# Patient Record
Sex: Female | Born: 1935 | Race: White | Hispanic: No | Smoking: Never smoker
Health system: Southern US, Community
[De-identification: ages and names within clinical notes are randomized; demographics above are authoritative.]

## PROBLEM LIST (undated history)

## (undated) DIAGNOSIS — D696 Thrombocytopenia, unspecified: Secondary | ICD-10-CM

## (undated) DIAGNOSIS — E871 Hypo-osmolality and hyponatremia: Secondary | ICD-10-CM

## (undated) DIAGNOSIS — N390 Urinary tract infection, site not specified: Secondary | ICD-10-CM

## (undated) DIAGNOSIS — J180 Bronchopneumonia, unspecified organism: Secondary | ICD-10-CM

## (undated) DIAGNOSIS — E785 Hyperlipidemia, unspecified: Secondary | ICD-10-CM

## (undated) DIAGNOSIS — I639 Cerebral infarction, unspecified: Secondary | ICD-10-CM

## (undated) DIAGNOSIS — K7460 Unspecified cirrhosis of liver: Secondary | ICD-10-CM

## (undated) DIAGNOSIS — E8770 Fluid overload, unspecified: Secondary | ICD-10-CM

## (undated) DIAGNOSIS — E7800 Pure hypercholesterolemia, unspecified: Secondary | ICD-10-CM

## (undated) DIAGNOSIS — Z87440 Personal history of urinary (tract) infections: Secondary | ICD-10-CM

## (undated) DIAGNOSIS — K8020 Calculus of gallbladder without cholecystitis without obstruction: Secondary | ICD-10-CM

## (undated) DIAGNOSIS — R410 Disorientation, unspecified: Secondary | ICD-10-CM

## (undated) DIAGNOSIS — Z8673 Personal history of transient ischemic attack (TIA), and cerebral infarction without residual deficits: Secondary | ICD-10-CM

## (undated) DIAGNOSIS — I2510 Atherosclerotic heart disease of native coronary artery without angina pectoris: Secondary | ICD-10-CM

## (undated) DIAGNOSIS — I10 Essential (primary) hypertension: Secondary | ICD-10-CM

## (undated) DIAGNOSIS — K7689 Other specified diseases of liver: Secondary | ICD-10-CM

## (undated) HISTORY — DX: Fluid overload, unspecified: E87.70

## (undated) HISTORY — DX: Pure hypercholesterolemia, unspecified: E78.00

## (undated) HISTORY — DX: Atherosclerotic heart disease of native coronary artery without angina pectoris: I25.10

## (undated) HISTORY — DX: Hyperlipidemia, unspecified: E78.5

## (undated) HISTORY — DX: Other specified diseases of liver: K76.89

## (undated) HISTORY — DX: Thrombocytopenia, unspecified: D69.6

## (undated) HISTORY — DX: Hypo-osmolality and hyponatremia: E87.1

## (undated) HISTORY — DX: Cerebral infarction, unspecified: I63.9

## (undated) HISTORY — DX: Urinary tract infection, site not specified: N39.0

## (undated) HISTORY — PX: OTHER SURGICAL HISTORY: SHX169

## (undated) HISTORY — PX: TENDON REPAIR: SHX5111

## (undated) HISTORY — DX: Personal history of transient ischemic attack (TIA), and cerebral infarction without residual deficits: Z86.73

## (undated) HISTORY — DX: Disorientation, unspecified: R41.0

## (undated) HISTORY — DX: Calculus of gallbladder without cholecystitis without obstruction: K80.20

## (undated) HISTORY — DX: Bronchopneumonia, unspecified organism: J18.0

## (undated) HISTORY — DX: Essential (primary) hypertension: I10

## (undated) HISTORY — DX: Personal history of urinary (tract) infections: Z87.440

---

## 1999-09-17 ENCOUNTER — Emergency Department (HOSPITAL_COMMUNITY): Admission: EM | Admit: 1999-09-17 | Discharge: 1999-09-17 | Payer: Self-pay | Admitting: Emergency Medicine

## 1999-09-18 ENCOUNTER — Emergency Department (HOSPITAL_COMMUNITY): Admission: EM | Admit: 1999-09-18 | Discharge: 1999-09-18 | Payer: Self-pay | Admitting: Emergency Medicine

## 2005-08-16 ENCOUNTER — Other Ambulatory Visit: Admission: RE | Admit: 2005-08-16 | Discharge: 2005-08-16 | Payer: Self-pay | Admitting: Family Medicine

## 2006-09-18 HISTORY — PX: CORONARY ARTERY BYPASS GRAFT: SHX141

## 2006-10-05 ENCOUNTER — Encounter: Payer: Self-pay | Admitting: Cardiology

## 2006-10-05 ENCOUNTER — Inpatient Hospital Stay (HOSPITAL_BASED_OUTPATIENT_CLINIC_OR_DEPARTMENT_OTHER): Admission: RE | Admit: 2006-10-05 | Discharge: 2006-10-05 | Payer: Self-pay | Admitting: Cardiology

## 2006-10-05 ENCOUNTER — Ambulatory Visit (HOSPITAL_COMMUNITY): Admission: RE | Admit: 2006-10-05 | Discharge: 2006-10-05 | Payer: Self-pay | Admitting: Cardiovascular Disease

## 2006-10-05 ENCOUNTER — Ambulatory Visit: Payer: Self-pay | Admitting: Vascular Surgery

## 2006-10-05 HISTORY — PX: CARDIAC CATHETERIZATION: SHX172

## 2006-10-08 ENCOUNTER — Ambulatory Visit: Payer: Self-pay | Admitting: Thoracic Surgery (Cardiothoracic Vascular Surgery)

## 2006-10-09 ENCOUNTER — Encounter
Admission: RE | Admit: 2006-10-09 | Discharge: 2006-10-09 | Payer: Self-pay | Admitting: Thoracic Surgery (Cardiothoracic Vascular Surgery)

## 2006-10-10 ENCOUNTER — Inpatient Hospital Stay (HOSPITAL_COMMUNITY)
Admission: RE | Admit: 2006-10-10 | Discharge: 2006-10-15 | Payer: Self-pay | Admitting: Thoracic Surgery (Cardiothoracic Vascular Surgery)

## 2006-10-10 ENCOUNTER — Encounter: Payer: Self-pay | Admitting: Cardiology

## 2006-11-05 ENCOUNTER — Ambulatory Visit: Payer: Self-pay | Admitting: Thoracic Surgery (Cardiothoracic Vascular Surgery)

## 2006-11-22 ENCOUNTER — Encounter (HOSPITAL_COMMUNITY): Admission: RE | Admit: 2006-11-22 | Discharge: 2007-02-20 | Payer: Self-pay | Admitting: Cardiology

## 2007-02-21 ENCOUNTER — Encounter (HOSPITAL_COMMUNITY): Admission: RE | Admit: 2007-02-21 | Discharge: 2007-03-20 | Payer: Self-pay | Admitting: Cardiology

## 2008-03-20 DIAGNOSIS — D696 Thrombocytopenia, unspecified: Secondary | ICD-10-CM

## 2008-03-20 HISTORY — DX: Thrombocytopenia, unspecified: D69.6

## 2008-04-16 ENCOUNTER — Encounter: Admission: RE | Admit: 2008-04-16 | Discharge: 2008-04-16 | Payer: Self-pay | Admitting: Family Medicine

## 2008-06-21 ENCOUNTER — Inpatient Hospital Stay (HOSPITAL_COMMUNITY): Admission: EM | Admit: 2008-06-21 | Discharge: 2008-06-24 | Payer: Self-pay | Admitting: Emergency Medicine

## 2008-08-20 IMAGING — CR DG CHEST 2V
2 series · 2 of 2 positions shown · non-contrast
Comparison: none

CLINICAL DATA: Coronary artery disease

[view not recorded (1 of 2)]
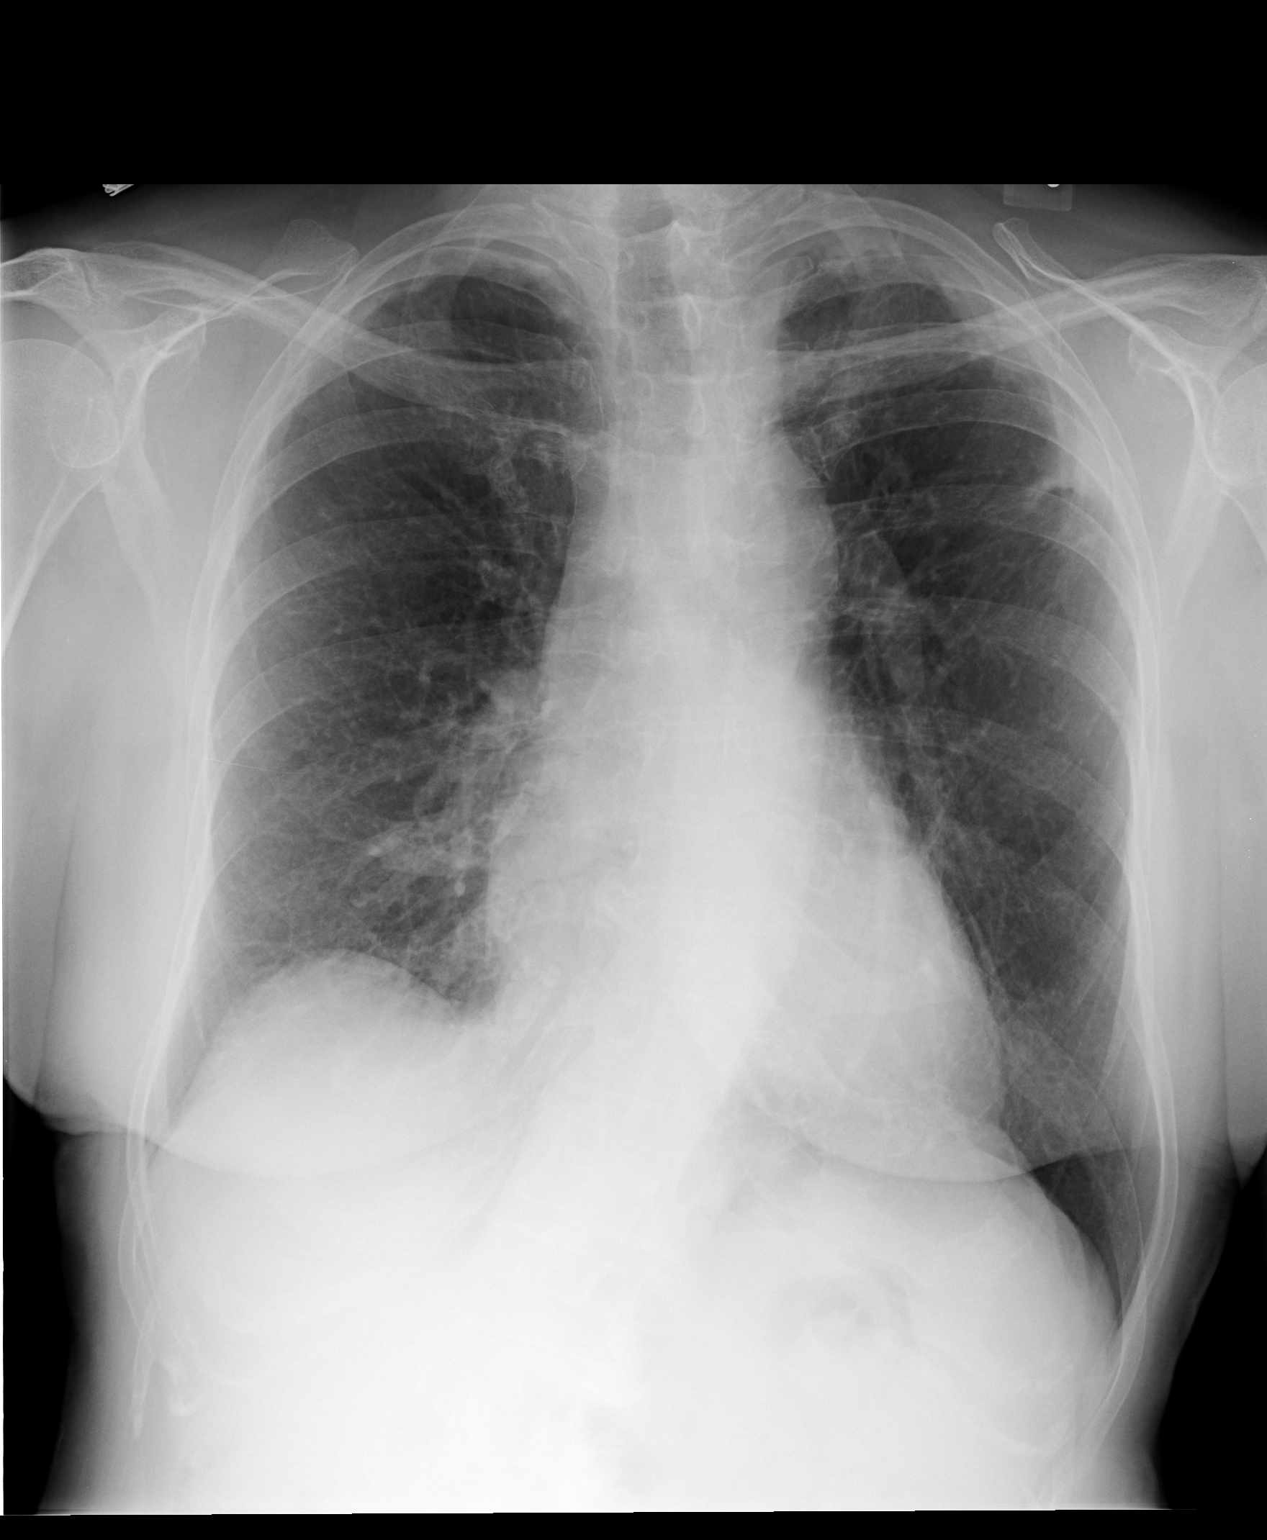

[view not recorded (2 of 2)]
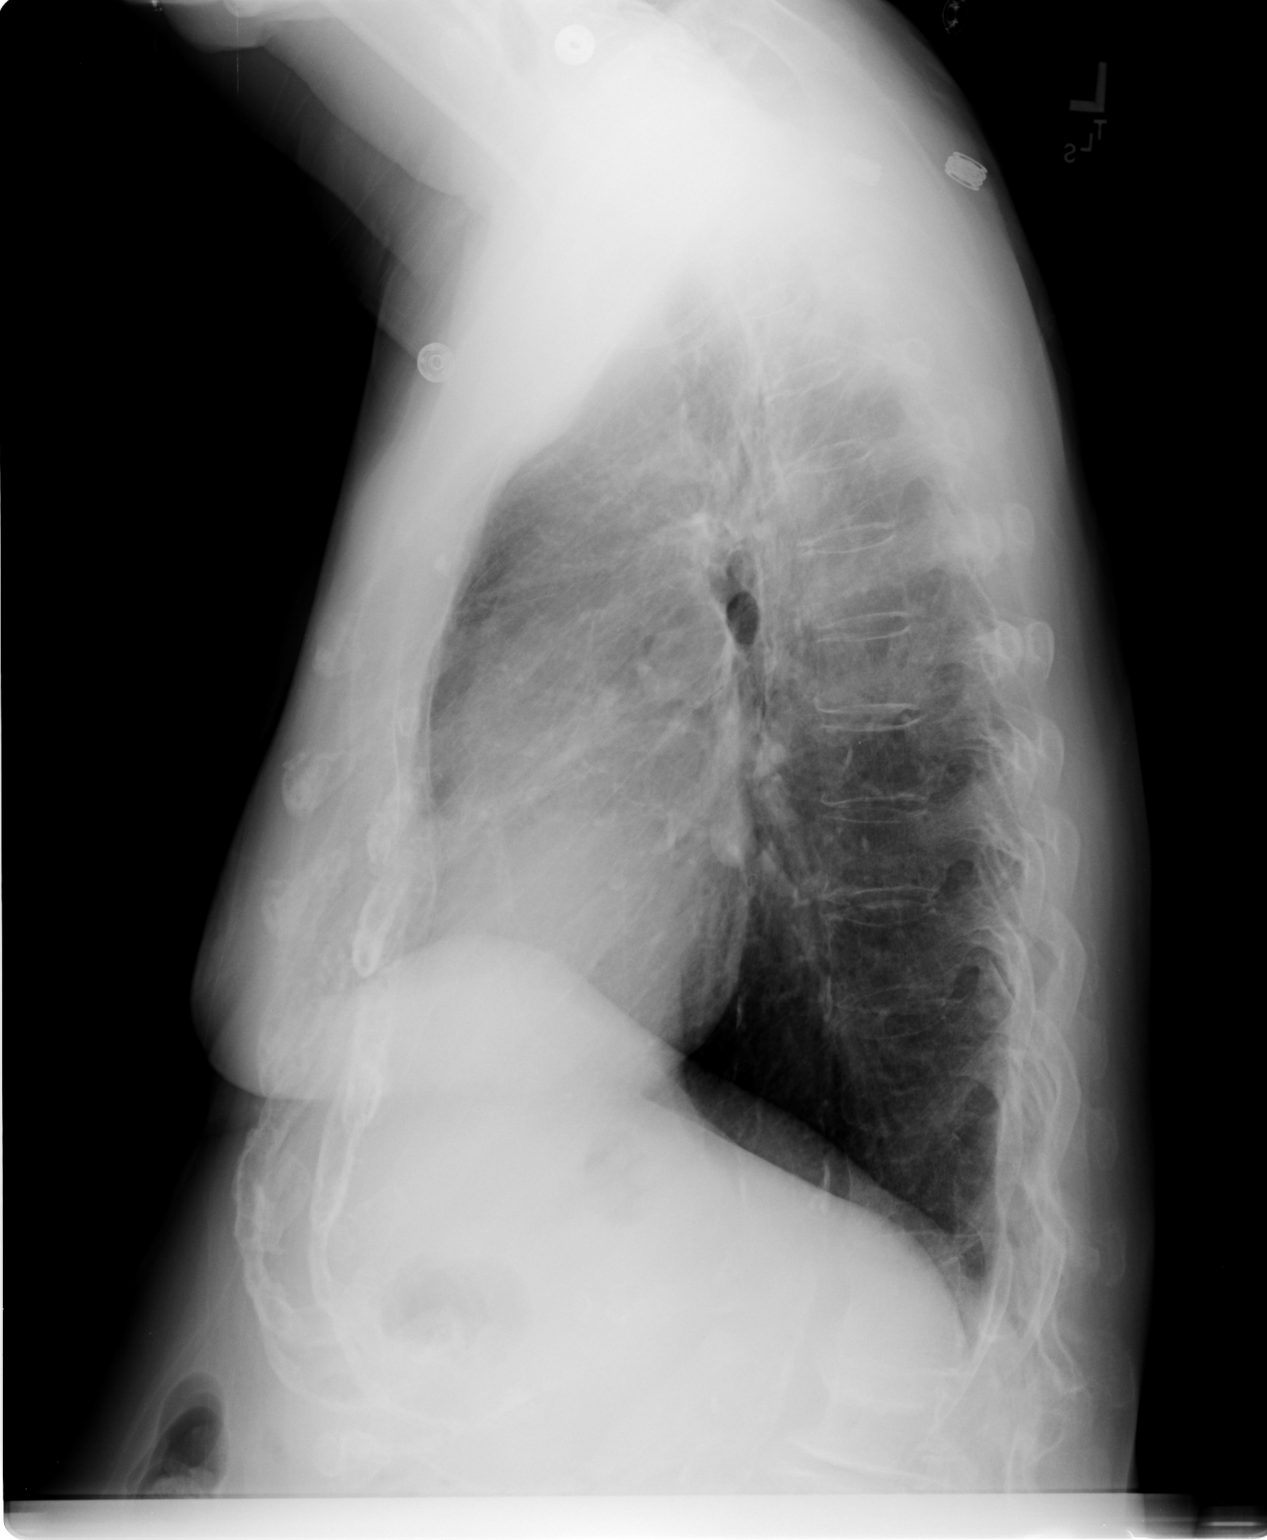

[2 of 2 positions shown; findings below may reference images not displayed]

Chest 2 view:

No previous available for comparison. There is asymmetric biapical
pleural-parenchymal thickening, left greater than right, with the process
extending along the lateral aspect of the left upper lobe with a focal more
nodular or masslike area at the level of the left 5th rib. Coarse
bronchovascular markings in the perihilar regions and lung bases. Heart size
upper limits normal. Mild levoscoliosis of the thoracic spine with degenerative
changes in the upper lumbar spine. Patchy aortic calcifications.
IMPRESSION: 1. Possible peripheral lung nodule in the left upper lobe versus asymmetric
pleural parenchymal scarring. In the absence of any previous films to
demonstrate stability, consider CT for further characterization to exclude
neoplasm.

## 2008-08-22 IMAGING — CR DG CHEST 1V PORT
1 series · 1 of 1 positions shown · non-contrast
Comparison: Preoperative chest CT 10/09/06.

CLINICAL DATA: Coronary artery disease, CABG, needle search.  
PORTABLE CHEST - 1 VIEW 10/10/06 AT 6628 HOURS:

[view not recorded]
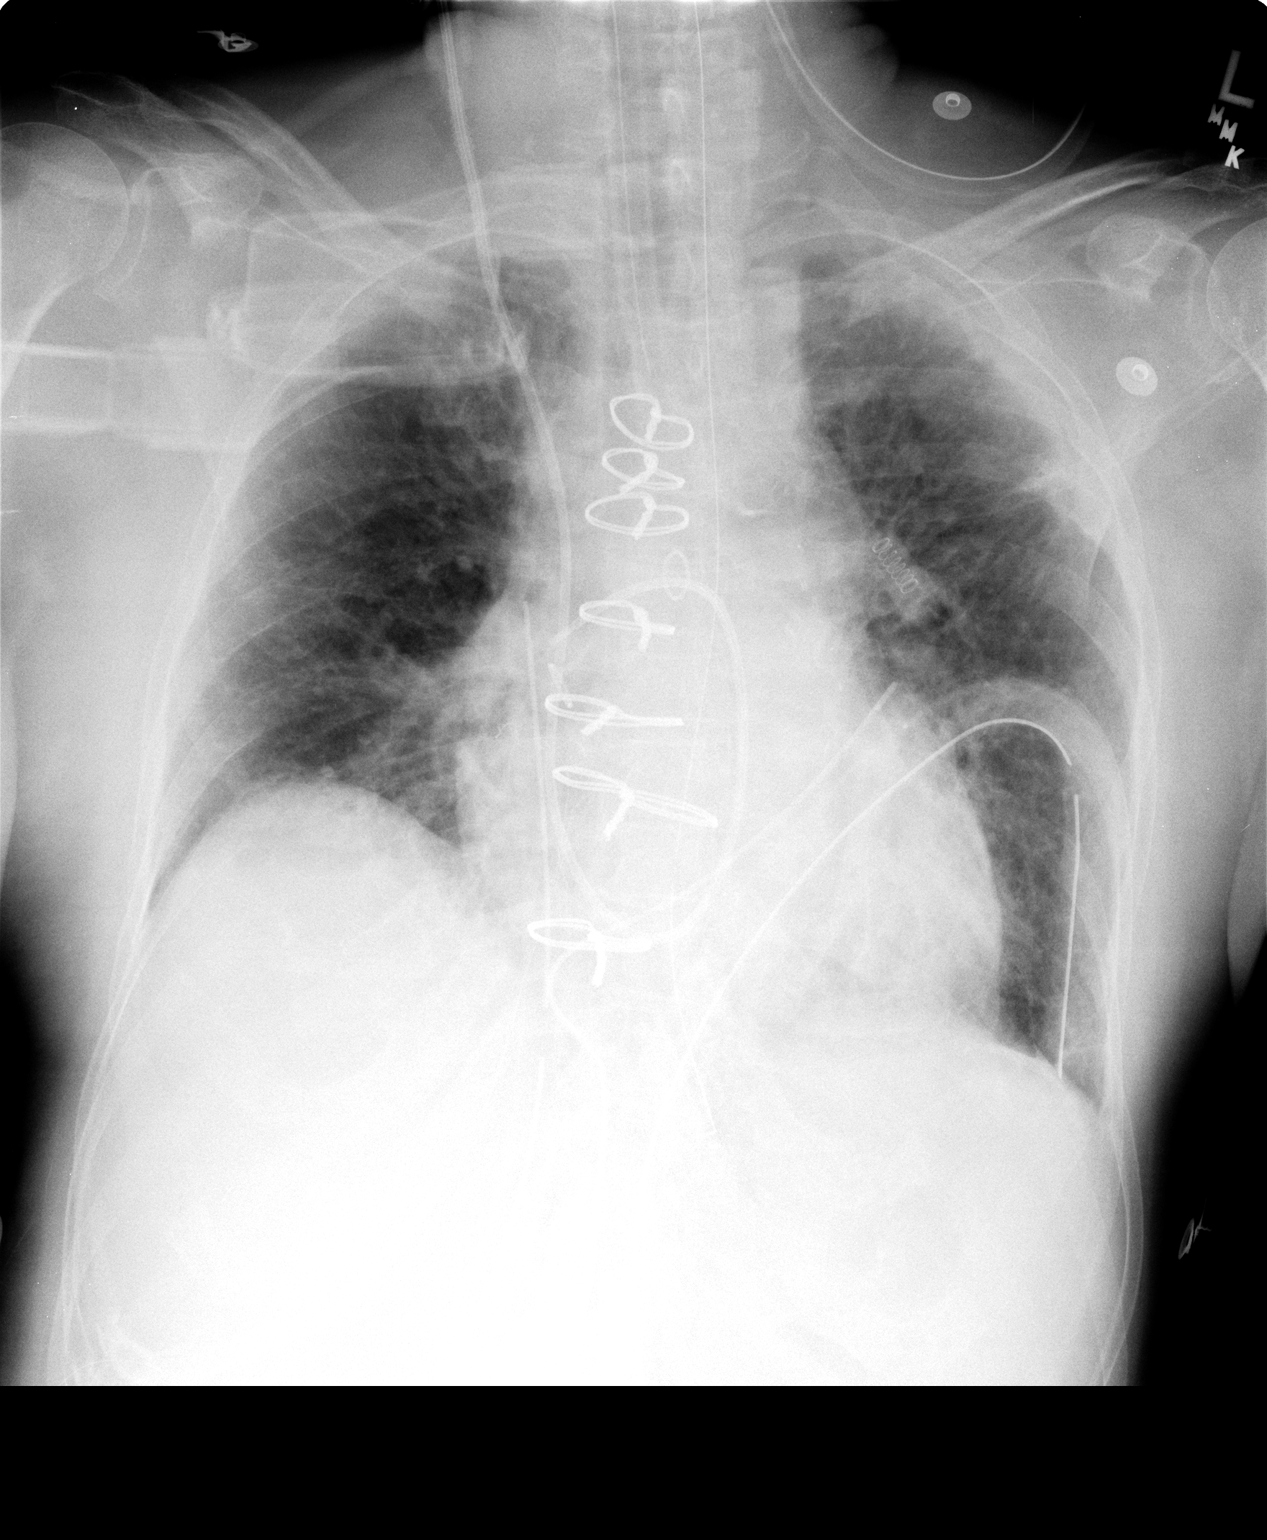

[1 of 1 positions shown; findings below may reference images not displayed]

FINDINGS: Endotracheal tube is present, with the tip approximately 2 cm from the carina.  Median sternotomy and CABG.  Right IJ Swan Ganz catheter with tip in the right pulmonary artery, heading into the descending pulmonary artery.  Nasogastric tube is present with the proximal side port in the distal esophagus.  This should be advanced 8 cm for better positioning within the stomach.  Mediastinal drains and left thoracostomy tube again noted.  Bilateral pulmonary parenchymal scarring and basilar atelectasis is present.  No focal airspace disease is identified.  
No metallic density is present suspicious for suture needles.
IMPRESSION: 1.  Support apparatus as described, with proximal side port of NG tube in distal esophagus.  This tube should be advanced for better positioning.  
2.  Status post median sternotomy/CABG.  No radiopaque foreign bodies are identified.

## 2008-08-23 IMAGING — CR DG CHEST 1V PORT
1 series · 1 of 1 positions shown · non-contrast
Comparison: One day prior

CLINICAL DATA: CABG.

CHEST - 1 VIEW

[view not recorded]
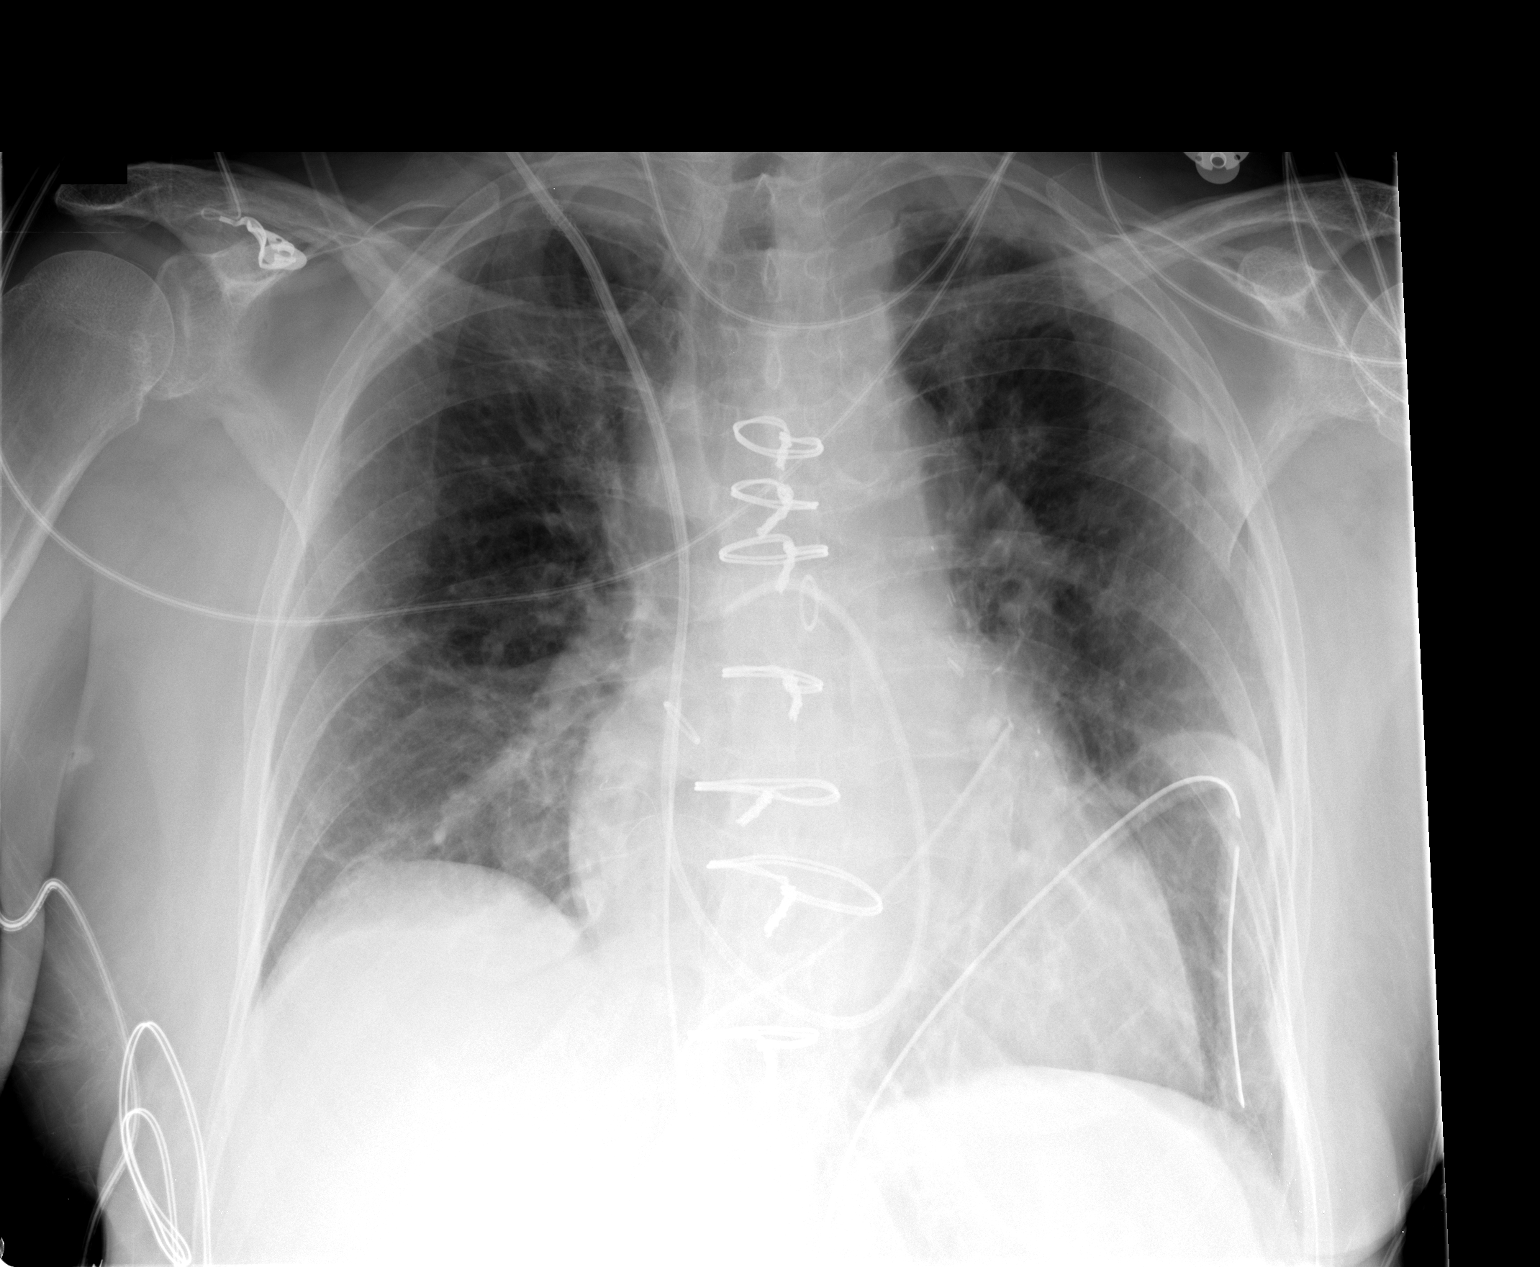

[1 of 1 positions shown; findings below may reference images not displayed]

FINDINGS: Interval extubation and removal of nasogastric tube. Median
sternotomy and CABG changes. Right IJ Swan-Ganz catheter unchanged in position.
Two left-sided chest tubes also unchanged. Left apical pleural thickening
without pneumothorax.

Mild cardiomegaly. Sharp costophrenic angles. Slightly decreased lung volumes
with minimal right base atelectasis.

IMPRESSION

1. Extubation with slight decrease in lung volumes.
2. Support apparatus appropriately positioned as above.
3. Mild cardiomegaly. No pneumothorax

## 2010-02-07 ENCOUNTER — Ambulatory Visit: Payer: Self-pay | Admitting: Cardiology

## 2010-04-10 ENCOUNTER — Encounter: Payer: Self-pay | Admitting: Thoracic Surgery (Cardiothoracic Vascular Surgery)

## 2010-06-29 LAB — CBC
HCT: 28.1 % — ABNORMAL LOW (ref 36.0–46.0)
HCT: 29.9 % — ABNORMAL LOW (ref 36.0–46.0)
HCT: 32.5 % — ABNORMAL LOW (ref 36.0–46.0)
HCT: 34.5 % — ABNORMAL LOW (ref 36.0–46.0)
Hemoglobin: 11.2 g/dL — ABNORMAL LOW (ref 12.0–15.0)
Hemoglobin: 11.8 g/dL — ABNORMAL LOW (ref 12.0–15.0)
Hemoglobin: 9.7 g/dL — ABNORMAL LOW (ref 12.0–15.0)
MCHC: 34.1 g/dL (ref 30.0–36.0)
MCHC: 34.5 g/dL (ref 30.0–36.0)
MCV: 94.1 fL (ref 78.0–100.0)
MCV: 94.2 fL (ref 78.0–100.0)
MCV: 94.3 fL (ref 78.0–100.0)
MCV: 94.4 fL (ref 78.0–100.0)
Platelets: 92 10*3/uL — ABNORMAL LOW (ref 150–400)
Platelets: 95 10*3/uL — ABNORMAL LOW (ref 150–400)
RBC: 3.45 MIL/uL — ABNORMAL LOW (ref 3.87–5.11)
RDW: 12.4 % (ref 11.5–15.5)
RDW: 13 % (ref 11.5–15.5)
WBC: 7.7 10*3/uL (ref 4.0–10.5)

## 2010-06-29 LAB — URINE CULTURE: Colony Count: >=100000

## 2010-06-29 LAB — DIFFERENTIAL
Basophils Absolute: 0 10*3/uL (ref 0.0–0.1)
Basophils Absolute: 0 10*3/uL (ref 0.0–0.1)
Basophils Absolute: 0 10*3/uL (ref 0.0–0.1)
Basophils Relative: 0 % (ref 0–1)
Eosinophils Relative: 0 % (ref 0–5)
Eosinophils Relative: 2 % (ref 0–5)
Lymphocytes Relative: 22 % (ref 12–46)
Lymphocytes Relative: 8 % — ABNORMAL LOW (ref 12–46)
Lymphs Abs: 0.5 10*3/uL — ABNORMAL LOW (ref 0.7–4.0)
Lymphs Abs: 0.9 10*3/uL (ref 0.7–4.0)
Monocytes Absolute: 0.5 10*3/uL (ref 0.1–1.0)
Monocytes Absolute: 0.5 10*3/uL (ref 0.1–1.0)
Monocytes Absolute: 0.5 10*3/uL (ref 0.1–1.0)
Neutro Abs: 5.2 10*3/uL (ref 1.7–7.7)

## 2010-06-29 LAB — POCT I-STAT, CHEM 8
Calcium, Ion: 1.11 mmol/L — ABNORMAL LOW (ref 1.12–1.32)
Glucose, Bld: 154 mg/dL — ABNORMAL HIGH (ref 70–99)
HCT: 35 % — ABNORMAL LOW (ref 36.0–46.0)
TCO2: 22 mmol/L (ref 0–100)

## 2010-06-29 LAB — TROPONIN I: Troponin I: 0.02 ng/mL (ref 0.00–0.06)

## 2010-06-29 LAB — BASIC METABOLIC PANEL
BUN: 22 mg/dL (ref 6–23)
Chloride: 105 mEq/L (ref 96–112)
GFR calc Af Amer: 51 mL/min — ABNORMAL LOW (ref >60)
GFR calc non Af Amer: 42 mL/min — ABNORMAL LOW (ref >60)
Potassium: 4 mEq/L (ref 3.5–5.1)
Sodium: 137 mEq/L (ref 135–145)

## 2010-06-29 LAB — URINALYSIS, ROUTINE W REFLEX MICROSCOPIC
Bilirubin Urine: NEGATIVE
Glucose, UA: NEGATIVE mg/dL
Nitrite: POSITIVE — AB
Protein, ur: NEGATIVE mg/dL

## 2010-06-29 LAB — CULTURE, BLOOD (ROUTINE X 2): Culture: NO GROWTH

## 2010-06-29 LAB — CARDIAC PANEL(CRET KIN+CKTOT+MB+TROPI)
CK, MB: 4.8 ng/mL — ABNORMAL HIGH (ref 0.3–4.0)
Relative Index: 2.8 — ABNORMAL HIGH (ref 0.0–2.5)

## 2010-06-29 LAB — CK TOTAL AND CKMB (NOT AT ARMC): Total CK: 100 U/L (ref 7–177)

## 2010-08-02 NOTE — Consult Note (Signed)
NEW PATIENT CONSULTATION   Anne Hanna, Anne Hanna  DOB:  [birth year]                                        October 08, 2006  CHART #:  [id number]   DATE [facility] ADMISSION:  October 10, 2006.   REASON FOR CONSULTATION:  Severe three-vessel coronary artery disease.   HISTORY OF PRESENT ILLNESS:  Anne Hanna is an 77 year old widowed white  female from [place] with no previous history of coronary artery  disease and risk factors notable for a history of hypertension,  hypercholesterolemia, and the presence of several family members with  coronary artery disease.  The patient has remained fairly active  physically and has been remarkably well most of her life.  She notes a  several-week history of progressive symptoms of exertional fatigue as  well as occasional episodes of mild aching pain in her left arm or both  arms that initially was brought on with physical activity such as  walking.  Approximately one week ago she had an episode of similar pain  that was more severe that occurred at rest and lasted for approximately  20 minutes.  This prompted her to seek attention with Dr. Arvilla Market, who  subsequently referred her to Dr. Swaziland.  She underwent a stress test  that was abnormal, and subsequently she underwent a cardiac  catheterization by Dr. Swaziland on July 18.  She was found to have severe  three-vessel coronary artery disease with moderate left ventricular  dysfunction.  Cardiac surgical consultation has been requested to  consider surgical revascularization.   REVIEW OF SYSTEMS:  GENERAL:  The patient reports normal appetite.  She  has not been gaining or losing weight recently.  CARDIAC:  Notable for  progressive symptoms of dull, aching pain that typically occurs in the  left shoulder and left upper arm and occasionally both arms.  This  initially was brought on with activity and relieved with rest.  The  patient denies any pain in her chest and she  denies any associated  symptoms of shortness of breath, nausea, diaphoresis.  Yesterday she  took her garbage cans out and had a similar episode of pain across both  arms.  She took a sublingual nitroglycerin and the pain promptly went  away.  She denies any shortness of breath either with activity or at  rest.  She denies orthopnea, lower extremity edema, palpitations, or  syncope.  RESPIRATORY:  Negative.  The patient denies productive cough,  hemoptysis, or wheezing.  GASTROINTESTINAL:  Negative.  The patient  denies difficulty swallowing.  She reports no hematochezia, hematemesis,  melena.  GENITOURINARY:  Negative.  PERIPHERAL VASCULAR:  Negative.  MUSCULOSKELETAL:  Negative.  The patient reports mild arthralgias in the  fingers of both hands.  NEUROLOGIC:  Negative.  The patient denies  symptoms suggestive of previous TIA or stroke.  PSYCHIATRIC:  Negative.  HEENT:  Negative.  The patient has full-set upper and lower dentures.  HEMATOLOGIC:  Negative.  The patient denies bleeding diathesis or easy  bruising.   PAST MEDICAL HISTORY:  1. Hypertension.  2. Hypercholesterolemia.  3. Glaucoma.   PAST SURGICAL HISTORY:  Bilateral cataract extraction and repair of  tendon on the left index finger.   FAMILY HISTORY:  The patient's brother had bypass surgery and ultimately  died in his 41s.  There is no  history of premature coronary artery  disease.  The patient's mother did have coronary disease as well.   SOCIAL HISTORY:  The patient is widowed and lives alone here in  [place].  She has five children and numerous grandchildren.  Her  family is very supportive.  She remains very active physically and  independent.  She is a nonsmoker.  She denies significant alcohol  consumption.   CURRENT MEDICATIONS:  1. Toprol XL 100 mg daily.  2. Aspirin 325 mg daily.  3. Glaucoma eye drops (medication and strength unknown).   DRUG ALLERGIES:  None known.   PHYSICAL EXAMINATION:  The  patient is a well-appearing female who  appears somewhat younger than stated age, in no acute distress.  Blood pressure 162/72, pulse 72, oxygen saturation 98% on room air.  HEENT:  Grossly unremarkable.  NECK:  Supple.  There is no cervical nor supraclavicular  lymphadenopathy.  There is no jugular venous distention.  No carotid  bruits are noted.  Auscultation of the chest demonstrates clear breath sounds, which are  symmetrical bilaterally.  No wheezes or rhonchi are demonstrated.  CARDIOVASCULAR:  Regular rate and rhythm.  No murmurs, rubs or gallops  are noted.  ABDOMEN:  Soft, nontender.  Bowel sounds are present.  There are no  palpable masses.  EXTREMITIES:  Warm and well-perfused.  There is no lower extremity  edema.  Distal pulses are diminished in both lower legs at the ankle.  RECTAL AND GENITOURINARY:  Exams are both deferred.  NEUROLOGIC:  Grossly nonfocal and symmetrical throughout.  SKIN:  Clean, dry, and healthy-appearing throughout.   DIAGNOSTIC TESTS:  Cardiac catheterization performed by Dr. Swaziland July  18 is reviewed.  This demonstrates severe, diffuse three-vessel coronary  artery disease with heavy calcification within many of the coronary  arteries.  Specifically, there is long-segment 70-80% proximal stenosis  of the left anterior descending coronary artery with 80-90% discrete  stenosis in the midportion of this vessel.  The first two diagonal  branches of the left anterior descending coronary artery are quite  small.  The third diagonal branch is large, although it does not have  significant disease within it.  The distal left anterior descending  coronary artery is also somewhat diffusely diseased and may be  intramyocardial.  There is 70% proximal stenosis of the left circumflex  coronary artery with 80-90% stenosis of the mid left circumflex coronary  artery at the bifurcation of the large circumflex marginal branch.  There is 100% proximal occlusion of  the right coronary artery with left-  to-right collateral filling of the distal right coronary artery and the  posterior descending coronary artery.  The posterior descending coronary  artery appears diffusely diseased.  There is inferobasal akinesis with  ejection fraction estimated at 40%.  There does not appear to be any  mitral regurgitation.  No other abnormalities are noted.   IMPRESSION:  Severe three-vessel coronary artery disease with moderate  left ventricular dysfunction.  Coronary anatomy is unfavorable for  percutaneous coronary intervention.  There is diffuse disease with heavy  calcification within the vessels, and distal target vessels appear  graftable although they may also be somewhat diffusely diseased,  particularly the posterior descending coronary artery.  Under the  circumstances, I do feel that Ms. Yingst would best be treated with  surgical revascularization.  She otherwise appears to be a fairly good  candidate for surgery despite her advanced age.   PLAN:  I have discussed options at length  with Ms. Fernicola and one of  her daughters here in the office today.  Alternative treatment  strategies have been discussed.  They understand and accept all  associated risks of surgery, including but not limited to risk of death,  stroke, myocardial infarction, congestive heart failure, respiratory  failure, pneumonia, bleeding requiring blood transfusion, arrhythmia,  infection, and recurrent coronary artery disease.  All their questions  have been addressed.  We tentatively plan to proceed with surgery on  Wednesday, July 23.   Salvatore Decent. Cornelius Moras, M.D.  Electronically Signed   CHO/MEDQ  D:  10/08/2006  T:  10/08/2006  Job:  [id number]   cc:   Peter M. Swaziland, M.D.  Donia Guiles, M.D.

## 2010-08-02 NOTE — Discharge Summary (Signed)
Anne Hanna, Anne Hanna                ACCOUNT NO.:  [id number]   MEDICAL RECORD NO.:  [id number]          PATIENT TYPE:  INP   LOCATION:  1413                         FACILITY:  [facility]   PHYSICIAN:  Kela Millin, M.D.DATE OF BIRTH:  May 06, 1922   DATE OF ADMISSION:  06/21/2008  DATE OF DISCHARGE:  06/24/2008                               DISCHARGE SUMMARY   DISCHARGE DIAGNOSES:  1. Right lung bronchopneumonia.  2. Escherichia coli urinary tract infection.  3. Acute sinusitis.  4. Thrombocytopenia - platelet count of 96 at discharge (95 on      admission).  5. History of coronary artery disease status post coronary artery      bypass grafting in 2008.  6. Hypertension.  7. Hyperlipidemia.  8. History of transient ischemic attack/?small strokes.  9. History of glaucoma.  10.History of hyperlipidemia.  11.History of postoperative volume overload.  12.Hyponatremia/volume depletion - resolved.   PROCEDURES AND STUDIES:  1. Chest x-ray - chronic and postoperative changes as above without      acute superimposed abnormality.  2. CT angiogram of chest - negative for pulmonary embolus.  Patchy      airspace disease throughout the right lung compatible with      multifocal bronchopneumonia.  Gallstones noted.  3. CT scan of sinuses - left maxillary, ethmoid and left frontal      sinusitis.   BRIEF HISTORY:  The patient is a pleasant 77 year old white female with  above-listed medical problems who presented with complaints of fevers  and cough.  She admitted to pleuritic chest pain and stated that the  cough had worsened over a 1 week period and she also had postnasal drip  and nasal congestion.  She was seen in the ED and a chest x-ray was done  which was negative for acute infiltrates, D-dimer was found to be  elevated at 0.87 and a follow-up CT angiogram showed findings consistent  with bronchopneumonia and negative for PE.  She was admitted for further  evaluation and  management.   Please see the full dictated admission history and physical for the  details of the admission physical exam as well as the laboratory data.   HOSPITAL COURSE:  1. Right lung bronchopneumonia - as discussed above, upon admission      she was febrile and had blood cultures drawn and was empirically      started on antibiotics for a community acquired pneumonia.  Blood      cultures show no growth to date.  She also was placed on      expectorants, antitussives and antihistamines.  She responded well      to those interventions.  She defervesced and has remained afebrile      throughout this hospital stay, and her symptoms have improved.  She      is ambulating without difficulty and her O2 sats were checked after      walking and they are within normal limits at 95%.  She has remained      hemodynamically stable and will be discharged home on oral  antibiotics and she is to follow up with her primary care physician      Dr. Arvilla Market.  2. E. coli urinary tract infection - because of her fevers on      presentation a urinalysis was also done on admission and it was      consistent with a UTI.  She was placed on empiric antibiotics and      the urine cultures came back with E. coli which was sensitive to      cephalosporins she was on.  She has improved clinically, remaining      afebrile with no leukocytosis and she is to continue antibiotics      upon discharge as above.  3. Acute sinusitis - of the left maxillary, ethmoid and left frontal      sinuses.  She was started on antihistamines as well as antibiotics      during her hospital stay.  She has remained afebrile, some      improvement of her congestion.  She is to continue the      antihistamines and antibiotics upon discharge.  4. Thrombocytopenia - she was noted to have a platelet count of 95 on      admission with no gross bleeding, no bruisability, and it was noted      that her last platelet count in 2008 per  eChart was 198.      Impression is her infections were likely contributing factor.  Her      platelet count has remained stable and on recheck today prior to      discharge it is 96 and she has not had any gross bleeding and has      improved overall clinically.  I have discussed this with patient      and she is to follow up with her PCP for recheck of the platelet      count and further evaluation to be considered outpatient pending      this recheck on outpatient follow-up.  5. Hypertension - the patient was maintained on her outpatient      medications and is to continue this upon discharge.  6. History of hyperlipidemia - she was maintained on Crestor and is to      continue this upon discharge.  7. History of TIA/strokes - she was maintained on her Plavix during      her hospital stay.  She is to follow up with her PCP and      neurologist following recheck of the platelet counts as noted above      regarding further recommendations on her Plavix.  8. Hyponatremia/volume depletion - resolved with hydration.  Her last      sodium prior to discharge 137.  9. History of coronary artery disease and status post CABG - she      remained chest pain free during this hospitalization and she is to      continue her outpatient medications upon discharge.   DISCHARGE MEDICATIONS:  1. Ceftin 500 mg one p.o. b.i.d. x7 more days.  2. Zithromax 250 mg p.o. daily for 2 more days.  3. Mucinex DM 2 p.o. b.i.d. for cough.  4. Tussionex 5 mL p.o. nightly for cough.  5. Claritin-D 10 mg p.o. daily.  6. Norvasc 5 mg p.o. daily.  7. Ramipril 10 mg p.o. daily.  8. Crestor 10 mg p.o. daily.  9. Metoprolol ER 100 mg p.o. daily.  10.Plavix 75 mg p.o. daily.  11.Istalol 1  drop daily as previously.   FOLLOW-UP CARE:  1. Dr. Donia Guiles in 1 - 2 weeks.  2. Neurologist, call for follow-up appointment.   DISCHARGE CONDITION:  Improved/stable.      Kela Millin, M.D.  Electronically  Signed     ACV/MEDQ  D:  06/24/2008  T:  06/24/2008  Job:  [id number]   cc:   Donia Guiles, M.D.  Fax: [phone]   [facility]

## 2010-08-02 NOTE — Assessment & Plan Note (Signed)
OFFICE VISIT   Anne Hanna, Anne Hanna  DOB:  [birth year]                                        November 05, 2006  CHART #:  [id number]   HISTORY OF PRESENT ILLNESS:  The patient returns for a routine followup  status post coronary artery bypass grafting x4 on October 10, 2006. Her  postoperative recovery has been uneventful. Following hospital  discharge, she had continued to recover. She was seen in followup by Dr.  Swaziland last week and she returned to our office for a routine followup  today. Overall, the patient reports feeling quite well. She has had very  little, if any, pain in her chest whatsoever. She denies any shortness  of breath. She has not had any pain in her leg. Her appetite has been  slow to recover, but otherwise she feels fine. She has no complaints.   MEDICATIONS:  At this time, include only aspirin, Crestor, and Toprol  XL.   PHYSICAL EXAMINATION:  Is notable for a well-appearing female with blood  pressure of 141/78, pulse 82, oxygen saturation is 98% on room air.  Examination of the chest reveals a median sternotomy incision that is  healing nicely. The sternum is stable on palpation. Breath sounds are  clear to auscultation and symmetrical bilaterally. No wheezes or rhonchi  are demonstrated. CARDIOVASCULAR: Includes regular rate and rhythm. No  murmurs, rubs or gallops are noted. ABDOMEN: Soft and nontender.  EXTREMITIES: Warm and well perfused. The small incision from right thigh  and lower extremity vein harvest has healed well. There is no lower  extremity edema. The remainder of her physical examination is  unremarkable.   DIAGNOSTIC TESTS:  Chest x-ray obtained August 12 is reviewed. This  demonstrates clear lung fields bilaterally. There are no significant  pleural effusions. All the sternal wires appear intact. No other  abnormalities are noted.   IMPRESSION:  Satisfactory progress following recent coronary artery  bypass  grafting.   PLAN:  I have encouraged the patient to continue to gradually increase  her activity as tolerated with her only limitation at this point  remaining that she refrain from heavy lifting or strenuous use of her  arms and shoulders for another two months. I think it is safe for her to  resume driving an automobile. I have encouraged her to get started in  cardiac rehab program. All of her questions have been addressed. In the  future she will call or return to see [place] her at [facility] only should further problems or difficulties arise.   Salvatore Decent. Cornelius Moras, M.D.  Electronically Signed   CHO/MEDQ  D:  11/05/2006  T:  11/05/2006  Job:  [id number]   cc:   Peter M. [place], M.D.  Donia Guiles, M.D.

## 2010-08-02 NOTE — Cardiovascular Report (Signed)
NAME:  Anne Hanna, LOCHRIDGE NO.:  [id number]   MEDICAL RECORD NO.:  [id number]          PATIENT TYPE:  OUT   LOCATION:  VASC                         FACILITY:  [facility]   PHYSICIAN:  Peter M. Swaziland, M.D.  DATE OF BIRTH:  1922/05/25   DATE OF PROCEDURE:  10/05/2006  DATE OF DISCHARGE:                            CARDIAC CATHETERIZATION   INDICATIONS FOR PROCEDURE:  An 77 year old white female with recent  onset of angina.  She had a markedly abnormal stress Cardiolite study.  She has a history of hypertension and hypercholesterolemia.   PROCEDURES:  1. Left heart catheterization.  2. Coronary and left ventricular angiography.   ACCESS:  Via right femoral artery using standard Seldinger technique.   EQUIPMENT:  4-French, 4 cm left Judkins catheter, 4-French 3-D RC  catheter, 4-French pigtail catheter, 4-French arterial sheath.   MEDICATIONS:  Local anesthesia 1% Xylocaine.   CONTRAST:  90 mL of Omnipaque.   HEMODYNAMIC DATA:  Aortic pressure of 159/67 with mean of 102.  Left  ventricular pressure is 157 with EDP of 11 mmHg.   ANGIOGRAPHIC DATA:  The left coronary artery arises and distributes  normally.  The left main coronary is without significant disease.   The left anterior descending artery is heavily calcified.  There is  diffuse 80-90% disease in the proximal LAD.  There is then a more focal  90% stenosis in the mid LAD with some irregular plaque.  The first  diagonal is without significant disease.   The left circumflex coronary is moderately calcified.  There is an 80%  stenosis proximally.  There is then 90% stenosis at the bifurcation of  the first and second obtuse marginal vessels, which arise more distally  on the circumflex.   The right coronary arises normally.  It is severely calcified.  It is  occluded proximally.  There are good left-to-right collaterals to the  entire right coronary artery.   Left ventricular angiography was performed in  the RAO view.  This  demonstrates normal left ventricular size.  There is moderate global  hypokinesia with overall ejection fraction estimated at 40%.  There is  no significant mitral insufficiency.   FINAL INTERPRETATION:  1. Severe three-vessel obstructive atherosclerotic coronary disease.  2. Moderate left ventricular dysfunction.   PLAN:  Would recommend revascularization with coronary artery bypass  surgery.           ______________________________  Peter M. [place]il[place], M.D.     PMJ/MEDQ  D:  10/05/2006  T:  10/05/2006  Job:  [id number]   cc:   Donia Guiles, M.D.

## 2010-08-02 NOTE — H&P (Signed)
NAMEREWA, WEISSBERG                ACCOUNT NO.:  [id number]   MEDICAL RECORD NO.:  [id number]          PATIENT TYPE:  INP   LOCATION:  0103                         FACILITY:  [facility]   PHYSICIAN:  Kela Millin, M.D.DATE OF BIRTH:  26-Jun-1922   DATE OF ADMISSION:  06/21/2008  DATE OF DISCHARGE:                              HISTORY & PHYSICAL   PRIMARY CARE PHYSICIAN:  Donia Guiles, M.D.   CHIEF COMPLAINT:  Fevers and cough.   HISTORY OF PRESENT ILLNESS:  The patient is an 77 year old white female  with a history of coronary artery disease and status post CABG in 2008,  hypertension, hyperlipidemia, glaucoma, history of TIA/? small strokes,  also history of postoperative volume overload who presents with above  complaints.  She states that she has had postnasal drip as well as nasal  congestion along with a cough for the past week.  Beginning last night  her cough worsened and has been occasionally productive of small  yellowish phlegm and she has also had pleuritic chest pain.  She began  having fevers last night and this morning had an episode of nausea and  vomiting and was brought to the ED.  She denies shortness of breath, leg  swelling, dysuria, diarrhea, melena, no hematochezia.  She also denies  orthopnea and no PND.   She was seen in the ED and a chest x-ray was done which was negative for  acute infiltrate or abnormalities.  A D-dimer was done which was  elevated at 0.87.  White count now is 7.7 and she is admitted for  further evaluation and management.   The patient also had CT angiogram done in ED which showed patchy air  space disease throughout the right lung compatible with multifocal  bronchopneumonia.   PAST MEDICAL HISTORY:  As above.   MEDICATIONS:  1. Norvasc 5 mg daily.  2. Crestor 10 mg daily.  3. Metoprolol ER 100 mg daily.  4. Plavix 75 mg daily.  5. Ramipril 10 mg daily.  6. Tylenol p.r.n.  7. Istalol 0.5% eye drop to each eye daily.   ALLERGIES:  NKDA.   SOCIAL HISTORY:  She denies tobacco.  Has a drink of scotch daily.   FAMILY HISTORY:  Reviewed and noncontributory to current illness.   PHYSICAL EXAMINATION:  GENERAL:  The patient is a pleasant elderly white  female in no respiratory distress.  VITAL SIGNS:  Temperature is 100.7, max in ER 102.5, blood pressure  129/43, pulse 83, respiratory rate 20, O2 sat of 91%.  HEENT:  PERRL, EOMI.  Sclerae anicteric.  Slightly dry mucous membranes.  No oral exudates.  NECK:  No adenopathy, no thyromegaly, no JVD.  LUNGS:  Bilateral rhonchi, no wheezes.  CARDIOVASCULAR:  Regular rate and rhythm.  Normal S1, S2.  No S3  appreciated.  ABDOMEN:  Soft.  Bowel sounds present.  Nontender.  Nondistended.  No  organomegaly or masses palpable.  EXTREMITIES:  No cyanosis and no edema.  NEUROLOGICAL:  She is alert and oriented x3.  Cranial nerves II-XII were  grossly intact. Non focal.   LABORATORY  DATA:  As per HPI.  Also, her sodium is 133, potassium 3.8,  chloride 99, BUN 15, glucose 154, ionized calcium 1.11, hemoglobin 7.9,  hematocrit of 35.  White cell count 7.7, platelet count 97.   CT angiogram of chest  - negative for PE.  Patchy air space disease  throughout the right lung compatible with multifocal bronchopneumonia.  Also gallstone noted.   ASSESSMENT AND PLAN:  1. Bronchopneumonia, multifocal on right side - will obtain blood      cultures, start on empiric antibiotics.  Also add expectorants and      antihistamines.  2. Hyponatremia/volume depletion - hydrate and recheck, consider      further evaluation if not improving.  3. History of coronary artery disease and status post coronary artery      bypass graft - obtain EKG, also check cardiac enzymes.  4. Hypertension - continue outpatient medications with hold      parameters.  5. History of glaucoma - continue outpatient medication.  6. History of transient ischemic attack/? small stroke - continue       Plavix.  7. Thrombocytopenia - possibly secondary to infection, no gross      bleeding.  Follow and recheck and evaluate as appropriate.      Kela Millin, M.D.  Electronically Signed     ACV/MEDQ  D:  06/21/2008  T:  06/21/2008  Job:  [id number]   cc:   Donia Guiles, M.D.  Fax: [phone]

## 2010-08-02 NOTE — Discharge Summary (Signed)
Anne Hanna, Anne Hanna                ACCOUNT NO.:  [id number]   MEDICAL RECORD NO.:  [id number]          PATIENT TYPE:  INP   LOCATION:  2023                         FACILITY:  [facility]   PHYSICIAN:  Salvatore Decent. Cornelius Moras, M.D. DATE OF BIRTH:  [birth year]   DATE OF ADMISSION:  10/10/2006  DATE OF DISCHARGE:  10/15/2006                               DISCHARGE SUMMARY   FINAL DIAGNOSIS:  Severe three-vessel coronary artery disease.   IN-HOSPITAL DIAGNOSES:  1. Postoperative delirium.  2. Postoperative volume overload.  3. Acute blood loss anemia postoperatively.   SECONDARY DIAGNOSES:  1. Hypertension.  2. Hyperlipidemia.  3. Glaucoma.  4. Bilateral cataract extraction.  5. Status post repair of tendon of the left index finger.   IN-HOUSE OPERATIONS AND PROCEDURES:  Coronary artery bypass grafting  time four using a left internal mammary artery to distal left anterior  descending coronary artery, saphenous vein graft to first circumflex  marginal branch of sequential saphenous vein graft to second circumflex  marginal branch, saphenous vein graft to posterior descending coronary  artery.  Endoscopic saphenous vein harvesting from right side.   HOSPITAL COURSE:  The patient is an 77 year old widowed, white female  from [place] with no previous history of coronary artery disease, but  risk factors notable for history of hypertension, hyperlipidemia.  The  patient has a family history with several family members with coronary  artery disease.  The patient presented with several week history with  exertional pain under her left arm.  The patient was sent to Dr. Peter  [place] and underwent a stress test which showed to be abnormal.  The  patient was then taken for cardiac catheterization October 05, 2006.  She  was found to have severe three-vessel coronary artery disease with  moderate left ventricular dysfunction.  Dr. Cornelius Moras was consulted following  catheterization.  Dr. Cornelius Moras saw and  evaluated the patient.  He suggested  that the patient undergo coronary artery bypass grafting.  Risks and  benefits were discussed.  The patient verbalized understanding and  agreed to proceed.  Surgery was scheduled for October 10, 2006.   For details of the patient's past medical history and physical exam  please see dictated H and P.   The patient was taken to the operating room October 10, 2006, where she  underwent coronary artery bypass grafting times four using a left  internal mammary artery to the left anterior descending, saphenous vein  graft to first circumflex marginal branch with sequential saphenous vein  graft to second circumflex marginal branch, saphenous vein graft to  posterior descending artery.  Endoscopic saphenous vein harvest from  right thigh done.  The patient tolerated this procedure well and  transferred to the intensive care unit in stable condition.  Postoperatively the patient was noted to be hemodynamically stable.  She  was extubated the evening of surgery.  Post extubation the patient noted  to be alert and oriented x4.  Neuro intact.  Postoperative day one, the  patient was able to be weaned from all drips.  Her vital signs were  noted to be  stable.  Swans-Ganz catheter was DC.  Postoperative chest x-  ray was clear with minimal drainage from chest tubes.  Chest tubes were  DC postop day #1.  The patient did have mild acute blood loss anemia  with a 9.6 and 28% hematocrit.  The patient was asymptomatic.  This was  followed closely.  She did not require any transfusions and this  remained stable during her postoperative course.  Evening of postop day  #1, the patient developed delirium effusion.  Questionable due to  narcotics.  She was given Haldol in a.m.  The patient's confusion and  delirium improved and she was alert and oriented x4.  The patient  continued to progress well.  She was out of bed ambulating well with  rehab.  Vital signs remained stable.   She was transferred up to 2000  postop day #2.  The patient was continued on diuretics for volume  overload.  Daily weights were obtained and the patient was noted to be  improving and back near baseline weight prior to discharge.  Her  pulmonary status, she did require several days of nasal cannula for O2.  She continued to use her incentive spirometer.  Chest x-rays remained  stable. Prior to discharge the patient was able to be weaned off oxygen  saturating  greater than 90% on room air.  The patient was noted to have  remained in normal sinus rhythm postoperatively.  She did have 15 beats  of V-tach prior to discharge.  Her beta blocker was increased.  The  patient remained in normal sinus rhythm for remainder of the time.  Her  incisions were clean, dry and intact and healing well.  She continued to  progress well with ambulation.  She was tolerating diet well.  No  nausea, vomiting noted.   The patient was ready for discharge home October 15, 2006, in stable  condition.   FOLLOW-UP APPOINTMENTS:  A follow-up appointment will be arranged with  Dr. Cornelius Moras for in three weeks.  Our office will contact the patient with  this information.  The patient will need to obtain chest x-ray 30  minutes prior to this appointment.  The patient will need to follow up  with Dr. [place]il[place] in two weeks.  She will need to contact Dr. Elvis Coil  office to make these arrangements.   ACTIVITY:  Patient instructed no driving to released to do so, no  lifting over 10 pounds.  The patient told to ambulate three to four  times per day, progress as tolerated and continue her breathing  exercises.   INCISIONAL CARE:  The patient told to shower washing her incisions using  soap and water.  She is to contact the office if she develops any  drainage or opening from any of her incision sites.   DIET:  The patient's diet to be low-fat, low-salt.   DISCHARGE MEDICATIONS:  1. Aspirin 325 mg daily.  2. Timolol eye  drop both eyes daily.  3. Crestor 10 mg daily.  4. Lasix 40 mg daily times seven days.  5. Potassium Chloride 20 mEq daily times seven days.  6. Toprol XL 100 mg daily.  7. Ultram 50 mg 1-2 tablets q-4 to 6 hours p.r.n. pain.      Theda Belfast, PA      Salvatore Decent. Cornelius Moras, M.D.  Electronically Signed    KMD/MEDQ  D:  11/16/2006  T:  11/17/2006  Job:  [id number]   cc:   Theron Arista  M. [place], M.D.

## 2010-08-02 NOTE — H&P (Signed)
NAMEBOBBYE, PETTI NO.:  [id number]   MEDICAL RECORD NO.:  [id number]           PATIENT TYPE:   LOCATION:                                 FACILITY:   PHYSICIAN:  Peter M. [place], M.D.  DATE OF BIRTH:  03/25/1922   DATE OF ADMISSION:  10/05/2006  DATE OF DISCHARGE:                              HISTORY & PHYSICAL   HISTORY OF PRESENT ILLNESS:  Ms. Welte is a 77 year old white female  who was seen initially for evaluation of left arm pain.  Approximately a  week ago the patient began experiencing pain in her left shoulder area  radiating down her left arm.  She states it just hurt and lasted 10 or  15 minutes then resolved, but recurred later in the evening and lasted  approximately 20 minutes.  She had no associated chest pain, jaw pain,  shortness of breath, diaphoresis or nausea.  She was seen the next day  and ECG did not show any acute  ST changes.  She had had no further  chest pain.  At that time her Toprol dose was increased to 100 mg per  day.  She was on aspirin daily.  We scheduled her for a stress  Cardiolite study which was significantly abnormal.  The patient only  walked a little over 3 minutes.  She had significant ST-segment changes  on her ECG and frequent ventricular ectopy.  Her Cardiolite images  demonstrate a significant inferior and apical ischemia.  Ejection  fraction was reduced at 39%.  Given her high-risk study it was  recommended she undergo cardiac catheterization.   PAST MEDICAL HISTORY:  1. Hypertension.  2. Hypercholesterolemia.  3. Glaucoma.  She has also had prior cataract surgery x2.   ALLERGIES:  The patient states she is intolerant to LIPITOR due to  headache.   MEDICATIONS:  Include aspirin 325 mg per day, Toprol XL 100 mg per day,  Crestor 10 mg per day and her eye drops daily.   SOCIAL HISTORY:  The patient is retired.  She previously worked in her  Water engineer in the office.  She has never smoked.  She  does  drink one scotch per day.  She is widowed.  She has five healthy  children.   FAMILY HISTORY:  Both parents died their 36s with congestive heart  failure.  One brother has a history of bleeding ulcers.  One brother  passed away at age 61 with heart disease.  Another brother died of  unknown causes at age 69.  Three sisters are in good health.   REVIEW OF SYSTEMS:  Is otherwise unremarkable.   PHYSICAL EXAM:  The patient is pleasant white female in no distress.  Weight is 137, blood pressure is 114/70, pulse 72 and regular.  She has  normal respirations.  HEENT: Exam normocephalic, atraumatic.  Pupils were equal, round,  reactive to light and accommodation.  Extraocular movements were full.  Oropharynx is clear.  Neck is without JVD, adenopathy, thyromegaly or  bruits.  LUNGS were clear to auscultation and percussion.  CARDIAC: Exam  reveals a regular rate and rhythm without gallop, murmur,  rub or click.  ABDOMEN is soft, nontender without masses or bruits.  EXTREMITIES: Without edema.  Pulses are 2+ and symmetric.  NEUROLOGIC EXAM is nonfocal.   LABORATORY DATA:  Resting ECG shows normal sinus rhythm.  There is mild  ST-T wave changes consistent with inferolateral ischemia.  Chest x-ray  shows scoliosis with no active disease.  Initial laboratory data:  Glucose was 113, BUN 22, creatinine 1.0,  sodium 140, potassium 5.9, chloride 102, CO2 27, calcium 9.5.  White  count 5800, hemoglobin 10.8, hematocrit 33.1, platelets 198,000.  Coags  were pending.   IMPRESSION:  1. Recent left arm pain.  The patient has a high-risk stress      Cardiolite study indicating inferior apical ischemia with moderate      left ventricular dysfunction.  2. Hypertension.  3. Hypercholesterolemia.  4. Glaucoma.   PLAN:  Proceed with diagnostic cardiac catheterization with further  therapy pending these results.           ______________________________  Peter M. [place], M.D.     PMJ/MEDQ   D:  10/02/2006  T:  10/03/2006  Job:  [id number]   cc:   Donia Guiles, M.D.

## 2010-08-02 NOTE — Op Note (Signed)
NAMESUZETTE, [name]                ACCOUNT NO.:  [id number]   MEDICAL RECORD NO.:  [id number]          PATIENT TYPE:  INP   LOCATION:  2306                         FACILITY:  [facility]   PHYSICIAN:  Salvatore Decent. Anne Hanna, M.D. DATE OF BIRTH:  Aug 03, 1922   DATE OF PROCEDURE:  10/10/2006  DATE OF DISCHARGE:                               OPERATIVE REPORT   PREOPERATIVE DIAGNOSIS:  Severe three-vessel coronary artery disease.   POSTOPERATIVE DIAGNOSIS:  Severe three-vessel coronary artery disease.   PROCEDURE:  Median sternotomy for coronary artery bypass grafting x4  (left internal mammary artery to distal left anterior descending  coronary artery, saphenous vein graft to first circumflex marginal  branch with sequential saphenous vein graft to second circumflex  marginal branch, saphenous vein graft to posterior descending coronary  artery, endoscopic saphenous vein harvest from right thigh).   SURGEON:  Dr. Purcell Nails.   ASSISTANT:  Dr. Charlett Lango.   ANESTHESIA:  General.   BRIEF CLINICAL NOTE:  The patient is an 77 year old widowed white female  from [place] with no previous history of coronary artery disease but  risk factors notable for history of hypertension, hyperlipidemia, and  several family members with coronary artery disease.  The patient  presents with a several week history of exertional pain in her left arm.  The pain progressed ultimately with an episode of similar pain occurring  at rest that prompted evaluation.  The patient was referred to Dr. Peter  [place] and a stress test was performed and was notably abnormal.  She  subsequently underwent cardiac catheterization on July 18.  She was  found to have severe three-vessel coronary artery disease with moderate  left ventricular dysfunction.  A full consultation has been dictated  previously.  The patient and her family have been counseled at length  regarding the indications, risks, and potential benefits  of surgery.  Alternative treatment strategies have been discussed.  They understand  and accept all associated risks of surgery and desire to proceed as  described.   OPERATIVE FINDINGS:  1. Mild left ventricular dysfunction with ejection fraction estimated      45-50%.  2. Mild mitral regurgitation.  3. Small caliber left internal mammary artery with good flow.  4. Satisfactory saphenous vein conduit for grafting.  5. Small but satisfactory target vessels for grafting with exception      of the posterior descending coronary artery which was diffusely      diseased and a poor target.   OPERATIVE NOTE IN DETAIL:  The patient is brought to the operating room  on the above-mentioned date and placed in the supine position on the  operating table.  General endotracheal anesthesia is induced  uneventfully under the care and direction of Dr. Jairo Ben.  Central monitoring is established including the Swan-Ganz catheter  placed through the right internal jugular approach.  A radial arterial  line is placed.  Intravenous antibiotics were administered.  Following  induction with general endotracheal anesthesia, baseline transesophageal  echocardiogram was performed.  This demonstrates mild left ventricular  dysfunction with mild mitral regurgitation.  No  other abnormalities are  noted.  The patient's chest, abdomen, both groins, and both lower  extremities are prepared, draped in sterile manner.   A median sternotomy incision is performed and the left internal mammary  artery was dissected from the chest wall and prepared for bypass  grafting.  Of note, there were old adhesions in the left pleural space  from presumed remote history of pneumonia in the distant past.  The left  internal mammary artery is somewhat small caliber but adequate conduit  for grafting.  Simultaneously saphenous vein was obtained the patient's  right thigh using endoscopic vein harvest technique through a  small  incision made just below the right knee.  The saphenous vein is  satisfactory conduit for grafting.  After the saphenous vein is removed,  the small incision in the right lower extremity is closed in multiple  layers with running absorbable suture.  The patient is heparinized  systemically and the left internal mammary artery transected distally.  It is noted to have good flow.   The pericardium is opened.  The ascending aorta is mildly diseased with  atherosclerosis.  The ascending aorta and the right atrium were  cannulated for cardiopulmonary bypass.  Adequate heparinization is  verified.  Cardiopulmonary bypass is begun and the surface of the heart  was inspected.  Distal sites are selected for coronary bypass grafting.  A temperature probe is placed left ventricular septum.  A cardioplegic  catheter is placed in the ascending aorta.   The patient is allowed to cool passively to 32 degrees systemic  temperature.  The aortic crossclamp was applied and cold blood  cardioplegia is administered initially in antegrade fashion through the  aortic root.  Iced saline slush was applied for topical hypothermia.  The initial cardioplegic arrest, myocardial cooling are felt to be  excellent.  Repeat doses of cardioplegia are administered intermittently  throughout the crossclamp portion of the operation through the aortic  root and down the subsequently placed vein grafts to maintain left  ventricular septal temperature below 15 degrees centigrade.   The following distal coronary anastomoses were performed:  1. The first circumflex marginal branch is grafted with a saphenous      vein graft in side-to-side fashion.  This vessel measured 1.4 mm in      diameter and is a fair to good quality target vessel at the site of      distal grafting.  2. The second circumflex marginal branch is grafted using a sequential      saphenous vein graft off of the vein placed to the first circumflex       marginal branch.  This vessel measures 1.5 mm in diameter and is a      fair to good quality target vessel for grafting.  3. The posterior descending coronary artery is grafted with saphenous      vein graft in end-to-side fashion.  This vessel was diffusely      diseased with chronic occlusion proximally.  It is a poor target      vessel for grafting.  It is grafted just beyond the bifurcation of      the distal right coronary artery.  There is known high-grade      stenosis beyond this level, but beyond the distal high-grade      stenosis, there is no suitable site for grafting.  A 1.0 probe will      pass a short distance in both directions.  4. The  distal left anterior descending coronary artery is grafted with      left internal mammary artery in end-to-side fashion.  This vessel      measured 1.5 mm in diameter and is a good-quality target vessel for      grafting.   Both proximal saphenous vein anastomoses were performed directly to the  ascending aorta prior to removal of the aortic crossclamp.  The left  ventricular septal temperature rises appropriately with reperfusion of  the left internal mammary artery.  The aortic crossclamp was removed  after total crossclamp time of 80 minutes.  The heart began to beat  spontaneously without need for cardioversion.  All proximal and distal  coronary anastomoses were inspected for hemostasis and appropriate graft  orientation.  Epicardial pacing wires were fixed to the right  ventricular outflow tract and to the right atrial appendage.  The  patient is rewarmed to 37 degrees centigrade temperature.  The patient  weaned from cardiopulmonary bypass without difficulty.  The patient's  rhythm at separation from bypass and is a slow junctional escape rhythm.  AV sequential pacing is employed.  No inotropic support is required.  Total cardiopulmonary bypass time the operation is 95 minutes.  Follow-  up transesophageal echocardiogram  performed by Dr. Jean Rosenthal after  separation from bypass demonstrates no significant change in left  ventricular function.  Initially mitral regurgitation appears slightly  increased but within a few minutes following separation from bypass  there is trivial mitral regurgitation.  No other abnormalities are  noted.   The venous and arterial cannulae are removed uneventfully.  Protamine is  administered to reverse the anticoagulation.  The mediastinum and left  chest are irrigated with saline solution containing vancomycin.  Meticulous surgical hemostasis ascertained.  The mediastinum and left  chest are drained with three chest tubes exited through separate stab  incisions inferiorly.  The soft tissues and pericardium anterior to the  aorta are reapproximated loosely.  The sternum was closed with double-  strength sternal wire.  The soft tissues anterior to the sternum are  closed in multiple layers and the skin is closed with running  subcuticular skin closure.   The patient tolerated the procedure well and was transported to surgical  intensive care unit in stable condition.  There are no intraoperative  complications.  All sponge and  instrument counts were verified correct  at completion of the operation.  The patient was transfused 3 units  packed red blood cells during cardiopulmonary bypass due to anemia which  was present prior to surgery and exacerbated by hemodilution.      Salvatore Decent. Anne Hanna, M.D.  Electronically Signed     CHO/MEDQ  D:  10/10/2006  T:  10/11/2006  Job:  [id number]   cc:   Peter M. [place], M.D.  Donia Guiles, M.D.

## 2010-08-03 ENCOUNTER — Other Ambulatory Visit: Payer: Self-pay | Admitting: Cardiology

## 2010-08-03 MED ORDER — CLOPIDOGREL BISULFATE 75 MG PO TABS: 75.0000 mg | ORAL_TABLET | Freq: Every day | ORAL | Status: DC

## 2010-08-03 NOTE — Telephone Encounter (Signed)
Called requesting refill on Plavix. Usually gets from Dr. Thad Ranger but he has moved and doesn't want to see him again. To see Dr. Swaziland 5/22. Sent to [facility].

## 2010-08-03 NOTE — Telephone Encounter (Signed)
Called wanting to speak with you about her Plavix prescription. Please call back. I have pulled the chart.

## 2010-08-04 ENCOUNTER — Other Ambulatory Visit: Payer: Self-pay | Admitting: *Deleted

## 2010-08-04 MED ORDER — ROSUVASTATIN CALCIUM 10 MG PO TABS: 10.0000 mg | ORAL_TABLET | Freq: Every day | ORAL | Status: DC

## 2010-08-04 NOTE — Telephone Encounter (Signed)
escribe medication per fax request  

## 2010-08-05 ENCOUNTER — Encounter: Payer: Self-pay | Admitting: Cardiology

## 2010-08-05 DIAGNOSIS — E7800 Pure hypercholesterolemia, unspecified: Secondary | ICD-10-CM | POA: Insufficient documentation

## 2010-08-05 DIAGNOSIS — I2510 Atherosclerotic heart disease of native coronary artery without angina pectoris: Secondary | ICD-10-CM | POA: Insufficient documentation

## 2010-08-05 DIAGNOSIS — I10 Essential (primary) hypertension: Secondary | ICD-10-CM | POA: Insufficient documentation

## 2010-08-05 DIAGNOSIS — I639 Cerebral infarction, unspecified: Secondary | ICD-10-CM | POA: Insufficient documentation

## 2010-08-05 DIAGNOSIS — Z87440 Personal history of urinary (tract) infections: Secondary | ICD-10-CM | POA: Insufficient documentation

## 2010-08-05 DIAGNOSIS — H409 Unspecified glaucoma: Secondary | ICD-10-CM | POA: Insufficient documentation

## 2010-08-09 ENCOUNTER — Ambulatory Visit (INDEPENDENT_AMBULATORY_CARE_PROVIDER_SITE_OTHER): Payer: Medicare Other | Admitting: Cardiology

## 2010-08-09 ENCOUNTER — Encounter: Payer: Self-pay | Admitting: Cardiology

## 2010-08-09 DIAGNOSIS — I10 Essential (primary) hypertension: Secondary | ICD-10-CM

## 2010-08-09 DIAGNOSIS — E7800 Pure hypercholesterolemia, unspecified: Secondary | ICD-10-CM

## 2010-08-09 DIAGNOSIS — I2510 Atherosclerotic heart disease of native coronary artery without angina pectoris: Secondary | ICD-10-CM

## 2010-08-09 NOTE — Assessment & Plan Note (Signed)
Excellent control on Crestor. We will continue same therapy and followup labs in 6 months.

## 2010-08-09 NOTE — Progress Notes (Signed)
   Anne Hanna Date of Birth: 07-Sep-1922   History of Present Illness: Anne Hanna is seen today for followup of her coronary disease. She is feeling very well. She continues to walk 25-30 minutes a day with her dog. She denies any chest pain or shortness of breath. She denies any palpitations or dizziness.  Current Outpatient Prescriptions on File Prior to Visit  Medication Sig Dispense Refill  . clopidogrel (PLAVIX) 75 MG tablet Take 1 tablet (75 mg total) by mouth daily.  30 tablet  5  . hydrochlorothiazide 25 MG tablet Take 25 mg by mouth daily.        . Nebivolol HCl (BYSTOLIC PO) Take 10 mg by mouth daily.       . nitroGLYCERIN (NITROSTAT) 0.4 MG SL tablet Place 0.4 mg under the tongue every 5 (five) minutes as needed.        . rosuvastatin (CRESTOR) 10 MG tablet Take 1 tablet (10 mg total) by mouth at bedtime.  30 tablet  5  . Timolol Maleate (ISTALOL OP) Apply to eye daily.        Marland Kitchen DISCONTD: ramipril (ALTACE) 10 MG tablet Take 10 mg by mouth daily.          Allergies  Allergen Reactions  . Lipitor (Atorvastatin Calcium)     HEADACHES    Past Medical History  Diagnosis Date  . Coronary artery disease     STATUS POST CABG  . Hypertension   . Hypercholesterolemia   . CVA (cerebral vascular accident)     OCULAR CVA  . Hx: UTI (urinary tract infection)   . Glaucoma     Past Surgical History  Procedure Date  . Cardiac catheterization 10/05/2006    NORMAL LEFT VENTRICULAR SIZE. THERE IS MODERATE GLOBAL HYPOKINESIA WITH OVERALL EF 40%  . Coronary artery bypass graft 09/2006    LIMA GRAFT TO THE LAD, SAPHENOUS VEIN GRAFT TO THE FIRST OBTUSE MARGINAL VESSEL WITH SEQUENTIAL GRAFT TO THE SECOND MARGINAL VESSEL , AND SAPHENOUS VEIN GRAFT TO THE PDA  . Cataract surgery     History  Smoking status  . Never Smoker   Smokeless tobacco  . Not on file    History  Alcohol Use No    Family History  Problem Relation Age of Onset  . Heart failure Mother   . Heart  failure Father     Review of Systems:   All other systems were reviewed and are negative.  Physical Exam: BP 156/72  Pulse 58  Ht 5\' 5"  (1.651 m)  Wt 122 lb 9.6 oz (55.611 kg)  BMI 20.40 kg/m2 She is a pleasant white female in no acute distress. HEENT exam is unremarkable. She has no JVD or bruits. Lungs are clear. Cardiac exam reveals a regular rate and rhythm without gallop, murmur, or click. She has no edema. Pedal pulses are good. Blood pressure was repeated and is unchanged. LABORATORY DATA:   Assessment / Plan:

## 2010-08-09 NOTE — Assessment & Plan Note (Signed)
Blood pressure is elevated today where as previously her blood pressure was under good control. I have asked her to monitor her blood pressure at home and if it remains over 140 systolic she is to call me back and we will adjust her medications. She is to avoid sodium.

## 2010-08-09 NOTE — Assessment & Plan Note (Signed)
She is asymptomatic. She is on chronic antiplatelet therapy with Plavix. Her last lipid panel in November showed excellent levels. I have encouraged her to continue with her aerobic activity.

## 2010-08-09 NOTE — Patient Instructions (Signed)
Monitor blood pressure at home. Call me if your top number stays more than 140 mmHG.  I will see you back in 6 months for follow up.  Continue your current medications.

## 2010-12-23 ENCOUNTER — Encounter: Payer: Self-pay | Admitting: Cardiology

## 2010-12-30 ENCOUNTER — Encounter: Payer: Self-pay | Admitting: Cardiology

## 2011-01-02 LAB — POCT I-STAT 4, (NA,K, GLUC, HGB,HCT)
Glucose, Bld: 110 — ABNORMAL HIGH
Glucose, Bld: 111 — ABNORMAL HIGH
HCT: 20 — ABNORMAL LOW
HCT: 20 — ABNORMAL LOW
HCT: 23 — ABNORMAL LOW
HCT: 25 — ABNORMAL LOW
HCT: 25 — ABNORMAL LOW
Hemoglobin: 6.8 — CL
Hemoglobin: 7.8 — CL
Hemoglobin: 8.5 — ABNORMAL LOW
Hemoglobin: 8.5 — ABNORMAL LOW
Operator id: 3406
Operator id: 3406
Operator id: 3406
Operator id: 3406
Potassium: 4.1
Potassium: 5
Potassium: 6.1 — ABNORMAL HIGH
Sodium: 132 — ABNORMAL LOW
Sodium: 135

## 2011-01-02 LAB — BASIC METABOLIC PANEL
BUN: 19
BUN: 21
CO2: 25
CO2: 28
CO2: 28
Calcium: 8.3 — ABNORMAL LOW
Calcium: 9
Chloride: 107
Chloride: 98
Chloride: 98
Creatinine, Ser: 1.05
GFR calc Af Amer: 45 — ABNORMAL LOW
GFR calc Af Amer: >60
GFR calc non Af Amer: 37 — ABNORMAL LOW
GFR calc non Af Amer: 41 — ABNORMAL LOW
GFR calc non Af Amer: 50 — ABNORMAL LOW
GFR calc non Af Amer: 55 — ABNORMAL LOW
Glucose, Bld: 137 — ABNORMAL HIGH
Glucose, Bld: 166 — ABNORMAL HIGH
Glucose, Bld: 174 — ABNORMAL HIGH
Potassium: 4.3
Potassium: 4.4
Potassium: 4.5
Potassium: 4.5
Sodium: 131 — ABNORMAL LOW
Sodium: 135
Sodium: 135
Sodium: 140

## 2011-01-02 LAB — CBC
HCT: 26.4 — ABNORMAL LOW
HCT: 26.9 — ABNORMAL LOW
HCT: 27.5 — ABNORMAL LOW
HCT: 28.2 — ABNORMAL LOW
HCT: 29.7 — ABNORMAL LOW
HCT: 31.4 — ABNORMAL LOW
HCT: 33.4 — ABNORMAL LOW
Hemoglobin: 10.4 — ABNORMAL LOW
Hemoglobin: 11.3 — ABNORMAL LOW
Hemoglobin: 8.9 — ABNORMAL LOW
Hemoglobin: 9.2 — ABNORMAL LOW
Hemoglobin: 9.4 — ABNORMAL LOW
Hemoglobin: 9.6 — ABNORMAL LOW
MCHC: 33.7
MCHC: 33.8
MCV: 85.4
MCV: 87
MCV: 87.4
MCV: 87.6
Platelets: 100 — ABNORMAL LOW
Platelets: 112 — ABNORMAL LOW
Platelets: 124 — ABNORMAL LOW
RBC: 3.15 — ABNORMAL LOW
RBC: 3.52 — ABNORMAL LOW
RDW: 13.8
RDW: 14.3 — ABNORMAL HIGH
RDW: 14.3 — ABNORMAL HIGH
RDW: 14.6 — ABNORMAL HIGH
WBC: 6
WBC: 7.5
WBC: 9.6

## 2011-01-02 LAB — TYPE AND SCREEN

## 2011-01-02 LAB — URINALYSIS, ROUTINE W REFLEX MICROSCOPIC
Glucose, UA: NEGATIVE
Ketones, ur: NEGATIVE
Protein, ur: NEGATIVE

## 2011-01-02 LAB — POCT I-STAT 3, ART BLOOD GAS (G3+)
Acid-base deficit: 1
Acid-base deficit: 3 — ABNORMAL HIGH
Bicarbonate: 22.7
Bicarbonate: 24.2 — ABNORMAL HIGH
O2 Saturation: 100
O2 Saturation: 100
O2 Saturation: 94
O2 Saturation: 95
Patient temperature: 36.1
TCO2: 22
TCO2: 24
TCO2: 25
pCO2 arterial: 33.4 — ABNORMAL LOW
pCO2 arterial: 33.7 — ABNORMAL LOW
pCO2 arterial: 37.6
pH, Arterial: 7.417 — ABNORMAL HIGH
pH, Arterial: 7.434 — ABNORMAL HIGH
pO2, Arterial: 168 — ABNORMAL HIGH
pO2, Arterial: 325 — ABNORMAL HIGH
pO2, Arterial: 69 — ABNORMAL LOW

## 2011-01-02 LAB — I-STAT EC8
Acid-base deficit: 2
Chloride: 105
HCT: 30 — ABNORMAL LOW
Operator id: 157281
TCO2: 23
pCO2 arterial: 33.8 — ABNORMAL LOW

## 2011-01-02 LAB — I-STAT 8, (EC8 V) (CONVERTED LAB)
BUN: 29 — ABNORMAL HIGH
Bicarbonate: 24.9 — ABNORMAL HIGH
Glucose, Bld: 135 — ABNORMAL HIGH
Hemoglobin: 10.5 — ABNORMAL LOW
Sodium: 136
TCO2: 26
pH, Ven: 7.278

## 2011-01-02 LAB — MAGNESIUM
Magnesium: 2
Magnesium: 3.1 — ABNORMAL HIGH

## 2011-01-02 LAB — PREPARE FRESH FROZEN PLASMA

## 2011-01-02 LAB — POCT I-STAT 7, (LYTES, BLD GAS, ICA,H+H)
Calcium, Ion: 0.9 — ABNORMAL LOW
HCT: 24 — ABNORMAL LOW
Hemoglobin: 8.2 — ABNORMAL LOW
Operator id: 3406
Potassium: 5.1
pCO2 arterial: 35.3
pH, Arterial: 7.416 — ABNORMAL HIGH

## 2011-01-02 LAB — COMPREHENSIVE METABOLIC PANEL
Alkaline Phosphatase: 65
BUN: 20
Calcium: 9.4
Glucose, Bld: 115 — ABNORMAL HIGH
Total Protein: 6.9

## 2011-01-02 LAB — BLOOD GAS, ARTERIAL
Acid-base deficit: 1.7
Bicarbonate: 22.2
O2 Saturation: 98.4
pO2, Arterial: 103 — ABNORMAL HIGH

## 2011-01-02 LAB — POCT I-STAT GLUCOSE
Glucose, Bld: 115 — ABNORMAL HIGH
Operator id: 3406

## 2011-01-02 LAB — CREATININE, SERUM
Creatinine, Ser: 1.25 — ABNORMAL HIGH
GFR calc Af Amer: >60
GFR calc non Af Amer: 41 — ABNORMAL LOW

## 2011-01-02 LAB — APTT
aPTT: 35
aPTT: >200

## 2011-01-02 LAB — PREPARE PLATELET PHERESIS

## 2011-01-02 LAB — PROTIME-INR
INR: 1.4
Prothrombin Time: 16.3 — ABNORMAL HIGH

## 2011-01-02 LAB — HEMOGLOBIN A1C: Hgb A1c MFr Bld: 5.7

## 2011-02-01 ENCOUNTER — Other Ambulatory Visit: Payer: Self-pay | Admitting: *Deleted

## 2011-02-01 MED ORDER — CLOPIDOGREL BISULFATE 75 MG PO TABS: 75.0000 mg | ORAL_TABLET | Freq: Every day | ORAL | Status: DC

## 2011-02-08 ENCOUNTER — Other Ambulatory Visit: Payer: Self-pay

## 2011-02-08 MED ORDER — ROSUVASTATIN CALCIUM 10 MG PO TABS: 10.0000 mg | ORAL_TABLET | Freq: Every day | ORAL | Status: DC

## 2011-02-13 ENCOUNTER — Ambulatory Visit (INDEPENDENT_AMBULATORY_CARE_PROVIDER_SITE_OTHER): Payer: Medicare Other | Admitting: Cardiology

## 2011-02-13 ENCOUNTER — Encounter: Payer: Self-pay | Admitting: Cardiology

## 2011-02-13 VITALS — BP 150/74 | HR 58 | Ht 65.0 in | Wt 126.8 lb

## 2011-02-13 DIAGNOSIS — E785 Hyperlipidemia, unspecified: Secondary | ICD-10-CM

## 2011-02-13 DIAGNOSIS — I2510 Atherosclerotic heart disease of native coronary artery without angina pectoris: Secondary | ICD-10-CM

## 2011-02-13 DIAGNOSIS — I10 Essential (primary) hypertension: Secondary | ICD-10-CM

## 2011-02-13 DIAGNOSIS — E7800 Pure hypercholesterolemia, unspecified: Secondary | ICD-10-CM

## 2011-02-13 DIAGNOSIS — Z951 Presence of aortocoronary bypass graft: Secondary | ICD-10-CM

## 2011-02-13 NOTE — Assessment & Plan Note (Signed)
We will request copies of her most recent lab work from her primary care physician. She will continue on her current therapy.

## 2011-02-13 NOTE — Assessment & Plan Note (Signed)
Blood pressure is mildly elevated today but her readings at home have been quite satisfactory.

## 2011-02-13 NOTE — Progress Notes (Signed)
Anne Hanna Date of Birth: 12-17-1922   History of Present Illness: Anne Hanna is seen today for followup of her coronary disease. She is feeling very well. She continues to walk 25-30 minutes a day with her dog. She denies any chest pain or shortness of breath. She denies any palpitations or dizziness.Blood pressure at home is usually between 125-130 systolic and 65-75 diastolic. She reports lab work with Anne Hanna showed a high potassium.   Current Outpatient Prescriptions on File Prior to Visit  Medication Sig Dispense Refill  . clopidogrel (PLAVIX) 75 MG tablet Take 1 tablet (75 mg total) by mouth daily.  30 tablet  5  . hydrochlorothiazide 25 MG tablet Take 25 mg by mouth daily.        . Nebivolol HCl (BYSTOLIC PO) Take 10 mg by mouth daily.       . nitroGLYCERIN (NITROSTAT) 0.4 MG SL tablet Place 0.4 mg under the tongue every 5 (five) minutes as needed.        . rosuvastatin (CRESTOR) 10 MG tablet Take 1 tablet (10 mg total) by mouth at bedtime.  30 tablet  1  . Timolol Maleate (ISTALOL OP) Apply to eye daily.          Allergies  Allergen Reactions  . Lipitor (Atorvastatin Calcium)     HEADACHES    Past Medical History  Diagnosis Date  . Coronary artery disease     STATUS POST CABG  . Hypertension   . Hypercholesterolemia   . CVA (cerebral vascular accident)     OCULAR CVA  . Hx: UTI (urinary tract infection)   . Glaucoma     History of glaucoma  . Bronchopneumonia     Right lung  . Urinary tract infection     Escherichia coli urinary tract infection  . Acute sinusitis   . Thrombocytopenia 2010     platelet count of 96 at discharge (95 on admission)  . Hyperlipidemia   . History of transient ischemic attack   . Hyponatremia     volume depletion - resolved  . Volume overload     History of postoperative volume overload  . Delirium     Postoperative delirium  . Cataract     Past Surgical History  Procedure Date  . Cardiac catheterization 10/05/2006   NORMAL LEFT VENTRICULAR SIZE. THERE IS MODERATE GLOBAL HYPOKINESIA WITH OVERALL EF 40%  . Coronary artery bypass graft 09/2006    LIMA GRAFT TO THE LAD, SAPHENOUS VEIN GRAFT TO THE FIRST OBTUSE MARGINAL VESSEL WITH SEQUENTIAL GRAFT TO THE SECOND MARGINAL VESSEL , AND SAPHENOUS VEIN GRAFT TO THE PDA  . Cataract surgery     Bilateral cataract extraction-- x2  . Tendon repair     Status post repair of tendon of the left index finger    History  Smoking status  . Never Smoker   Smokeless tobacco  . Not on file    History  Alcohol Use  . Yes    Family History  Problem Relation Age of Onset  . Heart failure Mother   . Heart failure Father   . Heart disease Brother     Review of Systems:  As noted in HPI. All other systems were reviewed and are negative.  Physical Exam: BP 150/74  Pulse 58  Ht 5\' 5"  (1.651 m)  Wt 126 lb 12.8 oz (57.516 kg)  BMI 21.10 kg/m2 She is a pleasant white female in no acute distress. HEENT exam is unremarkable.  She has no JVD or bruits. Lungs are clear. Cardiac exam reveals a regular rate and rhythm without gallop, murmur, or click. She has no edema. Pedal pulses are good. Blood pressure was repeated and is unchanged. LABORATORY DATA: N/A   Assessment / Plan:

## 2011-02-13 NOTE — Patient Instructions (Signed)
Continue your current medications. Avoid potassium in your diet.  We will get a copy of your lab work from Dr. Clelia Croft.

## 2011-02-13 NOTE — Assessment & Plan Note (Signed)
She remains asymptomatic. We will continue on her medical therapy. I would not anticipate doing a followup stress test unless she has symptoms given her advanced age.

## 2011-03-27 ENCOUNTER — Other Ambulatory Visit: Payer: Self-pay | Admitting: Cardiology

## 2011-04-25 ENCOUNTER — Other Ambulatory Visit: Payer: Self-pay | Admitting: Cardiology

## 2011-07-25 ENCOUNTER — Other Ambulatory Visit: Payer: Self-pay | Admitting: Cardiology

## 2011-08-31 ENCOUNTER — Encounter: Payer: Self-pay | Admitting: Cardiology

## 2011-08-31 ENCOUNTER — Ambulatory Visit (INDEPENDENT_AMBULATORY_CARE_PROVIDER_SITE_OTHER): Payer: Medicare Other | Admitting: Cardiology

## 2011-08-31 VITALS — BP 102/54 | HR 58 | Ht 65.0 in | Wt 124.0 lb

## 2011-08-31 DIAGNOSIS — E785 Hyperlipidemia, unspecified: Secondary | ICD-10-CM

## 2011-08-31 DIAGNOSIS — I10 Essential (primary) hypertension: Secondary | ICD-10-CM

## 2011-08-31 DIAGNOSIS — I2510 Atherosclerotic heart disease of native coronary artery without angina pectoris: Secondary | ICD-10-CM

## 2011-08-31 DIAGNOSIS — E7800 Pure hypercholesterolemia, unspecified: Secondary | ICD-10-CM

## 2011-08-31 NOTE — Patient Instructions (Addendum)
Continue your current therapy.  I will see you again in 6 months.  Your physician wants you to follow-up in: 6 mopnths You will receive a reminder letter in the mail two months in advance. If you don't receive a letter, please call our office to schedule the follow-up appointment.

## 2011-09-01 NOTE — Assessment & Plan Note (Signed)
Blood pressure control is excellent by my exam today. Continue her current medication.

## 2011-09-01 NOTE — Assessment & Plan Note (Signed)
I reviewed the results of her last lipid panel from primary care. This was acceptable. We'll continue on Crestor.

## 2011-09-01 NOTE — Progress Notes (Signed)
Anne Hanna Date of Birth: 12/17/1922   History of Present Illness: Anne Hanna is seen today for followup of her coronary disease. She reports that she is moving around okay. She's been under a lot of stress recently taking care of her older sister who fell and had a bone fracture. As noted on her last lab work her potassium was elevated at 6.4 but when repeated 2 days later was 4.4. She does try to watch how much high potassium food she eats. She denies any significant chest pain or shortness of breath.   Current Outpatient Prescriptions on File Prior to Visit  Medication Sig Dispense Refill  . clopidogrel (PLAVIX) 75 MG tablet TAKE ONE TABLET BY MOUTH ONE TIME DAILY  30 tablet  4  . CRESTOR 10 MG tablet TAKE  ONE TABLET BY MOUTH NIGHTLY AT BEDTIME  30 each  5  . hydrochlorothiazide 25 MG tablet Take 25 mg by mouth daily.        . Nebivolol HCl (BYSTOLIC PO) Take 10 mg by mouth daily.       . nitroGLYCERIN (NITROSTAT) 0.4 MG SL tablet Place 0.4 mg under the tongue every 5 (five) minutes as needed.        . Timolol Maleate (ISTALOL OP) Apply to eye daily.          Allergies  Allergen Reactions  . Lipitor (Atorvastatin Calcium)     HEADACHES    Past Medical History  Diagnosis Date  . Coronary artery disease     STATUS POST CABG  . Hypertension   . Hypercholesterolemia   . CVA (cerebral vascular accident)     OCULAR CVA  . Hx: UTI (urinary tract infection)   . Glaucoma     History of glaucoma  . Bronchopneumonia     Right lung  . Urinary tract infection     Escherichia coli urinary tract infection  . Acute sinusitis   . Thrombocytopenia 2010     platelet count of 96 at discharge (95 on admission)  . Hyperlipidemia   . History of transient ischemic attack   . Hyponatremia     volume depletion - resolved  . Volume overload     History of postoperative volume overload  . Delirium     Postoperative delirium  . Cataract     Past Surgical History  Procedure Date    . Cardiac catheterization 10/05/2006    NORMAL LEFT VENTRICULAR SIZE. THERE IS MODERATE GLOBAL HYPOKINESIA WITH OVERALL EF 40%  . Coronary artery bypass graft 09/2006    LIMA GRAFT TO THE LAD, SAPHENOUS VEIN GRAFT TO THE FIRST OBTUSE MARGINAL VESSEL WITH SEQUENTIAL GRAFT TO THE SECOND MARGINAL VESSEL , AND SAPHENOUS VEIN GRAFT TO THE PDA  . Cataract surgery     Bilateral cataract extraction-- x2  . Tendon repair     Status post repair of tendon of the left index finger    History  Smoking status  . Never Smoker   Smokeless tobacco  . Not on file    History  Alcohol Use  . Yes    Family History  Problem Relation Age of Onset  . Heart failure Mother   . Heart failure Father   . Heart disease Brother     Review of Systems:  As noted in HPI. All other systems were reviewed and are negative.  Physical Exam: BP 102/54  Pulse 58  Ht 5\' 5"  (1.651 m)  Wt 56.246 kg (124 lb)  BMI 20.63 kg/m2 She is a pleasant white female in no acute distress. HEENT exam is unremarkable. She has no JVD or bruits. Lungs are clear. Cardiac exam reveals a regular rate and rhythm without gallop, murmur, or click. She has no edema. Pedal pulses are good.  LABORATORY DATA:   ECG demonstrates normal sinus rhythm with a rate of 58 beats per minute. It is otherwise normal. Assessment / Plan:

## 2011-09-01 NOTE — Assessment & Plan Note (Signed)
She remains asymptomatic and is now 5 years out from her bypass surgery. We will continue her risk factor modification and medical treatment.

## 2011-11-02 ENCOUNTER — Other Ambulatory Visit: Payer: Self-pay | Admitting: Cardiology

## 2011-12-22 ENCOUNTER — Other Ambulatory Visit: Payer: Self-pay | Admitting: Cardiology

## 2011-12-27 ENCOUNTER — Encounter: Payer: Self-pay | Admitting: Cardiology

## 2012-03-04 ENCOUNTER — Inpatient Hospital Stay (HOSPITAL_COMMUNITY)
Admission: EM | Admit: 2012-03-04 | Discharge: 2012-03-11 | DRG: 064 | Disposition: A | Discharge status: Home Health | Payer: Medicare Other | Attending: Family Medicine | Admitting: Family Medicine

## 2012-03-04 ENCOUNTER — Emergency Department (HOSPITAL_COMMUNITY): Payer: Medicare Other

## 2012-03-04 ENCOUNTER — Encounter (HOSPITAL_COMMUNITY): Payer: Self-pay | Admitting: *Deleted

## 2012-03-04 DIAGNOSIS — E785 Hyperlipidemia, unspecified: Secondary | ICD-10-CM | POA: Diagnosis present

## 2012-03-04 DIAGNOSIS — R1310 Dysphagia, unspecified: Secondary | ICD-10-CM | POA: Diagnosis present

## 2012-03-04 DIAGNOSIS — Z951 Presence of aortocoronary bypass graft: Secondary | ICD-10-CM

## 2012-03-04 DIAGNOSIS — I10 Essential (primary) hypertension: Secondary | ICD-10-CM

## 2012-03-04 DIAGNOSIS — J9690 Respiratory failure, unspecified, unspecified whether with hypoxia or hypercapnia: Secondary | ICD-10-CM

## 2012-03-04 DIAGNOSIS — E876 Hypokalemia: Secondary | ICD-10-CM | POA: Diagnosis present

## 2012-03-04 DIAGNOSIS — I629 Nontraumatic intracranial hemorrhage, unspecified: Secondary | ICD-10-CM

## 2012-03-04 DIAGNOSIS — N390 Urinary tract infection, site not specified: Secondary | ICD-10-CM

## 2012-03-04 DIAGNOSIS — D649 Anemia, unspecified: Secondary | ICD-10-CM

## 2012-03-04 DIAGNOSIS — D696 Thrombocytopenia, unspecified: Secondary | ICD-10-CM | POA: Diagnosis present

## 2012-03-04 DIAGNOSIS — N179 Acute kidney failure, unspecified: Secondary | ICD-10-CM | POA: Diagnosis present

## 2012-03-04 DIAGNOSIS — I639 Cerebral infarction, unspecified: Secondary | ICD-10-CM

## 2012-03-04 DIAGNOSIS — G9340 Encephalopathy, unspecified: Secondary | ICD-10-CM

## 2012-03-04 DIAGNOSIS — E7800 Pure hypercholesterolemia, unspecified: Secondary | ICD-10-CM

## 2012-03-04 DIAGNOSIS — Z87440 Personal history of urinary (tract) infections: Secondary | ICD-10-CM

## 2012-03-04 DIAGNOSIS — Z79899 Other long term (current) drug therapy: Secondary | ICD-10-CM

## 2012-03-04 DIAGNOSIS — I2510 Atherosclerotic heart disease of native coronary artery without angina pectoris: Secondary | ICD-10-CM

## 2012-03-04 DIAGNOSIS — Z8249 Family history of ischemic heart disease and other diseases of the circulatory system: Secondary | ICD-10-CM

## 2012-03-04 DIAGNOSIS — G9349 Other encephalopathy: Secondary | ICD-10-CM | POA: Diagnosis present

## 2012-03-04 DIAGNOSIS — B954 Other streptococcus as the cause of diseases classified elsewhere: Secondary | ICD-10-CM | POA: Diagnosis present

## 2012-03-04 DIAGNOSIS — I619 Nontraumatic intracerebral hemorrhage, unspecified: Principal | ICD-10-CM

## 2012-03-04 DIAGNOSIS — R4701 Aphasia: Secondary | ICD-10-CM | POA: Diagnosis present

## 2012-03-04 DIAGNOSIS — J9600 Acute respiratory failure, unspecified whether with hypoxia or hypercapnia: Secondary | ICD-10-CM

## 2012-03-04 DIAGNOSIS — R569 Unspecified convulsions: Secondary | ICD-10-CM

## 2012-03-04 LAB — TROPONIN I: Troponin I: <0.3 ng/mL (ref <0.30)

## 2012-03-04 LAB — URINALYSIS, ROUTINE W REFLEX MICROSCOPIC
Bilirubin Urine: NEGATIVE
Nitrite: NEGATIVE
Specific Gravity, Urine: 1.013 (ref 1.005–1.030)
Urobilinogen, UA: 1 mg/dL (ref 0.0–1.0)
pH: 5.5 (ref 5.0–8.0)

## 2012-03-04 LAB — COMPREHENSIVE METABOLIC PANEL
ALT: 16 U/L (ref 0–35)
AST: 36 U/L (ref 0–37)
Albumin: 3.6 g/dL (ref 3.5–5.2)
Alkaline Phosphatase: 64 U/L (ref 39–117)
CO2: 16 mEq/L — ABNORMAL LOW (ref 19–32)
Chloride: 91 mEq/L — ABNORMAL LOW (ref 96–112)
Creatinine, Ser: 1.29 mg/dL — ABNORMAL HIGH (ref 0.50–1.10)
GFR calc non Af Amer: 36 mL/min — ABNORMAL LOW (ref >90)
Potassium: 3.8 mEq/L (ref 3.5–5.1)
Total Bilirubin: 0.3 mg/dL (ref 0.3–1.2)

## 2012-03-04 LAB — DIFFERENTIAL
Lymphs Abs: 1 10*3/uL (ref 0.7–4.0)
Monocytes Relative: 12 % (ref 3–12)
Neutro Abs: 4.6 10*3/uL (ref 1.7–7.7)
Neutrophils Relative %: 68 % (ref 43–77)

## 2012-03-04 LAB — URINE MICROSCOPIC-ADD ON

## 2012-03-04 LAB — POCT I-STAT 3, ART BLOOD GAS (G3+)
TCO2: 22 mmol/L (ref 0–100)
pCO2 arterial: 39.5 mmHg (ref 35.0–45.0)
pH, Arterial: 7.325 — ABNORMAL LOW (ref 7.350–7.450)
pO2, Arterial: 466 mmHg — ABNORMAL HIGH (ref 80.0–100.0)

## 2012-03-04 LAB — RAPID URINE DRUG SCREEN, HOSP PERFORMED
Barbiturates: NOT DETECTED
Tetrahydrocannabinol: NOT DETECTED

## 2012-03-04 LAB — CBC
Hemoglobin: 10.7 g/dL — ABNORMAL LOW (ref 12.0–15.0)
MCH: 29.9 pg (ref 26.0–34.0)
RBC: 3.58 MIL/uL — ABNORMAL LOW (ref 3.87–5.11)

## 2012-03-04 LAB — PROTIME-INR: INR: 1.23 (ref 0.00–1.49)

## 2012-03-04 LAB — APTT: aPTT: 30 seconds (ref 24–37)

## 2012-03-04 MED ORDER — PHENYTOIN SODIUM 50 MG/ML IJ SOLN
100.0000 mg | Freq: Three times a day (TID) | INTRAMUSCULAR | Status: DC
  Filled 2012-03-04 (×5): qty 2

## 2012-03-04 MED ORDER — SODIUM CHLORIDE 0.9 % IV BOLUS (SEPSIS): 500.0000 mL | Freq: Once | INTRAVENOUS | Status: DC

## 2012-03-04 MED ORDER — SUCCINYLCHOLINE CHLORIDE 20 MG/ML IJ SOLN
INTRAMUSCULAR | Status: AC
  Administered 2012-03-04: 100 mg
  Filled 2012-03-04: qty 1

## 2012-03-04 MED ORDER — CHLORHEXIDINE GLUCONATE 0.12 % MT SOLN: 15.0000 mL | Freq: Two times a day (BID) | OROMUCOSAL | Status: DC

## 2012-03-04 MED ORDER — BIOTENE DRY MOUTH MT LIQD
1.0000 "application " | Freq: Four times a day (QID) | OROMUCOSAL | Status: DC
  Administered 2012-03-05 – 2012-03-11 (×27): 15 mL via OROMUCOSAL

## 2012-03-04 MED ORDER — ROCURONIUM BROMIDE 50 MG/5ML IV SOLN
INTRAVENOUS | Status: AC
  Filled 2012-03-04: qty 2

## 2012-03-04 MED ORDER — SODIUM CHLORIDE 0.9 % IV SOLN
INTRAVENOUS | Status: DC
  Administered 2012-03-04: 1000 mL via INTRAVENOUS
  Administered 2012-03-05 – 2012-03-06 (×2): 100 mL/h via INTRAVENOUS

## 2012-03-04 MED ORDER — SODIUM CHLORIDE 0.9 % IV SOLN
1000.0000 mg | Freq: Once | INTRAVENOUS | Status: AC
  Administered 2012-03-04: 1000 mg via INTRAVENOUS
  Filled 2012-03-04: qty 20

## 2012-03-04 MED ORDER — ETOMIDATE 2 MG/ML IV SOLN
INTRAVENOUS | Status: AC
  Administered 2012-03-04: 25 mg
  Filled 2012-03-04: qty 20

## 2012-03-04 MED ORDER — PANTOPRAZOLE SODIUM 40 MG IV SOLR
40.0000 mg | Freq: Every day | INTRAVENOUS | Status: DC
  Administered 2012-03-05 – 2012-03-10 (×7): 40 mg via INTRAVENOUS
  Filled 2012-03-04 (×10): qty 40

## 2012-03-04 MED ORDER — LIDOCAINE HCL (CARDIAC) 20 MG/ML IV SOLN
INTRAVENOUS | Status: AC
  Filled 2012-03-04: qty 5

## 2012-03-04 MED ORDER — LABETALOL HCL 5 MG/ML IV SOLN: 10.0000 mg | INTRAVENOUS | Status: DC | PRN

## 2012-03-04 MED ORDER — PROPOFOL 10 MG/ML IV EMUL
5.0000 ug/kg/min | INTRAVENOUS | Status: DC
  Administered 2012-03-04: 50 ug/kg/min via INTRAVENOUS
  Administered 2012-03-04: 40 ug/kg/min via INTRAVENOUS
  Administered 2012-03-05 (×2): 30 ug/kg/min via INTRAVENOUS
  Filled 2012-03-04 (×6): qty 100

## 2012-03-04 NOTE — ED Notes (Signed)
Dr. Kirkpatrick at bedside 

## 2012-03-04 NOTE — ED Notes (Signed)
Moves spontaneously with restrainted BUE.  Does not Follow commands

## 2012-03-04 NOTE — ED Provider Notes (Signed)
History     CSN: [id number]  Arrival date & time 03/04/12  7829   First MD Initiated Contact with Patient 03/04/12 1843     Chief Complaint  Patient presents with  . unresponsive    HPI: Ms. Enneking is an 78 yo CF with history of CAD s/p CABG, HTN, HLD and CVA who presents with altered mental status. She was in her normal state of health talking to a family member on the phone when she cried out in pain. Her daughter was alerted who rushed to her home her mother was speaking incoherently. She called EMS, on their arrival she had a generalized tonic-clonic seizure lasting for 2 minutes which resolved spontaneously. She vomited once enroute. On arrival she is altered, no following commands and vomited one additional time.   Past Medical History  Diagnosis Date  . Coronary artery disease     STATUS POST CABG  . Hypertension   . Hypercholesterolemia   . CVA (cerebral vascular accident)     OCULAR CVA  . Hx: UTI (urinary tract infection)   . Glaucoma(365)     History of glaucoma  . Bronchopneumonia     Right lung  . Urinary tract infection     Escherichia coli urinary tract infection  . Acute sinusitis   . Thrombocytopenia 2010     platelet count of 96 at discharge (95 on admission)  . Hyperlipidemia   . History of transient ischemic attack   . Hyponatremia     volume depletion - resolved  . Volume overload     History of postoperative volume overload  . Delirium     Postoperative delirium  . Cataract     Past Surgical History  Procedure Date  . Cardiac catheterization 10/05/2006    NORMAL LEFT VENTRICULAR SIZE. THERE IS MODERATE GLOBAL HYPOKINESIA WITH OVERALL EF 40%  . Coronary artery bypass graft 09/2006    LIMA GRAFT TO THE LAD, SAPHENOUS VEIN GRAFT TO THE FIRST OBTUSE MARGINAL VESSEL WITH SEQUENTIAL GRAFT TO THE SECOND MARGINAL VESSEL , AND SAPHENOUS VEIN GRAFT TO THE PDA  . Cataract surgery     Bilateral cataract extraction-- x2  . Tendon repair     Status post  repair of tendon of the left index finger    Family History  Problem Relation Age of Onset  . Heart failure Mother   . Heart failure Father   . Heart disease Brother     History  Substance Use Topics  . Smoking status: Never Smoker   . Smokeless tobacco: Not on file  . Alcohol Use: Yes    OB History    Grav Para Term Preterm Abortions TAB SAB Ect Mult Living                  Review of Systems  Unable to perform ROS: Mental status change    Allergies  Lipitor  Home Medications   Current Outpatient Rx  Name  Route  Sig  Dispense  Refill  . CLOPIDOGREL BISULFATE 75 MG PO TABS   Oral   Take 75 mg by mouth daily.         Marland Kitchen HYDROCHLOROTHIAZIDE 25 MG PO TABS   Oral   Take 25 mg by mouth daily.           . NEBIVOLOL HCL 10 MG PO TABS   Oral   Take 10 mg by mouth daily.         Marland Kitchen NITROGLYCERIN 0.4 MG  SL SUBL   Sublingual   Place 0.4 mg under the tongue every 5 (five) minutes as needed.           Marland Kitchen ROSUVASTATIN CALCIUM 10 MG PO TABS   Oral   Take 10 mg by mouth at bedtime.           BP 126/56  Pulse 77  Resp 24  Wt 123 lb 14.4 oz (56.2 kg)  SpO2 100%  Physical Exam  Nursing note and vitals reviewed. Constitutional: She is uncooperative. She has a sickly appearance.       Thin appearing elderly female.   HENT:  Head: Normocephalic. Head is with laceration (tongue).       Tongue swelling with blood in mouth   Eyes:       Pupils 2 mm, minimally reactive   Neck: Neck supple. No JVD present.  Cardiovascular: Regular rhythm, S1 normal, S2 normal and normal heart sounds.  Tachycardia present.  Exam reveals no decreased pulses.   Pulmonary/Chest: Bradypnea noted. She has rhonchi (diffuse).  Abdominal: Normal appearance.  Musculoskeletal: She exhibits no edema.  Neurological: She is unresponsive. GCS eye subscore is 1. GCS verbal subscore is 3. GCS motor subscore is 3.       Neuro exam limited by mental status, moves all extremities.   Skin: Skin  is warm and dry.    ED Course  INTUBATION Date/Time: 03/04/2012 6:45 PM Performed by: Margie Billet Authorized by: Joya Gaskins Consent: The procedure was performed in an emergent situation. Patient identity confirmed: arm band Indications: respiratory failure and airway protection Intubation method: video-assisted Patient status: paralyzed (RSI) Sedatives: etomidate Paralytic: succinylcholine Laryngoscope size: Mac 4 Tube size: 7.0 mm Tube type: cuffed Number of attempts: 1 Cricoid pressure: no Cords visualized: yes Post-procedure assessment: chest rise and CO2 detector Breath sounds: equal and absent over the epigastrium ETT to lip: 23 cm ETT to teeth: 22 cm Tube secured with: ETT holder and adhesive tape Chest x-ray interpreted by me and radiologist. Chest x-ray findings: endotracheal tube in appropriate position Patient tolerance: Patient tolerated the procedure well with no immediate complications.    Labs Reviewed  GLUCOSE, CAPILLARY - Abnormal; Notable for the following:    Glucose-Capillary 119 (*)     All other components within normal limits  PROTIME-INR - Abnormal; Notable for the following:    Prothrombin Time 15.3 (*)     All other components within normal limits  CBC - Abnormal; Notable for the following:    RBC 3.58 (*)     Hemoglobin 10.7 (*)     HCT 31.0 (*)     Platelets 137 (*)     All other components within normal limits  COMPREHENSIVE METABOLIC PANEL - Abnormal; Notable for the following:    Sodium 131 (*)     Chloride 91 (*)     CO2 16 (*)     Glucose, Bld 122 (*)     BUN 29 (*)     Creatinine, Ser 1.29 (*)     GFR calc non Af Amer 36 (*)     GFR calc Af Amer 42 (*)     All other components within normal limits  URINALYSIS, ROUTINE W REFLEX MICROSCOPIC - Abnormal; Notable for the following:    APPearance CLOUDY (*)     Hgb urine dipstick MODERATE (*)     Protein, ur 100 (*)     Leukocytes, UA TRACE (*)     All other components  within normal limits  POCT I-STAT 3, BLOOD GAS (G3+) - Abnormal; Notable for the following:    pH, Arterial 7.325 (*)     pO2, Arterial 466.0 (*)     Acid-base deficit 5.0 (*)     All other components within normal limits  URINE MICROSCOPIC-ADD ON - Abnormal; Notable for the following:    Squamous Epithelial / LPF FEW (*)     Bacteria, UA MANY (*)     All other components within normal limits  APTT  DIFFERENTIAL  URINE RAPID DRUG SCREEN (HOSP PERFORMED)  TROPONIN I  MRSA PCR SCREENING  URINE CULTURE  CBC  BASIC METABOLIC PANEL  PHOSPHORUS  MAGNESIUM   Ct Head Wo Contrast  03/04/2012  *RADIOLOGY REPORT*  Clinical Data: Seizure, unresponsive  CT HEAD WITHOUT CONTRAST  Technique:  Contiguous axial images were obtained from the base of the skull through the vertex without contrast.  Comparison: MRI brain dated 04/16/2008  Findings: 11 x 8 mm hyperdense lesion/hemorrhage in the medial left temporal lobe/hippocampus.  No evidence of extra-axial fluid collection.  No mass lesion, mass effect, or midline shift.  No CT evidence of acute infarction.  Extensive subcortical white matter and periventricular small vessel ischemic changes.  Intracranial atherosclerosis.  Global cortical and central atrophy.  Very mild progression of ventriculomegaly since 2010.  The visualized paranasal sinuses are essentially clear. The mastoid air cells are unopacified.  No evidence of calvarial fracture.  IMPRESSION: 11 x 8 mm focus of hemorrhage in the left hippocampus.  Atrophy with extensive small vessel ischemic changes and intracranial atherosclerosis.  Very mild progression of ventriculomegaly since 2010.  These results were called by telephone on 03/04/2012 at 2010 hours to Dr. Zadie Rhine, who verbally acknowledged these results.   Original Report Authenticated By: Charline Bills, M.D.    Dg Chest Port 1 View  03/04/2012  *RADIOLOGY REPORT*  Clinical Data: 78 year old female shortness of breath, and  conscious.  PORTABLE CHEST - 1 VIEW  Comparison: 06/21/2008 and earlier.  Findings: Portable supine view 2007 hours.  Endotracheal tube tip just below the level of clavicles.  Lower lung volumes.  Stable cardiac size and mediastinal contours.  Sequelae of CABG.  Apical scarring is stable.  No pneumothorax, effusion, or consolidation. No pulmonary edema.  IMPRESSION: 1.  Endotracheal tube tip just below the level of clavicles. 2.  Lower lung volumes, otherwise no acute cardiopulmonary abnormality identified.   Original Report Authenticated By: Erskine Speed, M.D.    1. CVA (cerebral infarction)   2. Respiratory failure   3. ICH (intracerebral hemorrhage)   4. Acute encephalopathy   5. Acute respiratory failure   6. Anemia   7. Coronary artery disease   8. Hypercholesterolemia   9. Hypertension   10. Seizure   11. Unspecified intracranial hemorrhage    MDM  78 yo CF with history of CAD s/p CABG, HTN, HLD and CVA who presents with altered mental status. Afebrile, tachycardic and hypertensive. GCS 7, tongue swollen, and vomited X2 so intubated for airway protection. CXR shows adequate position of ET tube. Sedated with propofol. Due to acute nature of AMS concern for intracranial abnormality. Although sepsis or metabolic derangements can present similarly. Taken urgently to head CT following intubation which revealed small hippocampus hemorrhage. This is likely cause of patient's symptoms and seizure. Upon return from CT scanner, patient's blood pressure normalized. Neurology consulted and evaluated patient. They recommend dilantin load and admission to ICU. Labs: Hgb 10.7, WBC 6.7, Na 131,  Cl 91, CO2 91, Cr 1.3. Normal UDS. CCM was consulted for admission. Tranported to ICU in stable condition. Discussed w/u with patient's daughter. Prior to transport upstairs she was moving all extremities but did not follow commands.   ECG: SR, rate 88, normal axis, no significant STE or depression. No change from  previous.   Reviewed imaging, labs and previous medical records, utilized in MDM  Discussed case with Dr. Bebe Shaggy  Clinical Impression 1. Intracranial hemorrhage 2. Altered mental status 3. Generalized seizure 4. Acute respiratory failure 5. Chronic kidney disease 6. Chronic anemia, unknown etiology        Margie Billet, MD 03/05/12 [phone]

## 2012-03-04 NOTE — ED Notes (Signed)
Pt'ss silvler watch given to oldest daughter Bonita Quin

## 2012-03-04 NOTE — ED Notes (Signed)
Daughter speaking with Dr Amada Jupiter.  Pt moving spontaneously all extremeties.  Perl 2mm.  Remains on Propofol, increased to 5o mcg.

## 2012-03-04 NOTE — ED Notes (Signed)
Patient on phone with family members and per family they heard patient cry out in pain and then speaking jibberish, patient unresponsive with EMS, patient with opa for and bvm for oxygenation enroute to ED, patient experiencing seizure enroute to ED with ems, patient unresponsive upon arrival

## 2012-03-04 NOTE — H&P (Signed)
PULMONARY  / CRITICAL CARE MEDICINE  Name: Anne Hanna MRN: [id number] DOB: 12/27/1922    LOS: 0  REFERRING MD:  EDP  CHIEF COMPLAINT:  Acute encephalopathy / seizure  BRIEF PATIENT DESCRIPTION: 78 yo on Plavix seen in [facility] ED on 12/16 with aphasia and seizure.  Intubated for airway protection.  Head CT revealed small mesial temporal hemorrhage.  LINES / TUBES: 12/16  OETT 12/16  Foley  CULTURES:   ANTIBIOTICS:   SIGNIFICANT EVENTS:  12/16   Seen in [facility] ED on 12/16 with aphasia and seizure.  Intubated for airway protection.  Head CT revealed small mesial temporal hemorrhage.  LEVEL OF CARE:  ICU  PRIMARY SERVICE:  PCCM  CONSULTANTS:  Neurology  CODE STATUS:  Full  DIET:  NPO  DVT Px:  SCDs  GI Px:  Protonix  The patient is encephalopathic and unable to provide history, which was obtained for available medical records.  HISTORY OF PRESENT ILLNESS:  78 yo on Plavix seen in [name] ED on 12/16 with aphasia and seizure.  Intubated for airway protection.  Head CT revealed small mesial temporal hemorrhage.  PAST MEDICAL HISTORY :  Past Medical History  Diagnosis Date  . Coronary artery disease     STATUS POST CABG  . Hypertension   . Hypercholesterolemia   . CVA (cerebral vascular accident)     OCULAR CVA  . Hx: UTI (urinary tract infection)   . Glaucoma(365)     History of glaucoma  . Bronchopneumonia     Right lung  . Urinary tract infection     Escherichia coli urinary tract infection  . Acute sinusitis   . Thrombocytopenia 2010     platelet count of 96 at discharge (95 on admission)  . Hyperlipidemia   . History of transient ischemic attack   . Hyponatremia     volume depletion - resolved  . Volume overload     History of postoperative volume overload  . Delirium     Postoperative delirium  . Cataract    Past Surgical History  Procedure Date  . Cardiac catheterization 10/05/2006    NORMAL LEFT VENTRICULAR SIZE. THERE IS MODERATE GLOBAL HYPOKINESIA  WITH OVERALL EF 40%  . Coronary artery bypass graft 09/2006    LIMA GRAFT TO THE LAD, SAPHENOUS VEIN GRAFT TO THE FIRST OBTUSE MARGINAL VESSEL WITH SEQUENTIAL GRAFT TO THE SECOND MARGINAL VESSEL , AND SAPHENOUS VEIN GRAFT TO THE PDA  . Cataract surgery     Bilateral cataract extraction-- x2  . Tendon repair     Status post repair of tendon of the left index finger   Prior to Admission medications   Medication Sig Start Date End Date Taking? Authorizing Provider  clopidogrel (PLAVIX) 75 MG tablet Take 75 mg by mouth daily.   Yes Historical Provider, MD  hydrochlorothiazide 25 MG tablet Take 25 mg by mouth daily.     Yes Historical Provider, MD  nebivolol (BYSTOLIC) 10 MG tablet Take 10 mg by mouth daily.   Yes Historical Provider, MD  nitroGLYCERIN (NITROSTAT) 0.4 MG SL tablet Place 0.4 mg under the tongue every 5 (five) minutes as needed.     Yes Historical Provider, MD  rosuvastatin (CRESTOR) 10 MG tablet Take 10 mg by mouth at bedtime.   Yes Historical Provider, MD   Allergies  Allergen Reactions  . Lipitor (Atorvastatin Calcium)     HEADACHES   FAMILY HISTORY:  Family History  Problem Relation Age of Onset  . Heart  failure Mother   . Heart failure Father   . Heart disease Brother    SOCIAL HISTORY:  reports that she has never smoked. She does not have any smokeless tobacco history on file. She reports that she drinks alcohol. She reports that she does not use illicit drugs.  REVIEW OF SYSTEMS:  Unable to provide.  INTERVAL HISTORY:  VITAL SIGNS: Pulse Rate:  [64-113] 64  (12/16 2200) Resp:  [12-24] 13  (12/16 2200) BP: (90-216)/(35-96) 90/35 mmHg (12/16 2200) SpO2:  [99 %-100 %] 100 % (12/16 2200) FiO2 (%):  [50 %-100 %] 50 % (12/16 2017) Weight:  [56.2 kg (123 lb 14.4 oz)] 56.2 kg (123 lb 14.4 oz) (12/16 1846)  HEMODYNAMICS:   VENTILATOR SETTINGS: Vent Mode:  [-] PRVC FiO2 (%):  [50 %-100 %] 50 % Set Rate:  [16 bmp] 16 bmp Vt Set:  [460 mL] 460 mL PEEP:  [5 cmH20]  5 cmH20  INTAKE / OUTPUT: Intake/Output      12/16 0701 - 12/17 0700   Urine (mL/kg/hr) 300 (0.3)   Total Output 300   Net -300         PHYSICAL EXAMINATION: General:  Mechanically ventilated, synchronous Neuro:  Encephalopathic, nonfocal, cough / gag diminished HEENT:  PERRL, OETT  Cardiovascular:  RRR, no m/r/g Lungs:  Bilateral diminished air entry, no w/r/r Abdomen:  Soft, nontender, bowel sounds present Musculoskeletal:  Moves all extremities, no edema Skin:  Intact  LABS:  Lab 03/04/12 1922 03/04/12 1904  HGB -- 10.7*  WBC -- 6.7  PLT -- 137*  NA -- 131*  K -- 3.8  CL -- 91*  CO2 -- 16*  GLUCOSE -- 122*  BUN -- 29*  CREATININE -- 1.29*  CALCIUM -- 9.7  MG -- --  PHOS -- --  AST -- 36  ALT -- 16  ALKPHOS -- 64  BILITOT -- 0.3  PROT -- 6.8  ALBUMIN -- 3.6  APTT -- 30  INR -- 1.23  LATICACIDVEN -- --  TROPONINI -- <0.30  PROCALCITON -- --  PROBNP -- --  O2SATVEN -- --  PHART 7.325* --  PCO2ART 39.5 --  PO2ART 466.0* --    Lab 03/04/12 1844  GLUCAP 119*   IMAGING: 12/16  PCXR >>> ETT in good position, no overt airspace disease 12/16  Head CT >>> Small left hippocampal hemorrhage.  Progressive chronic ventriculometry.   ECG:  12/16 >>> sinus on monitor   DIAGNOSES: Active Problems:  Coronary artery disease  Hypertension  Hypercholesterolemia  CVA (cerebral vascular accident)  Unspecified intracranial hemorrhage  Seizure  Acute respiratory failure  Acute encephalopathy  Anemia  ASSESSMENT / PLAN:  PULMONARY  A:  Acute respiratory failure - intubated for airway protection. P:   Gaol SpO2>92, pH>7.30 Full mechanical support Daily SBT Trend ABG / CXR  CARDIOVASCULAR  A: Hemodynamically stable. CAD s/p CABG.  HTN. Dyslipidemia. P:  Goal SBP < 160 per Neurology Labetalol PRN Hold Plavix as active hemorrhage Restart Crestor / Bystolic when able to take PO  RENAL  A:  Dehydration.  Renal failure (acute vs chronic). P:    Trend BMP NS 500 x 1 NS@100  Hold HCTZ  GASTROINTESTINAL  A:  No active issues. P:   NPO as intubated TF if remains intubated > 24 hours  HEMATOLOGIC  A:  Anemia (acute vs chronic).  Thrombocytopenia, chronic. P:  Trend CBC  INFECTIOUS  A:  No evidence of active infection. P:   No interventions required  ENDOCRINE  A:  No active issues. P:   No interventions required  NEUROLOGIC  A:  Hippocampal hemorrhage.  Seizure.  History of CVA. P:   Propofol gtt for synchrony Phenytoin load and maintenance Goal RASS 0 to -1 Neuro checks MRI / EEG pending   CLINICAL SUMMARY: 78 yo on Plavix seen in [facility] ED on 12/16 with aphasia and seizure.  Intubated for airway protection.  Head CT revealed small mesial temporal hemorrhage. Loaded with Phenytoin. Plavix d/c'd.  MRI / EEG in AM.  Possible extubation in AM if no further seizure activity and appropriate mental status.  I have personally obtained a history, examined the patient, evaluated laboratory and imaging results, formulated the assessment and plan and placed orders.  CRITICAL CARE:  The patient is critically ill with multiple organ systems failure and requires high complexity decision making for assessment and support, frequent evaluation and titration of therapies, application of advanced monitoring technologies and extensive interpretation of multiple databases. Critical Care Time devoted to patient care services described in this note is 60 minutes.   Lonia Farber, MD  Pulmonary and Critical Care Medicine [facility] Pager: [phone]  03/04/2012, 10:41 PM

## 2012-03-04 NOTE — Consult Note (Signed)
Reason for Consult:Seizure Referring Physician: Bebe Shaggy, D  CC: Seizure  History is obtained from:Daughter  HPI: Anne Hanna is a 78 y.o. female who was speaking on the phone earlier and started speaking gibberish. Her daughter was called as was EMS and on arrival she was seen to have a seizure. She remained altered in the ER and was intubated, but has since had improvement. She currently is moving all 4 ext, but not following commands. A CT shows a small mesial temporal hemorrhage.   She takes ASA but no other thinners.   ROS: Unable to obtain due to intubation.   Past Medical History  Diagnosis Date  . Coronary artery disease     STATUS POST CABG  . Hypertension   . Hypercholesterolemia   . CVA (cerebral vascular accident)     OCULAR CVA  . Hx: UTI (urinary tract infection)   . Glaucoma(365)     History of glaucoma  . Bronchopneumonia     Right lung  . Urinary tract infection     Escherichia coli urinary tract infection  . Acute sinusitis   . Thrombocytopenia 2010     platelet count of 96 at discharge (95 on admission)  . Hyperlipidemia   . History of transient ischemic attack   . Hyponatremia     volume depletion - resolved  . Volume overload     History of postoperative volume overload  . Delirium     Postoperative delirium  . Cataract     Family History: Unable to obtain due to ams  Social History: Tob: never smoker  Exam: Current vital signs: BP 108/42  Pulse 73  Resp 12  Wt 56.2 kg (123 lb 14.4 oz)  SpO2 100% Vital signs in last 24 hours: Pulse Rate:  [73-113] 73  (12/16 2017) Resp:  [12-24] 12  (12/16 2017) BP: (108-216)/(42-96) 108/42 mmHg (12/16 2017) SpO2:  [100 %] 100 % (12/16 2017) FiO2 (%):  [50 %-100 %] 50 % (12/16 2017) Weight:  [56.2 kg (123 lb 14.4 oz)] 56.2 kg (123 lb 14.4 oz) (12/16 1846)  General: in bed, trying to reach her tube CV: RRR Mental Status: Patient does not open eyes to nox tim, but does briskly localize with both  arms. She does not follow commands.  Cranial Nerves: II: Does not blink to threat. Pupils are equal, round, and reactive to light.  Discs are difficult to visualize. III,IV, VI: Does not fixate or track, dolls eye intact. V: responds to nox tim on both sides VII: Facial movement is symmetric.  VIII: unable to assess due to ams.  X, XI,, XII: unable to assess due to intubation.  Motor: Tone is normal. Bulk is normal. 5/5 strength was present in all four extremities, though formal testing unable to be performed.   Sensory: Responds to nox stim x 4.  Deep Tendon Reflexes: 1+ and symmetric in biceps and patella Cerebellar: Unable to assess due to ams Gait: Unable to assess 2/2 ams.   I have reviewed labs in epic and the results pertinent to this consultation are: INR 1.2  I have reviewed the images obtained:CT head - small acute temporal hemorrhage.   Impression: 78 yo F with small intraparenchymal hemorrahge and seizures. I have requested a fosphenytoin load. Given the unusual location, I wonder about amyloid as a possible etiology, MRI may be helpful to further investigate cause.   Recommendations: 1) Fosphenytoin 20 mg/kg.  2) Dilantin 100 mg TID 3) Goal SBP < 160 4)  EEG in the AM 5) MRI brain tomorrow AM, if unable to do early, would repeat CT.   6) Hold antiplatelet medications.    Ritta Slot, MD [facility] [phone]  If 7pm- 7am, please page neurology on call at [phone].

## 2012-03-04 NOTE — ED Notes (Signed)
Daughter at bedside and updated on pt status

## 2012-03-04 NOTE — ED Notes (Signed)
Patient intubated with 7.5 cm et tube, 22 cm at the teeth, + color change,

## 2012-03-04 NOTE — ED Notes (Signed)
Returned from CT.  Bilateral hearing aids removed in CT and given to daughter [place]

## 2012-03-04 NOTE — ED Notes (Signed)
Pt placed in soft wrist restraints per MD order. Pt attempting to pull at ET tube.

## 2012-03-04 NOTE — ED Notes (Signed)
Checked patient Anne Hanna it was119 notified RN Thayer Ohm of blood sugar

## 2012-03-04 NOTE — ED Notes (Signed)
Pt opening eyes spontaneously and moving bilat RUE.  PRopofol increased to 30 mcg/kg/mon.  On vent at 50% Bilat bs clear.   Not breathiing above vent rate. Hypo bowel sounds.  Foley urine clear.  Awaiting CT.  Cardiac monitor SR 74

## 2012-03-04 NOTE — ED Notes (Signed)
Foley catheter was inserted on prior shift around 1930 hrs per report from Herbert Deaner, RN

## 2012-03-04 NOTE — ED Notes (Signed)
The pt has 2 ivs  Both inserted by gems

## 2012-03-04 NOTE — ED Provider Notes (Signed)
Pt seen on arrival with resident I was present for entire intubation I have spoken to dr Amada Jupiter with neuro, he will see patient and he asks [place] to admit to icu  Joya Gaskins, MD 03/04/12 2040

## 2012-03-04 NOTE — ED Notes (Signed)
Inserted temp foley into patient clear urine in return size 16 french

## 2012-03-05 ENCOUNTER — Ambulatory Visit: Payer: Medicare Other | Admitting: Cardiology

## 2012-03-05 ENCOUNTER — Inpatient Hospital Stay (HOSPITAL_COMMUNITY): Payer: Medicare Other

## 2012-03-05 DIAGNOSIS — I619 Nontraumatic intracerebral hemorrhage, unspecified: Principal | ICD-10-CM | POA: Diagnosis present

## 2012-03-05 DIAGNOSIS — I6350 Cerebral infarction due to unspecified occlusion or stenosis of unspecified cerebral artery: Secondary | ICD-10-CM

## 2012-03-05 LAB — PHOSPHORUS: Phosphorus: 3.2 mg/dL (ref 2.3–4.6)

## 2012-03-05 LAB — BASIC METABOLIC PANEL
BUN: 23 mg/dL (ref 6–23)
Chloride: 101 mEq/L (ref 96–112)
GFR calc Af Amer: 50 mL/min — ABNORMAL LOW (ref >90)
GFR calc non Af Amer: 43 mL/min — ABNORMAL LOW (ref >90)
Potassium: 3.5 mEq/L (ref 3.5–5.1)
Sodium: 136 mEq/L (ref 135–145)

## 2012-03-05 LAB — MRSA PCR SCREENING: MRSA by PCR: NEGATIVE

## 2012-03-05 MED ORDER — PHENYTOIN SODIUM 50 MG/ML IJ SOLN
100.0000 mg | Freq: Three times a day (TID) | INTRAMUSCULAR | Status: DC
  Administered 2012-03-05 – 2012-03-09 (×13): 100 mg via INTRAVENOUS
  Filled 2012-03-05 (×19): qty 2

## 2012-03-05 MED ORDER — MAGNESIUM SULFATE 50 % IJ SOLN
2.0000 g | Freq: Once | INTRAMUSCULAR | Status: DC
Start: 2012-03-05 — End: 2012-03-05

## 2012-03-05 MED ORDER — POTASSIUM CHLORIDE 20 MEQ/15ML (10%) PO LIQD
40.0000 meq | Freq: Once | ORAL | Status: AC
  Administered 2012-03-05: 40 meq
  Filled 2012-03-05: qty 30

## 2012-03-05 MED ORDER — MAGNESIUM SULFATE 40 MG/ML IJ SOLN
2.0000 g | Freq: Once | INTRAMUSCULAR | Status: AC
  Administered 2012-03-05: 2 g via INTRAVENOUS
  Filled 2012-03-05 (×2): qty 50

## 2012-03-05 MED ORDER — CHLORHEXIDINE GLUCONATE 0.12 % MT SOLN
15.0000 mL | Freq: Two times a day (BID) | OROMUCOSAL | Status: DC
  Administered 2012-03-05 – 2012-03-11 (×13): 15 mL via OROMUCOSAL
  Filled 2012-03-05 (×15): qty 15

## 2012-03-05 NOTE — Progress Notes (Signed)
EEG completed at bedside 

## 2012-03-05 NOTE — Progress Notes (Signed)
Stroke Team Progress Note  HISTORY HPI: Anne Hanna is a 78 y.o. female who was speaking on the phone earlier and started speaking gibberish. Her daughter was called as was EMS and on arrival she was seen to have a seizure. She remained altered in the ER and was intubated, but has since had improvement. She currently is moving all 4 ext, but not following commands. A CT shows a small mesial temporal hemorrhage.  She takes ASA but no other thinners.      SUBJECTIVE Son at bedside. Dr Pearlean Brownie spoke with him regarding the patient and all questions were answered. Pt. Still intubated and sedated. Nurse reports she follows commands when not sedated.  OBJECTIVE Most recent Vital Signs: Filed Vitals:   03/05/12 0600 03/05/12 0700 03/05/12 0800 03/05/12 0822  BP: 124/50 109/47 113/46   Pulse: 61 60 61   Temp:    98.7 F (37.1 C)  TempSrc:    Oral  Resp: 12 13 12    Height:      Weight:      SpO2:  100% 100%    CBG (last 3)   Basename 03/04/12 1844  GLUCAP 119*    IV Fluid Intake:     . sodium chloride 100 mL/hr at 03/04/12 2302  . propofol Stopped (03/05/12 0800)    MEDICATIONS    . antiseptic oral rinse  1 application Mouth Rinse QID  . chlorhexidine  15 mL Mouth/Throat BID  . magnesium sulfate 1 - 4 g bolus IVPB  2 g Intravenous Once  . pantoprazole (PROTONIX) IV  40 mg Intravenous QHS  . phenytoin (DILANTIN) IV  100 mg Intravenous Q8H  . potassium chloride  40 mEq Per Tube Once  . sodium chloride  500 mL Intravenous Once   PRN:  labetalol  Diet:  NPO  Activity:  Bedrest DVT Prophylaxis:  SCDs  CLINICALLY SIGNIFICANT STUDIES Basic Metabolic Panel:  Lab 03/05/12 1610 03/04/12 1904  NA 136 131*  K 3.5 3.8  CL 101 91*  CO2 24 16*  GLUCOSE 124* 122*  BUN 23 29*  CREATININE 1.11* 1.29*  CALCIUM 8.8 9.7  MG 1.6 --  PHOS 3.2 --   Liver Function Tests:  Lab 03/04/12 1904  AST 36  ALT 16  ALKPHOS 64  BILITOT 0.3  PROT 6.8  ALBUMIN 3.6   CBC:  Lab 03/05/12  0605 03/04/12 1904  WBC 6.2 6.7  NEUTROABS -- 4.6  HGB 9.6* 10.7*  HCT 27.6* 31.0*  MCV 87.1 86.6  PLT 104* 137*   Coagulation:  Lab 03/04/12 1904  LABPROT 15.3*  INR 1.23   Cardiac Enzymes:  Lab 03/04/12 1904  CKTOTAL --  CKMB --  CKMBINDEX --  TROPONINI <0.30   Urinalysis:  Lab 03/04/12 1909  COLORURINE YELLOW  LABSPEC 1.013  PHURINE 5.5  GLUCOSEU NEGATIVE  HGBUR MODERATE*  BILIRUBINUR NEGATIVE  KETONESUR NEGATIVE  PROTEINUR 100*  UROBILINOGEN 1.0  NITRITE NEGATIVE  LEUKOCYTESUR TRACE*   Lipid Panel No results found for this basename: chol, trig, hdl, cholhdl, vldl, ldlcalc   HgbA1C  Lab Results  Component Value Date   HGBA1C  Value: 5.7 (NOTE)   The ADA recommends the following therapeutic goals for glycemic   control related to Hgb A1C measurement:   Goal of Therapy:   < 7.0% Hgb A1C   Action Suggested:  > 8.0% Hgb A1C   Ref:  Diabetes Care, 22, Suppl. 1, 1999 10/08/2006    Urine Drug Screen:  Component Value Date/Time   LABOPIA NONE DETECTED 03/04/2012 1909   COCAINSCRNUR NONE DETECTED 03/04/2012 1909   LABBENZ NONE DETECTED 03/04/2012 1909   AMPHETMU NONE DETECTED 03/04/2012 1909   THCU NONE DETECTED 03/04/2012 1909   LABBARB NONE DETECTED 03/04/2012 1909    Alcohol Level: No results found for this basename: ETH:2 in the last 168 hours  Ct Head Wo Contrast 03/04/2012   IMPRESSION: 11 x 8 mm focus of hemorrhage in the left hippocampus.  Atrophy with extensive small vessel ischemic changes and intracranial atherosclerosis.  Very mild progression of ventriculomegaly since 2010.     Dg Chest Port 1 View 03/04/2012    IMPRESSION: 1.  Endotracheal tube tip just below the level of clavicles. 2.  Lower lung volumes, otherwise no acute cardiopulmonary abnormality identified.     MRI of the brain  - today - pending  MRA of the brain    2D Echocardiogram    Carotid Doppler    EKG  normal EKG, normal sinus rhythm, unchanged from previous tracings,  normal sinus rhythm, nonspecific ST and T waves changes.   Therapy Recommendations - pending  Physical Exam   frail elderly Caucasian lady who's intubated and sedated. . Afebrile. Head is nontraumatic. Neck is supple without bruit. . Cardiac exam no murmur or gallop. Lungs are clear to auscultation. Distal pulses are well felt.  Neurological Exam : sedated on propofol. Eyes are closed. Fundi were not visualized. Pupils are 2 mm sluggishly reactive. Doll's eye movements are preserved. There is no gaze deviation. She is want to sternal rub by moving all 4 extremities but left slightly more than right. She has purposeful movements in all 4 limbs. No focal weakness. Plantars show withdrawal response bilaterally ASSESSMENT  Anne Hanna is a 78 y.o. female presenting with gibberish speech and seizure.  Imaging confirms a small mesial temporal hemorrhage. Etiology unclear not likely to be hypertensive.  Work up underway. On clopidogrel 75 mg orally every day prior to admission. Now on no anticoagulation. Patient with resultant hemorrhagic infarct complicated by seizure activity. Intubated and sedated.   Htn history   Hyperlipidemia  Previous CVA / TIA hx   Hospital day # 1  TREATMENT/PLAN  Continue to hold Plavix.  MRI today  SCDs and GI prophylaxis  On Dilantin for seizures  Monitor Blood pressure  EEG for silent seizures  HgBA1c Discussed with patient's son  and Dr. Nadine Counts Rinehuls PA-C [facility] Pager [phone] 03/05/2012, 10:31 AM This patient is critically ill and at significant risk of neurological worsening, death and care requires constant monitoring of vital signs, hemodynamics,respiratory and cardiac monitoring,review of multiple databases, neurological assessment, discussion with family, other specialists and medical decision making of high complexity. I spent 30 minutes of neurocritical care time  in the care of  this patient.   I have  personally examined this patient, reviewed data and developed plan of care and agree with above Delia Heady, MD Medical Director [facility] Pager: [phone] 03/05/2012 6:56 PM

## 2012-03-05 NOTE — Progress Notes (Addendum)
PULMONARY  / CRITICAL CARE MEDICINE  Name: Anne Hanna MRN: [id number] DOB: [birth year]    LOS: 1  REFERRING MD:  EDP  CHIEF COMPLAINT:  Acute encephalopathy / seizure  BRIEF PATIENT DESCRIPTION: 78 yo on Plavix seen in [facility] ED on 12/16 with aphasia and seizure.  Intubated for airway protection.  Head CT revealed small mesial temporal hemorrhage.   LEVEL OF CARE:  ICU PRIMARY SERVICE:  PCCM CONSULTANTS:  Neurology CODE STATUS:  Full DIET:  NPO DVT Px:  SCDs GI Px:  Protonix   LINES / TUBES: 12/16  OETT 12/16  Foley  CULTURES: Results for orders placed during the hospital encounter of 03/04/12  MRSA PCR SCREENING     Status: Normal   Collection Time   03/04/12 11:46 PM      Component Value Range Status Comment   MRSA by PCR NEGATIVE  NEGATIVE Final      ANTIBIOTICS: Anti-infectives    None       SIGNIFICANT EVENTS:  12/16   Seen in [facility] ED on 12/16 with aphasia and seizure.  Intubated for airway protection.  Head CT revealed small mesial temporal hemorrhage.   SUBJECTIVE/OVERNIGHT/INTERVAL HX 03/05/2012: Following commands on WUA. Tongue significantly swollen and bruised post seizure. MRI due today. SBT pending   VITAL SIGNS: Temp:  [97.6 F (36.4 C)-98.7 F (37.1 C)] 98.7 F (37.1 C) (12/17 0822) Pulse Rate:  [60-113] 61  (12/17 0800) Resp:  [2-25] 12  (12/17 0800) BP: (90-216)/(35-96) 113/46 mmHg (12/17 0800) SpO2:  [99 %-100 %] 100 % (12/17 0800) FiO2 (%):  [30 %-100 %] 30 % (12/17 0822) Weight:  [56.2 kg (123 lb 14.4 oz)] 56.2 kg (123 lb 14.4 oz) (12/16 1846)  HEMODYNAMICS:   VENTILATOR SETTINGS: Vent Mode:  [-] PSV;CPAP FiO2 (%):  [30 %-100 %] 30 % Set Rate:  [16 bmp] 16 bmp Vt Set:  [460 mL] 460 mL PEEP:  [5 cmH20] 5 cmH20 Pressure Support:  [8 cmH20] 8 cmH20 Plateau Pressure:  [14 cmH20-16 cmH20] 14 cmH20  INTAKE / OUTPUT: Intake/Output      12/16 0701 - 12/17 0700 12/17 0701 - 12/18 0700   I.V. (mL/kg) 891 (15.9) 110.1 (2)   IV  Piggyback 2    Total Intake(mL/kg) 893 (15.9) 110.1 (2)   Urine (mL/kg/hr) 875 (0.6) 35   Total Output 875 35   Net +18 +75.1          PHYSICAL EXAMINATION: General:  Mechanically ventilated, synchronous Neuro:  Encephalopathic, nonfocal, cough / gag diminished HEENT:  PERRL, OETT . TONGUE SWOLLEN AND BRUISED AND PROTRUDING OUT OF MOUTH Cardiovascular:  RRR, no m/r/g Lungs:  Bilateral diminished air entry, no w/r/r Abdomen:  Soft, nontender, bowel sounds present Musculoskeletal:  Moves all extremities, no edema Skin:  Intact  LABS:  Lab 03/05/12 0605 03/04/12 1922 03/04/12 1904  HGB 9.6* -- 10.7*  WBC 6.2 -- 6.7  PLT 104* -- 137*  NA 136 -- 131*  K 3.5 -- 3.8  CL 101 -- 91*  CO2 24 -- 16*  GLUCOSE 124* -- 122*  BUN 23 -- 29*  CREATININE 1.11* -- 1.29*  CALCIUM 8.8 -- 9.7  MG 1.6 -- --  PHOS 3.2 -- --  AST -- -- 36  ALT -- -- 16  ALKPHOS -- -- 64  BILITOT -- -- 0.3  PROT -- -- 6.8  ALBUMIN -- -- 3.6  APTT -- -- 30  INR -- -- 1.23  LATICACIDVEN -- -- --  TROPONINI -- -- <0.30  PROCALCITON -- -- --  PROBNP -- -- --  O2SATVEN -- -- --  PHART -- 7.325* --  PCO2ART -- 39.5 --  PO2ART -- 466.0* --    Lab 03/04/12 1844  GLUCAP 119*   IMAGING: 12/16  PCXR >>> ETT in good position, no overt airspace disease 12/16  Head CT >>> Small left hippocampal hemorrhage.  Progressive chronic ventriculometry.  Ct Head Wo Contrast  03/04/2012  *RADIOLOGY REPORT*  Clinical Data: Seizure, unresponsive  CT HEAD WITHOUT CONTRAST  Technique:  Contiguous axial images were obtained from the base of the skull through the vertex without contrast.  Comparison: MRI brain dated 04/16/2008  Findings: 11 x 8 mm hyperdense lesion/hemorrhage in the medial left temporal lobe/hippocampus.  No evidence of extra-axial fluid collection.  No mass lesion, mass effect, or midline shift.  No CT evidence of acute infarction.  Extensive subcortical white matter and periventricular small vessel ischemic  changes.  Intracranial atherosclerosis.  Global cortical and central atrophy.  Very mild progression of ventriculomegaly since 2010.  The visualized paranasal sinuses are essentially clear. The mastoid air cells are unopacified.  No evidence of calvarial fracture.  IMPRESSION: 11 x 8 mm focus of hemorrhage in the left hippocampus.  Atrophy with extensive small vessel ischemic changes and intracranial atherosclerosis.  Very mild progression of ventriculomegaly since 2010.  These results were called by telephone on 03/04/2012 at 2010 hours to Dr. Zadie Rhine, who verbally acknowledged these results.   Original Report Authenticated By: Charline Bills, M.D.    Dg Chest Port 1 View  03/04/2012  *RADIOLOGY REPORT*  Clinical Data: 78 year old female shortness of breath, and conscious.  PORTABLE CHEST - 1 VIEW  Comparison: 06/21/2008 and earlier.  Findings: Portable supine view 2007 hours.  Endotracheal tube tip just below the level of clavicles.  Lower lung volumes.  Stable cardiac size and mediastinal contours.  Sequelae of CABG.  Apical scarring is stable.  No pneumothorax, effusion, or consolidation. No pulmonary edema.  IMPRESSION: 1.  Endotracheal tube tip just below the level of clavicles. 2.  Lower lung volumes, otherwise no acute cardiopulmonary abnormality identified.   Original Report Authenticated By: Erskine Speed, M.D.      ECG:  12/16 >>> sinus on monitor   DIAGNOSES: Active Problems:  Coronary artery disease  Hypertension  Hypercholesterolemia  CVA (cerebral vascular accident)  Unspecified intracranial hemorrhage  Seizure  Acute respiratory failure  Acute encephalopathy  Anemia  ASSESSMENT / PLAN:  PULMONARY  A:  Acute respiratory failure - intubated for airway protection ON 03/04/2012 On 03/05/12: Doing SBT but needs MRI and tongue swollen due to biting during seizure at admit, precludes extubation P:   SBT as tolerated; no extubation Full mechanical support Daily  SBT  CARDIOVASCULAR   Lab 03/04/12 1904  TROPONINI <0.30     A: Hemodynamically stable. CAD s/p CABG.  HTN. Dyslipidemia. P:  Goal SBP < 160 per Neurology Labetalol PRN Hold Plavix as active hemorrhage Restart Crestor / Bystolic when able to take PO  RENAL  Lab 03/05/12 0605 03/04/12 1904  NA 136 131*  K 3.5 3.8  CL 101 91*  CO2 24 16*  GLUCOSE 124* 122*  BUN 23 29*  CREATININE 1.11* 1.29*  CALCIUM 8.8 9.7  MG 1.6 --  PHOS 3.2 --    .  A:  Dehydration.  Renal failure (acute vs chronic). On 03/05/12: creat improving but hypomagnesemia and hypokalemia P:   kcl and mag repletion 03/05/12  Trend BMP NS@100  Hold HCTZ  GASTROINTESTINAL  A:  No active issues. P:   NPO other than meds TF if remains intubated past 03/06/12  HEMATOLOGIC  Lab 03/05/12 0605 03/04/12 1904  HGB 9.6* 10.7*  HCT 27.6* 31.0*  WBC 6.2 6.7  PLT 104* 137*    A:  Anemia (acute vs chronic).  Thrombocytopenia, chronic. P:  Trend CBC - PRBC for hgb </= 6.9gm%    - exceptions are   -  if ACS susepcted/confirmed then transfuse for hgb </= 8.0gm%,  or    -  If septic shock first 24h and scvo2 < 70% then transfuse for hgb </= 9.0gm%   - active bleeding with hemodynamic instability, then transfuse regardless of hemoglobin value   At at all times try to transfuse 1 unit prbc as possible with exception of active hemorrhage    INFECTIOUS  A:  No evidence of active infection. P:   No interventions required  ENDOCRINE   A:  No active issues. P:   No interventions required  NEUROLOGIC  A:  Hippocampal hemorrhage.  Seizure.  History of CVA. P:   Propofol gtt for synchrony Phenytoin load and maintenance Goal RASS 0 to -1 Neuro checks MRI / EEG pending as of 03/05/12    CRITICAL CARE:  The patient is critically ill with multiple organ systems failure and requires high complexity decision making for assessment and support, frequent evaluation and titration of therapies,  application of advanced monitoring technologies and extensive interpretation of multiple databases. Critical Care Time devoted to patient care services described in this note is 31 minutes.     Dr. Kalman Shan, M.D., [facility].C.P Pulmonary and Critical Care Medicine Staff Physician [facility] Pager: [phone], If no answer or between  15:00h - 7:00h: call [phone]  03/05/2012 9:21 AM

## 2012-03-05 NOTE — Progress Notes (Signed)
UR completed 

## 2012-03-05 NOTE — ED Provider Notes (Signed)
I have personally seen and examined the patient and discussed plan of care with the resident.  I was present for entire procedure.  I have reviewed the appropriate documentation on PMH/FH/Soc. History.  I have reviewed the documentation of the resident and agree.   I have reviewed and agree with the ECG interpretation(s) documented by the resident.  CRITICAL CARE Performed by: Joya Gaskins   Total critical care time: 40  Critical care time was exclusive of separately billable procedures and treating other patients.  Critical care was necessary to treat or prevent imminent or life-threatening deterioration.  Critical care was time spent personally by me on the following activities: development of treatment plan with patient and/or surrogate as well as nursing, discussions with consultants, evaluation of patient's response to treatment, examination of patient, obtaining history from patient or surrogate, ordering and performing treatments and interventions, ordering and review of laboratory studies, ordering and review of radiographic studies, pulse oximetry and re-evaluation of patient's condition.   D/w neuro dr Amada Jupiter.  Pt not a surgical candidate.  Needs ICU admission and BP control.  Also d/w intensivist in the ED.  Pt stabilized in the ED.     Joya Gaskins, MD 03/05/12 1101

## 2012-03-05 NOTE — Progress Notes (Signed)
Agitation   Restrains ordered 

## 2012-03-06 ENCOUNTER — Inpatient Hospital Stay (HOSPITAL_COMMUNITY): Payer: Medicare Other

## 2012-03-06 DIAGNOSIS — I2510 Atherosclerotic heart disease of native coronary artery without angina pectoris: Secondary | ICD-10-CM

## 2012-03-06 DIAGNOSIS — Z87440 Personal history of urinary (tract) infections: Secondary | ICD-10-CM

## 2012-03-06 LAB — CBC
HCT: 27.6 % — ABNORMAL LOW (ref 36.0–46.0)
HCT: 30.1 % — ABNORMAL LOW (ref 36.0–46.0)
Hemoglobin: 10.3 g/dL — ABNORMAL LOW (ref 12.0–15.0)
MCHC: 34.8 g/dL (ref 30.0–36.0)
MCV: 87.5 fL (ref 78.0–100.0)
Platelets: 104 10*3/uL — ABNORMAL LOW (ref 150–400)
RBC: 3.44 MIL/uL — ABNORMAL LOW (ref 3.87–5.11)
RDW: 13.5 % (ref 11.5–15.5)
WBC: 6.2 10*3/uL (ref 4.0–10.5)
WBC: 9.7 10*3/uL (ref 4.0–10.5)

## 2012-03-06 LAB — BASIC METABOLIC PANEL
BUN: 18 mg/dL (ref 6–23)
CO2: 19 mEq/L (ref 19–32)
Chloride: 103 mEq/L (ref 96–112)
GFR calc Af Amer: 64 mL/min — ABNORMAL LOW (ref >90)
Glucose, Bld: 110 mg/dL — ABNORMAL HIGH (ref 70–99)
Potassium: 3.8 mEq/L (ref 3.5–5.1)

## 2012-03-06 LAB — PHENYTOIN LEVEL, TOTAL: Phenytoin Lvl: 13.8 ug/mL (ref 10.0–20.0)

## 2012-03-06 LAB — HEMOGLOBIN A1C: Mean Plasma Glucose: 117 mg/dL — ABNORMAL HIGH (ref <117)

## 2012-03-06 MED ORDER — PNEUMOCOCCAL VAC POLYVALENT 25 MCG/0.5ML IJ INJ
0.5000 mL | INJECTION | INTRAMUSCULAR | Status: AC
  Administered 2012-03-07: 0.5 mL via INTRAMUSCULAR
  Filled 2012-03-06: qty 0.5

## 2012-03-06 MED ORDER — TIMOLOL MALEATE 0.5 % OP SOLN
1.0000 [drp] | Freq: Every day | OPHTHALMIC | Status: DC
  Administered 2012-03-06 – 2012-03-11 (×6): 1 [drp] via OPHTHALMIC
  Filled 2012-03-06 (×3): qty 5

## 2012-03-06 MED ORDER — DEXTROSE 5 % IV SOLN
1.0000 g | INTRAVENOUS | Status: DC
  Administered 2012-03-06 – 2012-03-10 (×5): 1 g via INTRAVENOUS
  Filled 2012-03-06 (×5): qty 10

## 2012-03-06 MED ORDER — ENOXAPARIN SODIUM 40 MG/0.4ML ~~LOC~~ SOLN
40.0000 mg | SUBCUTANEOUS | Status: DC
  Administered 2012-03-06 – 2012-03-11 (×6): 40 mg via SUBCUTANEOUS
  Filled 2012-03-06 (×6): qty 0.4

## 2012-03-06 NOTE — Progress Notes (Signed)
Chaplain visited with pt's granddaughter at bedside while rounding in 3100. She is a [employer] representative in [employer], occupational. She said she is more calm about pt's condition than other family members. She is optimistic that pt will improve. Pt was sedated during my visit. I said I would come tomorrow and meet other family members who may be visiting then.

## 2012-03-06 NOTE — Progress Notes (Signed)
Chaplain visited with pt and pt's oldest daughter while rounding in 65. At first pt was asleep, but when she awoke I introduced myself and we visited briefly. Most of visit was with pt's daughter.

## 2012-03-06 NOTE — Progress Notes (Signed)
Echocardiogram 2D Echocardiogram has been performed.  Jayleana Colberg 03/06/2012, 11:28 AM

## 2012-03-06 NOTE — Progress Notes (Signed)
Stroke Team Progress Note  HISTORY Anne Hanna is a 78 y.o. female who was speaking on the phone earlier on 03/04/2012 and started speaking gibberish. Her daughter was called as was EMS and on arrival she was seen to have a seizure. She remained altered in the ER and was intubated, but has since had improvement. She currently is moving all 4 ext, but not following commands. A CT shows a small mesial temporal hemorrhage. She takes ASA but no other thinners.   SUBJECTIVE  She did well without any seizures. Blood pressure has been well controlled. She is weaning well and following commands this morning. MRI scan of the brain yesterday showed small stable left medial temporal hematoma with old remote age hemorrhagic left occipital infarct. MRA of the brain showed diffuse moderate atheromatous changes involving vertebral, basilar and posterior cerebral arteries.  OBJECTIVE Most recent Vital Signs: Filed Vitals:   03/06/12 0500 03/06/12 0600 03/06/12 0700 03/06/12 0800  BP: 118/33 115/36 109/36 102/58  Pulse: 62 61 59 64  Temp:      TempSrc:      Resp: 12 12 12 16   Height:      Weight: 66.86 kg (147 lb 6.4 oz)     SpO2: 100% 100% 100% 100%   CBG (last 3)   Basename 03/04/12 1844  GLUCAP 119*   IV Fluid Intake:     . sodium chloride 100 mL/hr at 03/06/12 0700  . propofol 30 mcg/kg/min (03/06/12 0700)   MEDICATIONS    . antiseptic oral rinse  1 application Mouth Rinse QID  . chlorhexidine  15 mL Mouth/Throat BID  . pantoprazole (PROTONIX) IV  40 mg Intravenous QHS  . phenytoin (DILANTIN) IV  100 mg Intravenous Q8H  . sodium chloride  500 mL Intravenous Once   PRN:  labetalol  Diet:  NPO  Activity:  Bedrest DVT Prophylaxis:  SCDs  CLINICALLY SIGNIFICANT STUDIES Basic Metabolic Panel:   Lab 03/06/12 0410 03/05/12 0605  NA 135 136  K 3.8 3.5  CL 103 101  CO2 19 24  GLUCOSE 110* 124*  BUN 18 23  CREATININE 0.91 1.11*  CALCIUM 8.5 8.8  MG 2.0 1.6  PHOS 3.3 3.2   Liver  Function Tests:   Lab 03/04/12 1904  AST 36  ALT 16  ALKPHOS 64  BILITOT 0.3  PROT 6.8  ALBUMIN 3.6   CBC:   Lab 03/06/12 0410 03/05/12 0605 03/04/12 1904  WBC 9.7 6.2 --  NEUTROABS -- -- 4.6  HGB 10.3* 9.6* --  HCT 30.1* 27.6* --  MCV 87.5 87.1 --  PLT 81* 104* --   Coagulation:   Lab 03/04/12 1904  LABPROT 15.3*  INR 1.23   Cardiac Enzymes:   Lab 03/04/12 1904  CKTOTAL --  CKMB --  CKMBINDEX --  TROPONINI <0.30   Urinalysis:   Lab 03/04/12 1909  COLORURINE YELLOW  LABSPEC 1.013  PHURINE 5.5  GLUCOSEU NEGATIVE  HGBUR MODERATE*  BILIRUBINUR NEGATIVE  KETONESUR NEGATIVE  PROTEINUR 100*  UROBILINOGEN 1.0  NITRITE NEGATIVE  LEUKOCYTESUR TRACE*   Lipid Panel No results found for this basename: chol,  trig,  hdl,  cholhdl,  vldl,  ldlcalc   HgbA1C  Lab Results  Component Value Date   HGBA1C 5.7* 03/05/2012    Urine Drug Screen:     Component Value Date/Time   LABOPIA NONE DETECTED 03/04/2012 1909   COCAINSCRNUR NONE DETECTED 03/04/2012 1909   LABBENZ NONE DETECTED 03/04/2012 1909   AMPHETMU NONE DETECTED  03/04/2012 1909   THCU NONE DETECTED 03/04/2012 1909   LABBARB NONE DETECTED 03/04/2012 1909    Alcohol Level: No results found for this basename: ETH:2 in the last 168 hours  CT Head 03/04/2012   11 x 8 mm focus of hemorrhage in the left hippocampus.  Atrophy with extensive small vessel ischemic changes and intracranial atherosclerosis.  Very mild progression of ventriculomegaly since 2010.     MRI of the brain  03/05/2012  1.2 x 1 x 1 cm hematoma medial left temporal lobe (uncus) with mild surrounding vasogenic edema.  Breakthrough of hemorrhage into the left temporal horn with dependent blood noted.  Remote infarcts, small vessel disease type changes and atrophy  MRA of the brain  03/05/2012  No abnormal vessels seen extending to the left temporal lobe (uncus) hematoma).  Intracranial atherosclerotic type changes most notable involving the  basilar artery  2D Echocardiogram  pending  Carotid Doppler  pending  CXR  03/06/2012  03/04/2012 1.  Endotracheal tube tip just below the level of clavicles. 2.  Lower lung volumes, otherwise no acute cardiopulmonary abnormality identified.    EKG  normal EKG, normal sinus rhythm, unchanged from previous tracings, normal sinus rhythm, nonspecific ST and T waves changes.   Therapy Recommendations - pending  Physical Exam   frail elderly Caucasian lady who's intubated and sedated. . Afebrile. Head is nontraumatic. Neck is supple without bruit. . Cardiac exam no murmur or gallop. Lungs are clear to auscultation. Distal pulses are well felt.  Neurological Exam : sedated on propofol. Eyes are closed but responds to verbal stimuli and follows commands well in all 4 extremities. Fundi were not visualized. Pupils are 2 mm sluggishly reactive. Doll's eye movements are preserved. There is no gaze deviation.  . She has purposeful movements in all 4 limbs. No focal weakness. Plantars show withdrawal response bilaterally  ASSESSMENT [name] is a 78 y.o. female presenting with gibberish speech and seizure.  Imaging confirms a small mesial temporal hemorrhage. Etiology likely hemorrhagic infarct from intracranial atherosclerotic disease.  Work up underway. On clopidogrel 75 mg orally every day prior to admission. Now on no anticoagulation. Patient with resultant hemorrhagic infarct complicated by seizure activity. Intubated and sedated.  Hypertension  Hyperlipidemia  Hx CVA / TIA   Hospital day # 2  TREATMENT/PLAN  Extubate today if tolerated.  On Dilantin for seizures. Check level today  Monitor Blood pressure  EEG for silent seizures  Changed SCDs to Lovenox for DVT prophylaxis   Discussed with patient's son in law  and Dr. Kendrick Fries     This patient is critically ill and at significant risk of neurological worsening, death and care requires constant monitoring of vital  signs, hemodynamics,respiratory and cardiac monitoring,review of multiple databases, neurological assessment, discussion with family, other specialists and medical decision making of high complexity. I spent 30 minutes of neurocritical care time  in the care of  this patient.  Delia Heady, MD Medical Director [facility] Pager: [phone] 03/06/2012 9:06 AM

## 2012-03-06 NOTE — Procedures (Signed)
Extubation Procedure Note  Patient Details:   Name: JADAMARIE BUTSON DOB: [birth year] MRN: [id number]   Airway Documentation:  Airway 7 mm (Active)  Secured at (cm) 22 cm 03/06/2012  8:30 AM  Measured From Lips 03/06/2012  8:30 AM  Secured Location Center 03/06/2012  8:30 AM  Secured By [employer] 03/06/2012  8:30 AM  Tube Holder Repositioned Yes 03/06/2012  8:30 AM  Cuff Pressure (cm H2O) 26 cm H2O 03/05/2012  7:15 PM  Site Condition Dry;Edema 03/06/2012  3:50 AM    Evaluation  O2 sats: stable throughout Complications: No apparent complications Patient did tolerate procedure well. Bilateral Breath Sounds: Rhonchi Suctioning: Airway Yes Pt extubated to a 4lpm [place], tolerating well   Melanee Spry 03/06/2012, 9:37 AM

## 2012-03-06 NOTE — Progress Notes (Addendum)
PULMONARY  / CRITICAL CARE MEDICINE  Name: Anne Hanna MRN: [id number] DOB: Apr 04, 1922    LOS: 2  REFERRING MD:  EDP  CHIEF COMPLAINT:  Acute encephalopathy / seizure  BRIEF PATIENT DESCRIPTION: 78 yo on Plavix seen in [facility] ED on 12/16 with aphasia and seizure.  Intubated for airway protection.  Head CT revealed small mesial temporal hemorrhage.   LEVEL OF CARE:  ICU PRIMARY SERVICE:  PCCM CONSULTANTS:  Neurology CODE STATUS:  Full DIET:  NPO DVT Px:  SCDs GI Px:  Protonix   LINES / TUBES: 12/16  OETT >> 12/18 12/16  Foley  CULTURES: 12/16 urine culture >> 100K+ CFU strep species  ANTIBIOTICS: 12/18 ceftriaxone >>   SIGNIFICANT EVENTS:  12/16   Seen in [facility] ED on 12/16 with aphasia and seizure.  Intubated for airway protection.  Head CT revealed small mesial temporal hemorrhage.   SUBJECTIVE/OVERNIGHT/INTERVAL HX 03/05/2012: Following commands on WUA. Tongue significantly swollen and bruised post seizure. MRI due today. SBT pending 12/18 extubated   VITAL SIGNS: Temp:  [97.7 F (36.5 C)-98.7 F (37.1 C)] 98 F (36.7 C) (12/18 0830) Pulse Rate:  [55-77] 74  (12/18 0900) Resp:  [12-19] 18  (12/18 0900) BP: (102-124)/(33-58) 102/58 mmHg (12/18 0830) SpO2:  [99 %-100 %] 100 % (12/18 0900) FiO2 (%):  [30 %-40 %] 30 % (12/18 0830) Weight:  [66.86 kg (147 lb 6.4 oz)] 66.86 kg (147 lb 6.4 oz) (12/18 0500)  HEMODYNAMICS:   VENTILATOR SETTINGS: Vent Mode:  [-] CPAP;PSV FiO2 (%):  [30 %-40 %] 30 % Set Rate:  [12 bmp] 12 bmp Vt Set:  [460 mL] 460 mL PEEP:  [5 cmH20] 5 cmH20 Pressure Support:  [5 cmH20-8 cmH20] 5 cmH20 Plateau Pressure:  [15 cmH20-18 cmH20] 17 cmH20  INTAKE / OUTPUT: Intake/Output      12/17 0701 - 12/18 0700 12/18 0701 - 12/19 0700   I.V. (mL/kg) 2621.9 (39.2) 105.1 (1.6)   IV Piggyback 2    Total Intake(mL/kg) 2623.9 (39.2) 105.1 (1.6)   Urine (mL/kg/hr) 710 (0.4) 30   Total Output 710 30   Net +1913.9 +75.1          PHYSICAL  EXAMINATION: General:  Mechanically ventilated, synchronous Neuro:  Awake, alert, following commands HEENT:  PERRL, OETT . Tongue with slight bruise, not swollen Cardiovascular:  RRR, no m/r/g Lungs:  CTA B Abdomen:  Soft, nontender, bowel sounds present Musculoskeletal:  Moves all extremities, no edema Skin:  Intact  LABS:  Lab 03/06/12 0410 03/05/12 0605 03/04/12 1922 03/04/12 1904  HGB 10.3* 9.6* -- 10.7*  WBC 9.7 6.2 -- 6.7  PLT 81* 104* -- 137*  NA 135 136 -- 131*  K 3.8 3.5 -- --  CL 103 101 -- 91*  CO2 19 24 -- 16*  GLUCOSE 110* 124* -- 122*  BUN 18 23 -- 29*  CREATININE 0.91 1.11* -- 1.29*  CALCIUM 8.5 8.8 -- 9.7  MG 2.0 1.6 -- --  PHOS 3.3 3.2 -- --  AST -- -- -- 36  ALT -- -- -- 16  ALKPHOS -- -- -- 64  BILITOT -- -- -- 0.3  PROT -- -- -- 6.8  ALBUMIN -- -- -- 3.6  APTT -- -- -- 30  INR -- -- -- 1.23  LATICACIDVEN -- -- -- --  TROPONINI -- -- -- <0.30  PROCALCITON -- -- -- --  PROBNP -- -- -- --  O2SATVEN -- -- -- --  PHART -- -- 7.325* --  PCO2ART -- -- 39.5 --  PO2ART -- -- 466.0* --    Lab 03/04/12 1844  GLUCAP 119*   IMAGING: 12/16  PCXR >>> ETT in good position, no overt airspace disease 12/16  Head CT >>> Small left hippocampal hemorrhage.  Progressive chronic ventriculometry.  Ct Head Wo Contrast  03/04/2012  *RADIOLOGY REPORT*  Clinical Data: Seizure, unresponsive  CT HEAD WITHOUT CONTRAST  Technique:  Contiguous axial images were obtained from the base of the skull through the vertex without contrast.  Comparison: MRI brain dated 04/16/2008  Findings: 11 x 8 mm hyperdense lesion/hemorrhage in the medial left temporal lobe/hippocampus.  No evidence of extra-axial fluid collection.  No mass lesion, mass effect, or midline shift.  No CT evidence of acute infarction.  Extensive subcortical white matter and periventricular small vessel ischemic changes.  Intracranial atherosclerosis.  Global cortical and central atrophy.  Very mild progression of  ventriculomegaly since 2010.  The visualized paranasal sinuses are essentially clear. The mastoid air cells are unopacified.  No evidence of calvarial fracture.  IMPRESSION: 11 x 8 mm focus of hemorrhage in the left hippocampus.  Atrophy with extensive small vessel ischemic changes and intracranial atherosclerosis.  Very mild progression of ventriculomegaly since 2010.  These results were called by telephone on 03/04/2012 at 2010 hours to Dr. Zadie Rhine, who verbally acknowledged these results.   Original Report Authenticated By: Charline Bills, M.D.    Mr [facility] Wo Contrast  03/05/2012  *RADIOLOGY REPORT*  Clinical Data:  Aphasia and seizure.  On Plavix.  MRI BRAIN WITHOUT CONTRAST MRA HEAD WITHOUT CONTRAST  Technique: Multiplanar, multiecho pulse sequences of the brain and surrounding structures were obtained according to standard protocol without intravenous contrast.  Angiographic images of the head were obtained using MRA technique without contrast.  Comparison: 03/04/2012 and 06/21/2008 CT.  04/16/2008 MR.  MRI HEAD  Findings:  1.2 x 1 x 1 cm hematoma medial left temporal lobe (uncus) with mild surrounding vasogenic edema.  Breakthrough of hemorrhage into the left temporal horn with dependent blood noted. The cause of this hemorrhage is indeterminate and may be related to a hypertensive hemorrhage in this patient who is on blood thinner. No underlying masses seen at this level on the remote MR. Recommend follow-up until clearance.  No acute thrombotic infarct.  Remote partially hemorrhagic left occipital lobe infarct with encephalomalacia.  Remote small infarcts centrum semiovale bilaterally, basal ganglia bilaterally and the left thalamus.  Moderate small vessel disease type changes.  Global atrophy.  Ventricular prominence without significant change and may be related to atrophy rather than hydrocephalus.  Mild asymmetry cerebrospinal fluid spaces posterior fossa region greater on the left  unchanged.  Paranasal sinus mucosal thickening.  Cervical medullary junction, pituitary region, pineal region and orbital structures unremarkable.  IMPRESSION: 1.2 x 1 x 1 cm hematoma medial left temporal lobe (uncus) with mild surrounding vasogenic edema.  Breakthrough of hemorrhage into the left temporal horn with dependent blood noted.  Remote infarcts, small vessel disease type changes and atrophy as detailed above.  MRA HEAD  Findings: On source sequences, there are no abnormal vessels in the region of the left temporal lobe (uncus) hematoma.  Anterior circulation without medium or large size vessel significant stenosis or occlusion.  Mild narrowing A1 segment the left anterior cerebral artery.  Tiny bulge of the M1 segment of the middle cerebral artery bilaterally suggestive of origin of vessel rather than aneurysm.  Middle cerebral artery mild to moderate branch vessel irregularity bilaterally.  Mild irregularity vertebral arteries bilaterally.  High-grade stenosis proximal basilar artery with moderate stenosis mid basilar artery.  Nonvisualization AICAs.  Moderate narrowing proximal aspect right posterior cerebral artery with moderate narrowing mid to distal aspect posterior cerebral artery bilaterally.  Irregularity of the superior cerebellar artery bilaterally.  IMPRESSION: No abnormal vessels seen extending to the left temporal lobe (uncus) hematoma).  Intracranial atherosclerotic type changes most notable involving the basilar artery as detailed above.   Original Report Authenticated By: Lacy Duverney, M.D.    Mr Brain Wo Contrast  03/05/2012  *RADIOLOGY REPORT*  Clinical Data:  Aphasia and seizure.  On Plavix.  MRI BRAIN WITHOUT CONTRAST MRA HEAD WITHOUT CONTRAST  Technique: Multiplanar, multiecho pulse sequences of the brain and surrounding structures were obtained according to standard protocol without intravenous contrast.  Angiographic images of the head were obtained using MRA technique without  contrast.  Comparison: 03/04/2012 and 06/21/2008 CT.  04/16/2008 MR.  MRI HEAD  Findings:  1.2 x 1 x 1 cm hematoma medial left temporal lobe (uncus) with mild surrounding vasogenic edema.  Breakthrough of hemorrhage into the left temporal horn with dependent blood noted. The cause of this hemorrhage is indeterminate and may be related to a hypertensive hemorrhage in this patient who is on blood thinner. No underlying masses seen at this level on the remote MR. Recommend follow-up until clearance.  No acute thrombotic infarct.  Remote partially hemorrhagic left occipital lobe infarct with encephalomalacia.  Remote small infarcts centrum semiovale bilaterally, basal ganglia bilaterally and the left thalamus.  Moderate small vessel disease type changes.  Global atrophy.  Ventricular prominence without significant change and may be related to atrophy rather than hydrocephalus.  Mild asymmetry cerebrospinal fluid spaces posterior fossa region greater on the left unchanged.  Paranasal sinus mucosal thickening.  Cervical medullary junction, pituitary region, pineal region and orbital structures unremarkable.  IMPRESSION: 1.2 x 1 x 1 cm hematoma medial left temporal lobe (uncus) with mild surrounding vasogenic edema.  Breakthrough of hemorrhage into the left temporal horn with dependent blood noted.  Remote infarcts, small vessel disease type changes and atrophy as detailed above.  MRA HEAD  Findings: On source sequences, there are no abnormal vessels in the region of the left temporal lobe (uncus) hematoma.  Anterior circulation without medium or large size vessel significant stenosis or occlusion.  Mild narrowing A1 segment the left anterior cerebral artery.  Tiny bulge of the M1 segment of the middle cerebral artery bilaterally suggestive of origin of vessel rather than aneurysm.  Middle cerebral artery mild to moderate branch vessel irregularity bilaterally.  Mild irregularity vertebral arteries bilaterally.  High-grade  stenosis proximal basilar artery with moderate stenosis mid basilar artery.  Nonvisualization AICAs.  Moderate narrowing proximal aspect right posterior cerebral artery with moderate narrowing mid to distal aspect posterior cerebral artery bilaterally.  Irregularity of the superior cerebellar artery bilaterally.  IMPRESSION: No abnormal vessels seen extending to the left temporal lobe (uncus) hematoma).  Intracranial atherosclerotic type changes most notable involving the basilar artery as detailed above.   Original Report Authenticated By: Lacy Duverney, M.D.    Dg Chest Port 1 View  03/06/2012  *RADIOLOGY REPORT*  Clinical Data: Evaluate endotracheal tube  PORTABLE CHEST - 1 VIEW  Comparison: Prior chest x-ray 03/04/2012  Findings: Endotracheal tube terminates 3 cm above the carina. Interval placement of a nasogastric tube.  The tip is not imaged, but lies below the diaphragm likely within the stomach.  Surgical changes of median sternotomy and multivessel CABG  including LIMA bypass.  Improved inspiratory volumes with decreasing basilar atelectasis.  Chronic apical and basilar lung changes appears similar to prior.  IMPRESSION:  1.  Slightly improved inspiratory volumes with decreasing basilar atelectasis. 2.  Interval placement of a nasogastric tube.  The tip is not imaged, but lies below the diaphragm likely within the stomach. 3.  Endotracheal tube in unchanged position 3 cm above the carina.   Original Report Authenticated By: Malachy Moan, M.D.    Dg Chest Port 1 View  03/04/2012  *RADIOLOGY REPORT*  Clinical Data: 77 year old female shortness of breath, and conscious.  PORTABLE CHEST - 1 VIEW  Comparison: 06/21/2008 and earlier.  Findings: Portable supine view 2007 hours.  Endotracheal tube tip just below the level of clavicles.  Lower lung volumes.  Stable cardiac size and mediastinal contours.  Sequelae of CABG.  Apical scarring is stable.  No pneumothorax, effusion, or consolidation. No  pulmonary edema.  IMPRESSION: 1.  Endotracheal tube tip just below the level of clavicles. 2.  Lower lung volumes, otherwise no acute cardiopulmonary abnormality identified.   Original Report Authenticated By: Erskine Speed, M.D.      ECG:  12/16 >>> NSR, no st wave changes  DIAGNOSES: Principal Problem:  *Cerebral brain hemorrhage Active Problems:  Coronary artery disease  Hypertension  Hypercholesterolemia  CVA (cerebral vascular accident)  Unspecified intracranial hemorrhage  Seizure  Acute respiratory failure  Acute encephalopathy  Anemia  ASSESSMENT / PLAN:  PULMONARY  A:  Acute respiratory failure - intubated for airway protection ON 03/04/2012 Good vent mechanics on 12/18 P:   Extubate 12/18  CARDIOVASCULAR    Lab 03/04/12 1904  TROPONINI <0.30     A: Hemodynamically stable. CAD s/p CABG.  HTN. Dyslipidemia. P:  Goal SBP < 160 per Neurology Labetalol PRN Hold Plavix as active hemorrhage Restart Crestor / Bystolic when able to take PO  RENAL  Lab 03/06/12 0410 03/05/12 0605 03/04/12 1904  NA 135 136 131*  K 3.8 3.5 --  CL 103 101 91*  CO2 19 24 16*  GLUCOSE 110* 124* 122*  BUN 18 23 29*  CREATININE 0.91 1.11* 1.29*  CALCIUM 8.5 8.8 9.7  MG 2.0 1.6 --  PHOS 3.3 3.2 --    .  A:  Dehydration.  Renal failure (acute vs chronic) improving P:   Trend BMP Decrease saline Hold HCTZ  GASTROINTESTINAL  A:  No active issues. P:   NPO other than meds Bedside swallow eval by RN and/or speech if needed  HEMATOLOGIC  Lab 03/06/12 0410 03/05/12 0605 03/04/12 1904  HGB 10.3* 9.6* 10.7*  HCT 30.1* 27.6* 31.0*  WBC 9.7 6.2 6.7  PLT 81* 104* 137*    A:  Anemia (acute vs chronic).  Thrombocytopenia, chronic. P:  Trend CBC - PRBC for hgb </= 6.9gm%    - exceptions are   -  if ACS susepcted/confirmed then transfuse for hgb </= 8.0gm%,  or    -  If septic shock first 24h and scvo2 < 70% then transfuse for hgb </= 9.0gm%   - active bleeding with  hemodynamic instability, then transfuse regardless of hemoglobin value   At at all times try to transfuse 1 unit prbc as possible with exception of active hemorrhage    INFECTIOUS  A:  UTI? Negative u/a but strep species in urine culture P:   Add ceftriaxone 12/18  ENDOCRINE   A:  No active issues. P:   No interventions required  NEUROLOGIC  A:  Hippocampal hemorrhage.  Seizure.  History of CVA. P:   Phenytoin load and maintenance Goal RASS 0 to -1 Neuro checks MRI / EEG pending as of 03/05/12    CRITICAL CARE:  The patient is critically ill with multiple organ systems failure and requires high complexity decision making for assessment and support, frequent evaluation and titration of therapies, application of advanced monitoring technologies and extensive interpretation of multiple databases. Critical Care Time devoted to patient care services described in this note is 35 minutes.   Yolonda Kida PCCM Pager: [phone] Cell: [phone] If no response, call [phone]   03/06/2012 9:09 AM

## 2012-03-07 ENCOUNTER — Inpatient Hospital Stay (HOSPITAL_COMMUNITY): Payer: Medicare Other

## 2012-03-07 LAB — CBC WITH DIFFERENTIAL/PLATELET
Basophils Absolute: 0 10*3/uL (ref 0.0–0.1)
HCT: 26.9 % — ABNORMAL LOW (ref 36.0–46.0)
Hemoglobin: 9.4 g/dL — ABNORMAL LOW (ref 12.0–15.0)
Lymphocytes Relative: 12 % (ref 12–46)
Monocytes Absolute: 0.8 10*3/uL (ref 0.1–1.0)
Neutro Abs: 3.9 10*3/uL (ref 1.7–7.7)
RBC: 3.09 MIL/uL — ABNORMAL LOW (ref 3.87–5.11)
RDW: 13.6 % (ref 11.5–15.5)
WBC: 5.5 10*3/uL (ref 4.0–10.5)

## 2012-03-07 LAB — BASIC METABOLIC PANEL
Chloride: 105 mEq/L (ref 96–112)
Creatinine, Ser: 0.9 mg/dL (ref 0.50–1.10)
GFR calc Af Amer: 65 mL/min — ABNORMAL LOW (ref >90)
Potassium: 3 mEq/L — ABNORMAL LOW (ref 3.5–5.1)
Sodium: 136 mEq/L (ref 135–145)

## 2012-03-07 LAB — URINE CULTURE

## 2012-03-07 MED ORDER — POTASSIUM CHLORIDE 10 MEQ/100ML IV SOLN
10.0000 meq | INTRAVENOUS | Status: AC
  Administered 2012-03-07 (×4): 10 meq via INTRAVENOUS
  Filled 2012-03-07: qty 400

## 2012-03-07 MED ORDER — TRIAMCINOLONE ACETONIDE 0.1 % EX CREA
1.0000 "application " | TOPICAL_CREAM | CUTANEOUS | Status: DC | PRN
  Filled 2012-03-07: qty 15

## 2012-03-07 MED ORDER — TIMOLOL MALEATE (ONCE-DAILY) 0.5 % OP SOLN: 1.0000 [drp] | Freq: Every day | OPHTHALMIC | Status: DC

## 2012-03-07 NOTE — Progress Notes (Signed)
Stroke Team Progress Note  HISTORY Anne Hanna is a 78 y.o. female who was speaking on the phone earlier on 03/04/2012 and started speaking gibberish. Her daughter was called as was EMS and on arrival she was seen to have a seizure. She remained altered in the ER and was intubated, but has since had improvement. She currently is moving all 4 ext, but not following commands. A CT shows a small mesial temporal hemorrhage. She takes ASA but no other thinners.   SUBJECTIVE  She did well with extubation y`day.Blood pressure has been well controlled. She has not had any more seizures. She does not remember the episode. She has had previous history of 2 strokes. OBJECTIVE Most recent Vital Signs: Filed Vitals:   03/07/12 0400 03/07/12 0500 03/07/12 0600 03/07/12 0700  BP: 142/47 145/44 136/46 135/44  Pulse: 76 70 65 70  Temp: 98.4 F (36.9 C)     TempSrc: Axillary     Resp: 19 16 17 20   Height:      Weight:      SpO2: 96% 96% 96% 99%   CBG (last 3)   Basename 03/04/12 1844  GLUCAP 119*   IV Fluid Intake:      . sodium chloride 50 mL/hr at 03/07/12 0700   MEDICATIONS     . antiseptic oral rinse  1 application Mouth Rinse QID  . cefTRIAXone (ROCEPHIN)  IV  1 g Intravenous Q24H  . chlorhexidine  15 mL Mouth/Throat BID  . enoxaparin (LOVENOX) injection  40 mg Subcutaneous Q24H  . pantoprazole (PROTONIX) IV  40 mg Intravenous QHS  . phenytoin (DILANTIN) IV  100 mg Intravenous Q8H  . pneumococcal 23 valent vaccine  0.5 mL Intramuscular Tomorrow-1000  . potassium chloride  10 mEq Intravenous Q1 Hr x 4  . timolol  1 drop Both Eyes Daily   PRN:  labetalol  Diet:  NPO  Activity:  Bedrest DVT Prophylaxis:  SCDs  CLINICALLY SIGNIFICANT STUDIES Basic Metabolic Panel:   Lab 03/07/12 0425 03/06/12 0410 03/05/12 0605  NA 136 135 --  K 3.0* 3.8 --  CL 105 103 --  CO2 21 19 --  GLUCOSE 86 110* --  BUN 16 18 --  CREATININE 0.90 0.91 --  CALCIUM 8.3* 8.5 --  MG -- 2.0 1.6  PHOS  -- 3.3 3.2   Liver Function Tests:   Lab 03/04/12 1904  AST 36  ALT 16  ALKPHOS 64  BILITOT 0.3  PROT 6.8  ALBUMIN 3.6   CBC:   Lab 03/07/12 0425 03/06/12 0410 03/04/12 1904  WBC 5.5 9.7 --  NEUTROABS 3.9 -- 4.6  HGB 9.4* 10.3* --  HCT 26.9* 30.1* --  MCV 87.1 87.5 --  PLT 90* 81* --   Coagulation:   Lab 03/04/12 1904  LABPROT 15.3*  INR 1.23   Cardiac Enzymes:   Lab 03/04/12 1904  CKTOTAL --  CKMB --  CKMBINDEX --  TROPONINI <0.30   Urinalysis:   Lab 03/04/12 1909  COLORURINE YELLOW  LABSPEC 1.013  PHURINE 5.5  GLUCOSEU NEGATIVE  HGBUR MODERATE*  BILIRUBINUR NEGATIVE  KETONESUR NEGATIVE  PROTEINUR 100*  UROBILINOGEN 1.0  NITRITE NEGATIVE  LEUKOCYTESUR TRACE*   Lipid Panel No results found for this basename: chol,  trig,  hdl,  cholhdl,  vldl,  ldlcalc   HgbA1C  Lab Results  Component Value Date   HGBA1C 5.7* 03/05/2012    Urine Drug Screen:     Component Value Date/Time   LABOPIA NONE  DETECTED 03/04/2012 1909   COCAINSCRNUR NONE DETECTED 03/04/2012 1909   LABBENZ NONE DETECTED 03/04/2012 1909   AMPHETMU NONE DETECTED 03/04/2012 1909   THCU NONE DETECTED 03/04/2012 1909   LABBARB NONE DETECTED 03/04/2012 1909    Alcohol Level: No results found for this basename: ETH:2 in the last 168 hours  CT Head 03/04/2012   11 x 8 mm focus of hemorrhage in the left hippocampus.  Atrophy with extensive small vessel ischemic changes and intracranial atherosclerosis.  Very mild progression of ventriculomegaly since 2010.     MRI of the brain  03/05/2012  1.2 x 1 x 1 cm hematoma medial left temporal lobe (uncus) with mild surrounding vasogenic edema.  Breakthrough of hemorrhage into the left temporal horn with dependent blood noted.  Remote infarcts, small vessel disease type changes and atrophy  MRA of the brain  03/05/2012  No abnormal vessels seen extending to the left temporal lobe (uncus) hematoma).  Intracranial atherosclerotic type changes most notable  involving the basilar artery  2D Echocardiogram no cardiac source of embolism. Normal ejection fraction. Carotid Doppler  pending Dilantin level 13.8 mg% CXR  03/06/2012  03/04/2012 1.  Endotracheal tube tip just below the level of clavicles. 2.  Lower lung volumes, otherwise no acute cardiopulmonary abnormality identified.    EKG  normal EKG, normal sinus rhythm, unchanged from previous tracings, normal sinus rhythm, nonspecific ST and T waves changes.   Therapy Recommendations - pending  Physical Exam   frail elderly Caucasian lady who's awake. . Afebrile. Head is nontraumatic. Hearing is diminished bilaterally.Neck is supple without bruit. . Cardiac exam no murmur or gallop. Lungs are clear to auscultation. Distal pulses are well felt. Tongue swelling has significantly diminished. Neurological Exam : Awake alert normal speech and language. Diminished recall 0/3 but hearing loss limits cognitive testing. Extraocular movements are full range without nystagmus. Fundi were not visualized. Visual acuity and fields appear normal. Face is symmetric without weakness. Tongue is midline. Tongue swelling has come down today. Motor system exam reveals no upper or lower eczema to drift and symmetric and equal strength in all 4 extremities. No focal weakness. Deep tendon reflexes symmetric. Plantars are downgoing. Gait was not tested.  BP 135/44  Pulse 70  Temp 98.4 F (36.9 C) (Axillary)  Resp 20  Ht 5\' 4"  (1.626 m)  Wt 66.86 kg (147 lb 6.4 oz)  BMI 25.30 kg/m2  SpO2 99%                                                                ASSESSMENT [name] is a 78 y.o. female presenting with gibberish speech and seizure.  Imaging confirms a small mesial temporal hemorrhage. Etiology likely hemorrhagic infarct from intracranial atherosclerotic disease.  Work up underway. On clopidogrel 75 mg orally every day prior to admission. Now on no anticoagulation. Patient with  resultant hemorrhagic infarct complicated by seizure activity. Intubated and sedated.  Hypertension  Hyperlipidemia  Hx CVA / TIA   Hospital day # 3  TREATMENT/PLAN.  On Dilantin for seizures.   level  optimal yesterday Monitor Blood pressure  Transfer to the floor today. Mobilize out of bed. Physical occupational therapy consults. Changed SCDs to Lovenox for DVT prophylaxis   Discussed with patient's daughter  Anne Heady, MD Medical Director [facility] Pager: [phone] 03/07/2012 8:38 AM

## 2012-03-07 NOTE — Progress Notes (Signed)
Portable EEG completed

## 2012-03-07 NOTE — Progress Notes (Signed)
eLink Physician-Brief Progress Note Patient Name: Anne Hanna DOB: 12/24/1922 MRN: [id number]  Date of Service  03/07/2012   HPI/Events of Note  Hypokalemia   eICU Interventions  Potassium replaced   Intervention Category Intermediate Interventions: Electrolyte abnormality - evaluation and management  DETERDING,ELIZABETH 03/07/2012, 5:12 AM

## 2012-03-07 NOTE — Progress Notes (Signed)
VASCULAR LAB PRELIMINARY  PRELIMINARY  PRELIMINARY  PRELIMINARY  Carotid duplex  completed.    Preliminary report:  Right:  40-59% internal carotid artery stenosis.   Left:  No evidence of hemodynamically significant internal carotid artery stenosis.  Bilateral:  Vertebral artery flow is antegrade.     Brittanni Cariker, RVT 03/07/2012, 11:53 AM

## 2012-03-07 NOTE — Evaluation (Signed)
Clinical/Bedside Swallow Evaluation Patient Details  Name: Anne Hanna MRN: [id number] Date of Birth: 09/14/1922  Today's Date: 03/07/2012 Time: 7829-5621 SLP Time Calculation (min): 35 min  Past Medical History:  Past Medical History  Diagnosis Date  . Coronary artery disease     STATUS POST CABG  . Hypertension   . Hypercholesterolemia   . CVA (cerebral vascular accident)     OCULAR CVA  . Hx: UTI (urinary tract infection)   . Glaucoma(365)     History of glaucoma  . Bronchopneumonia     Right lung  . Urinary tract infection     Escherichia coli urinary tract infection  . Acute sinusitis   . Thrombocytopenia 2010     platelet count of 96 at discharge (95 on admission)  . Hyperlipidemia   . History of transient ischemic attack   . Hyponatremia     volume depletion - resolved  . Volume overload     History of postoperative volume overload  . Delirium     Postoperative delirium  . Cataract    Past Surgical History:  Past Surgical History  Procedure Date  . Cardiac catheterization 10/05/2006    NORMAL LEFT VENTRICULAR SIZE. THERE IS MODERATE GLOBAL HYPOKINESIA WITH OVERALL EF 40%  . Coronary artery bypass graft 09/2006    LIMA GRAFT TO THE LAD, SAPHENOUS VEIN GRAFT TO THE FIRST OBTUSE MARGINAL VESSEL WITH SEQUENTIAL GRAFT TO THE SECOND MARGINAL VESSEL , AND SAPHENOUS VEIN GRAFT TO THE PDA  . Cataract surgery     Bilateral cataract extraction-- x2  . Tendon repair     Status post repair of tendon of the left index finger   HPI:  Anne Hanna is a 78 y.o. female who was speaking on the phone earlier on 03/04/2012 and started speaking gibberish. Her daughter was called as was EMS and on arrival she was seen to have a seizure. She remained altered in the ER and was intubated, but has since had improvement. She currently is moving all 4 ext, but not following commands. A CT shows a small left mesial temporal hemorrhage. She takes ASA but no other thinners.      Assessment / Plan / Recommendation Clinical Impression  Patient exhibits s/s of aspiration with nectar thick liquids and possibly applesauce.  Applesauce would not clear with multiple swallows, or use of chin tuck.  Patient could not cough up residue in throat, so SLP suctioned applesauce from oropharynx.  Patient is not safe for any p.o.'s at this time.  Question if this is transient due to intubation (patient does c/o pain with swallows) vs. being neurologic.  SLP will recheck for readiness for MBS in am (12/20).  Patient is not swallowing her own secretions consistenly.  Observed patient coughing and then suctioning saliva from mouth.      Aspiration Risk  Severe    Diet Recommendation NPO;Ice chips PRN after oral care        Other  Recommendations Recommended Consults: MBS   Follow Up Recommendations       Frequency and Duration        Pertinent Vitals/Pain n/a    SLP Swallow Goals     Swallow Study Prior Functional Status       General HPI: Anne Hanna is a 78 y.o. female who was speaking on the phone earlier on 03/04/2012 and started speaking gibberish. Her daughter was called as was EMS and on arrival she was seen to have a seizure. She  remained altered in the ER and was intubated, but has since had improvement. She currently is moving all 4 ext, but not following commands. A CT shows a small left mesial temporal hemorrhage. She takes ASA but no other thinners.   Type of Study: Bedside swallow evaluation Previous Swallow Assessment: N/A Diet Prior to this Study: NPO Temperature Spikes Noted: No Respiratory Status: Supplemental O2 delivered via (comment) History of Recent Intubation: Yes Length of Intubations (days): 2 days Date extubated: 03/06/12 Behavior/Cognition: Alert;Cooperative;Requires cueing;Hard of hearing Oral Cavity - Dentition: Dentures, not available Self-Feeding Abilities: Needs assist Patient Positioning: Upright in bed Baseline Vocal Quality:  Clear (Frequent coughing) Volitional Cough: Congested (Frequent coughing.) Volitional Swallow: Unable to elicit    Oral/Motor/Sensory Function Overall Oral Motor/Sensory Function: Appears within functional limits for tasks assessed Labial ROM: Within Functional Limits Labial Symmetry: Within Functional Limits Labial Strength: Within Functional Limits Labial Sensation: Within Functional Limits Lingual ROM: Reduced right;Reduced left Lingual Symmetry: Within Functional Limits Lingual Strength: Reduced (Laceration on left lateral tongue; hematomas t/o) Lingual Sensation: Reduced Facial ROM: Within Functional Limits Facial Symmetry: Within Functional Limits Facial Strength: Within Functional Limits Facial Sensation: Within Functional Limits Velum: Within Functional Limits Mandible: Within Functional Limits   Ice Chips Ice chips: Not tested   Thin Liquid Thin Liquid: Not tested    Nectar Thick Nectar Thick Liquid: Impaired Presentation: Spoon Pharyngeal Phase Impairments: Decreased hyoid-laryngeal movement;Multiple swallows;Wet Vocal Quality;Cough - Immediate;Suspected delayed Swallow   Honey Thick Honey Thick Liquid: Not tested   Puree Puree: Impaired Presentation: Spoon Pharyngeal Phase Impairments: Suspected delayed Swallow;Multiple swallows;Wet Vocal Quality;Throat Clearing - Immediate;Cough - Delayed (Suctioned puree from oropharynx) Other Comments: Suctioned applesauce from oropharynx   Solid   GO    Solid: Not tested       Maryjo Rochester T 03/07/2012,3:47 PM

## 2012-03-07 NOTE — Progress Notes (Addendum)
PULMONARY  / CRITICAL CARE MEDICINE  Name: Anne Hanna MRN: [id number] DOB: Oct 04, 1922    LOS: 3  REFERRING MD:  EDP  CHIEF COMPLAINT:  Acute encephalopathy / seizure  BRIEF PATIENT DESCRIPTION: 78 yo on Plavix seen in [facility] ED on 12/16 with aphasia and seizure.  Intubated for airway protection.  Head CT revealed small mesial temporal hemorrhage.   LEVEL OF CARE:  ICU PRIMARY SERVICE:  PCCM CONSULTANTS:  Neurology CODE STATUS:  Full DIET:  NPO DVT Px:  SCDs GI Px:  Protonix   LINES / TUBES: 12/16  OETT >> 12/18 12/16  Foley > planned 12/19  CULTURES: 12/16 urine culture >> 100K+ sTREP GROUP D, HIGH prob strep bovis 12/16 MRSA PCR - neg     ANTIBIOTICS: 12/18 ceftriaxone >>     SIGNIFICANT EVENTS:  12/16   Seen in [facility] ED on 12/16 with aphasia and seizure.  Intubated for airway protection.  Head CT revealed small mesial temporal hemorrhage. 03/05/2012: Following commands on WUA. Tongue significantly swollen and bruised post seizure. MRI due today. SBT pending 12/18 extubated    SUBJECTIVE/OVERNIGHT/INTERVAL HX 12/19 _ doing well post extubation. Tongue swelling better. Undergoing EEG  VITAL SIGNS: Temp:  [98.4 F (36.9 C)-99.5 F (37.5 C)] 98.4 F (36.9 C) (12/19 0400) Pulse Rate:  [65-78] 72  (12/19 1000) Resp:  [13-20] 20  (12/19 1000) BP: (115-145)/(40-53) 131/45 mmHg (12/19 1000) SpO2:  [92 %-99 %] 97 % (12/19 1000)    INTAKE / OUTPUT: Intake/Output      12/18 0701 - 12/19 0700 12/19 0701 - 12/20 0700   I.V. (mL/kg) 1355.1 (20.3) 150 (2.2)   IV Piggyback 58 50   Total Intake(mL/kg) 1413.1 (21.1) 200 (3)   Urine (mL/kg/hr) 1145 (0.7) 25   Total Output 1145 25   Net +268.1 +175          PHYSICAL EXAMINATION: General: Sleepy but comfotable Neuro:  Arousable, easily, following commands, moves all 4s HEENT:  PERRL, OETT . Tongue with slight bruise, not swollen Cardiovascular:  RRR, no m/r/g Lungs:  CTA B Abdomen:  Soft, nontender, bowel  sounds present Musculoskeletal:  Moves all extremities, no edema Skin:  Intact  LABS:  Lab 03/07/12 0425 03/06/12 0410 03/05/12 0605 03/04/12 1922 03/04/12 1904  HGB 9.4* 10.3* 9.6* -- --  WBC 5.5 9.7 6.2 -- --  PLT 90* 81* 104* -- --  NA 136 135 136 -- --  K 3.0* 3.8 -- -- --  CL 105 103 101 -- --  CO2 21 19 24  -- --  GLUCOSE 86 110* 124* -- --  BUN 16 18 23  -- --  CREATININE 0.90 0.91 1.11* -- --  CALCIUM 8.3* 8.5 8.8 -- --  MG -- 2.0 1.6 -- --  PHOS -- 3.3 3.2 -- --  AST -- -- -- -- 36  ALT -- -- -- -- 16  ALKPHOS -- -- -- -- 64  BILITOT -- -- -- -- 0.3  PROT -- -- -- -- 6.8  ALBUMIN -- -- -- -- 3.6  APTT -- -- -- -- 30  INR -- -- -- -- 1.23  LATICACIDVEN -- -- -- -- --  TROPONINI -- -- -- -- <0.30  PROCALCITON -- -- -- -- --  PROBNP -- -- -- -- --  O2SATVEN -- -- -- -- --  PHART -- -- -- 7.325* --  PCO2ART -- -- -- 39.5 --  PO2ART -- -- -- 466.0* --    Lab 03/04/12 1844  GLUCAP 119*   IMAGING: 12/16  PCXR >>> ETT in good position, no overt airspace disease 12/16  Head CT >>> Small left hippocampal hemorrhage.  Progressive chronic ventriculometry.  Mr Maxine Glenn Head Wo Contrast  03/05/2012  *RADIOLOGY REPORT*  Clinical Data:  Aphasia and seizure.  On Plavix.  MRI BRAIN WITHOUT CONTRAST MRA HEAD WITHOUT CONTRAST  Technique: Multiplanar, multiecho pulse sequences of the brain and surrounding structures were obtained according to standard protocol without intravenous contrast.  Angiographic images of the head were obtained using MRA technique without contrast.  Comparison: 03/04/2012 and 06/21/2008 CT.  04/16/2008 MR.  MRI HEAD  Findings:  1.2 x 1 x 1 cm hematoma medial left temporal lobe (uncus) with mild surrounding vasogenic edema.  Breakthrough of hemorrhage into the left temporal horn with dependent blood noted. The cause of this hemorrhage is indeterminate and may be related to a hypertensive hemorrhage in this patient who is on blood thinner. No underlying masses seen  at this level on the remote MR. Recommend follow-up until clearance.  No acute thrombotic infarct.  Remote partially hemorrhagic left occipital lobe infarct with encephalomalacia.  Remote small infarcts centrum semiovale bilaterally, basal ganglia bilaterally and the left thalamus.  Moderate small vessel disease type changes.  Global atrophy.  Ventricular prominence without significant change and may be related to atrophy rather than hydrocephalus.  Mild asymmetry cerebrospinal fluid spaces posterior fossa region greater on the left unchanged.  Paranasal sinus mucosal thickening.  Cervical medullary junction, pituitary region, pineal region and orbital structures unremarkable.  IMPRESSION: 1.2 x 1 x 1 cm hematoma medial left temporal lobe (uncus) with mild surrounding vasogenic edema.  Breakthrough of hemorrhage into the left temporal horn with dependent blood noted.  Remote infarcts, small vessel disease type changes and atrophy as detailed above.  MRA HEAD  Findings: On source sequences, there are no abnormal vessels in the region of the left temporal lobe (uncus) hematoma.  Anterior circulation without medium or large size vessel significant stenosis or occlusion.  Mild narrowing A1 segment the left anterior cerebral artery.  Tiny bulge of the M1 segment of the middle cerebral artery bilaterally suggestive of origin of vessel rather than aneurysm.  Middle cerebral artery mild to moderate branch vessel irregularity bilaterally.  Mild irregularity vertebral arteries bilaterally.  High-grade stenosis proximal basilar artery with moderate stenosis mid basilar artery.  Nonvisualization AICAs.  Moderate narrowing proximal aspect right posterior cerebral artery with moderate narrowing mid to distal aspect posterior cerebral artery bilaterally.  Irregularity of the superior cerebellar artery bilaterally.  IMPRESSION: No abnormal vessels seen extending to the left temporal lobe (uncus) hematoma).  Intracranial  atherosclerotic type changes most notable involving the basilar artery as detailed above.   Original Report Authenticated By: Lacy Duverney, M.D.    Mr Brain Wo Contrast  03/05/2012  *RADIOLOGY REPORT*  Clinical Data:  Aphasia and seizure.  On Plavix.  MRI BRAIN WITHOUT CONTRAST MRA HEAD WITHOUT CONTRAST  Technique: Multiplanar, multiecho pulse sequences of the brain and surrounding structures were obtained according to standard protocol without intravenous contrast.  Angiographic images of the head were obtained using MRA technique without contrast.  Comparison: 03/04/2012 and 06/21/2008 CT.  04/16/2008 MR.  MRI HEAD  Findings:  1.2 x 1 x 1 cm hematoma medial left temporal lobe (uncus) with mild surrounding vasogenic edema.  Breakthrough of hemorrhage into the left temporal horn with dependent blood noted. The cause of this hemorrhage is indeterminate and may be related to a hypertensive hemorrhage in this  patient who is on blood thinner. No underlying masses seen at this level on the remote MR. Recommend follow-up until clearance.  No acute thrombotic infarct.  Remote partially hemorrhagic left occipital lobe infarct with encephalomalacia.  Remote small infarcts centrum semiovale bilaterally, basal ganglia bilaterally and the left thalamus.  Moderate small vessel disease type changes.  Global atrophy.  Ventricular prominence without significant change and may be related to atrophy rather than hydrocephalus.  Mild asymmetry cerebrospinal fluid spaces posterior fossa region greater on the left unchanged.  Paranasal sinus mucosal thickening.  Cervical medullary junction, pituitary region, pineal region and orbital structures unremarkable.  IMPRESSION: 1.2 x 1 x 1 cm hematoma medial left temporal lobe (uncus) with mild surrounding vasogenic edema.  Breakthrough of hemorrhage into the left temporal horn with dependent blood noted.  Remote infarcts, small vessel disease type changes and atrophy as detailed above.   MRA HEAD  Findings: On source sequences, there are no abnormal vessels in the region of the left temporal lobe (uncus) hematoma.  Anterior circulation without medium or large size vessel significant stenosis or occlusion.  Mild narrowing A1 segment the left anterior cerebral artery.  Tiny bulge of the M1 segment of the middle cerebral artery bilaterally suggestive of origin of vessel rather than aneurysm.  Middle cerebral artery mild to moderate branch vessel irregularity bilaterally.  Mild irregularity vertebral arteries bilaterally.  High-grade stenosis proximal basilar artery with moderate stenosis mid basilar artery.  Nonvisualization AICAs.  Moderate narrowing proximal aspect right posterior cerebral artery with moderate narrowing mid to distal aspect posterior cerebral artery bilaterally.  Irregularity of the superior cerebellar artery bilaterally.  IMPRESSION: No abnormal vessels seen extending to the left temporal lobe (uncus) hematoma).  Intracranial atherosclerotic type changes most notable involving the basilar artery as detailed above.   Original Report Authenticated By: Lacy Duverney, M.D.    Dg Chest Port 1 View  03/06/2012  *RADIOLOGY REPORT*  Clinical Data: Evaluate endotracheal tube  PORTABLE CHEST - 1 VIEW  Comparison: Prior chest x-ray 03/04/2012  Findings: Endotracheal tube terminates 3 cm above the carina. Interval placement of a nasogastric tube.  The tip is not imaged, but lies below the diaphragm likely within the stomach.  Surgical changes of median sternotomy and multivessel CABG including LIMA bypass.  Improved inspiratory volumes with decreasing basilar atelectasis.  Chronic apical and basilar lung changes appears similar to prior.  IMPRESSION:  1.  Slightly improved inspiratory volumes with decreasing basilar atelectasis. 2.  Interval placement of a nasogastric tube.  The tip is not imaged, but lies below the diaphragm likely within the stomach. 3.  Endotracheal tube in unchanged  position 3 cm above the carina.   Original Report Authenticated By: Malachy Moan, M.D.      ECG:  12/16 >>> NSR, no st wave changes  DIAGNOSES: Principal Problem:  *Cerebral brain hemorrhage Active Problems:  Coronary artery disease  Hypertension  Hypercholesterolemia  CVA (cerebral vascular accident)  Unspecified intracranial hemorrhage  Seizure  Acute respiratory failure  Acute encephalopathy  Anemia  ASSESSMENT / PLAN:  PULMONARY  A:  Acute respiratory failure - intubated for airway protection ON 03/04/2012. Extubated 03/06/12 On 03/07/12: Maintaining airway with tongue swelling reduced   PLAN monitor  CARDIOVASCULAR    Lab 03/04/12 1904  TROPONINI <0.30     A: Hemodynamically stable. CAD s/p CABG.  HTN. Dyslipidemia. P:  Goal SBP < 160 per Neurology Labetalol PRN Hold Plavix as active hemorrhage Restart Crestor / Bystolic when able to take PO  RENAL  Lab 03/07/12 0425 03/06/12 0410 03/05/12 0605 03/04/12 1904  NA 136 135 136 131*  K 3.0* 3.8 -- --  CL 105 103 101 91*  CO2 21 19 24  16*  GLUCOSE 86 110* 124* 122*  BUN 16 18 23  29*  CREATININE 0.90 0.91 1.11* 1.29*  CALCIUM 8.3* 8.5 8.8 9.7  MG -- 2.0 1.6 --  PHOS -- 3.3 3.2 --    .  A:  Dehydration.  Renal failure (acute vs chronic) improving On 03/07/12: k replted by elink P:   Trend BMP Decrease saline to kvo Hold HCTZ for now, restart when able  GASTROINTESTINAL  A:  No active issues. P:   NPO other than meds Bedside swallow eval by RN and/or speech if needed; to be done 03/07/12  HEMATOLOGIC  Lab 03/07/12 0425 03/06/12 0410 03/05/12 0605  HGB 9.4* 10.3* 9.6*  HCT 26.9* 30.1* 27.6*  WBC 5.5 9.7 6.2  PLT 90* 81* 104*    A:  Anemia (acute vs chronic).  Thrombocytopenia, chronic. P:  Trend CBC - PRBC for hgb </= 6.9gm%    - exceptions are   -  if ACS susepcted/confirmed then transfuse for hgb </= 8.0gm%,  or    -  If septic shock first 24h and scvo2 < 70% then transfuse  for hgb </= 9.0gm%   - active bleeding with hemodynamic instability, then transfuse regardless of hemoglobin value   At at all times try to transfuse 1 unit prbc as possible with exception of active hemorrhage    INFECTIOUS  A:  UTI? Negative u/a but strep species in urine culture P:    ceftriaxone 12/18 If strep bovis confirmed, ? Consider eval for colon cancer  ENDOCRINE   A:  No active issues. P:   No interventions required  NEUROLOGIC  A:  Hippocampal hemorrhage.  Seizure.  History of CVA. P:   Phenytoin load and maintenance Goal RASS 0 to -1 Neuro checks MRI / EEG pending as of 03/05/12  Move to floor neuro under triad. D/w dr Butler Denmark   Dr. Kalman Shan, M.D., [facility].C.P Pulmonary and Critical Care Medicine Staff Physician [facility] [place] [facility]monary and Critical Care Pager: [phone], If no answer or between  15:00h - 7:00h: call [phone]  03/07/2012 10:39 AM

## 2012-03-08 ENCOUNTER — Inpatient Hospital Stay (HOSPITAL_COMMUNITY): Payer: Medicare Other

## 2012-03-08 DIAGNOSIS — I6350 Cerebral infarction due to unspecified occlusion or stenosis of unspecified cerebral artery: Secondary | ICD-10-CM

## 2012-03-08 DIAGNOSIS — N390 Urinary tract infection, site not specified: Secondary | ICD-10-CM

## 2012-03-08 DIAGNOSIS — R569 Unspecified convulsions: Secondary | ICD-10-CM

## 2012-03-08 LAB — CBC
HCT: 27.3 % — ABNORMAL LOW (ref 36.0–46.0)
Hemoglobin: 9.5 g/dL — ABNORMAL LOW (ref 12.0–15.0)
MCHC: 34.8 g/dL (ref 30.0–36.0)

## 2012-03-08 LAB — BASIC METABOLIC PANEL
BUN: 18 mg/dL (ref 6–23)
Chloride: 104 mEq/L (ref 96–112)
GFR calc non Af Amer: 61 mL/min — ABNORMAL LOW (ref >90)
Glucose, Bld: 91 mg/dL (ref 70–99)
Potassium: 3.2 mEq/L — ABNORMAL LOW (ref 3.5–5.1)

## 2012-03-08 LAB — GLUCOSE, CAPILLARY: Glucose-Capillary: 90 mg/dL (ref 70–99)

## 2012-03-08 MED ORDER — POTASSIUM CHLORIDE 10 MEQ/100ML IV SOLN
10.0000 meq | INTRAVENOUS | Status: AC
  Administered 2012-03-08 (×3): 10 meq via INTRAVENOUS
  Filled 2012-03-08 (×3): qty 100

## 2012-03-08 NOTE — Progress Notes (Signed)
Speech Language Pathology Dysphagia Treatment Patient Details Name: Anne Hanna MRN: [id number] DOB: Jan 16, 1923 Today's Date: 03/08/2012 Time: 4098-1191 SLP Time Calculation (min): 15 min  Assessment / Plan / Recommendation Clinical Impression  Patient continues to present with s/s of decreased airway protection with clinician provided diagnostic po trials characterized by cough post swallow with all pos. Should be noted that patient coughing prior to po consumption despite clear vocal quality. Mulitple swallows, excessive belching, and c/o some dysphagia prior to admission raises suspicion for possible esophageal dysfunction which may now be exacerbated by acute CVA. Patient alert and able to follow clinician cues for use of compensatory strategies. She is appropriate for objective testing today to determine origin of dysphagia and least restrictive diet if possible.     Diet Recommendation  Continue with Current Diet: NPO    SLP Plan MBS   Pertinent Vitals/Pain n/a       General Temperature Spikes Noted: No Respiratory Status: Room air Behavior/Cognition: Alert;Cooperative;Pleasant mood;Hard of hearing Oral Cavity - Dentition: Dentures, not available Patient Positioning: Upright in bed   Dysphagia Treatment Treatment focused on: Upgraded PO texture trials Treatment Methods/Modalities: Skilled observation;Differential diagnosis Patient observed directly with PO's: Yes Type of PO's observed: Ice chips;Thin liquids;Dysphagia 1 (puree) Feeding: Able to feed self Liquids provided via: Cup;Teaspoon Pharyngeal Phase Signs & Symptoms: Multiple swallows;Audible swallow;Immediate cough;Complaints of residue;Complaints of globus Type of cueing: Verbal Amount of cueing: Minimal   GO   Ferdinand Lango MA, CCC-SLP [phone]   Anne Hanna 03/08/2012, 8:50 AM

## 2012-03-08 NOTE — Procedures (Signed)
Objective Swallowing Evaluation: Modified Barium Swallowing Study  Patient Details  Name: Anne Hanna MRN: [id number] Date of Birth: 1922/08/31  Today's Date: 03/08/2012 Time: 1115-1130 SLP Time Calculation (min): 15 min  Past Medical History:  Past Medical History  Diagnosis Date  . Coronary artery disease     STATUS POST CABG  . Hypertension   . Hypercholesterolemia   . CVA (cerebral vascular accident)     OCULAR CVA  . Hx: UTI (urinary tract infection)   . Glaucoma(365)     History of glaucoma  . Bronchopneumonia     Right lung  . Urinary tract infection     Escherichia coli urinary tract infection  . Acute sinusitis   . Thrombocytopenia 2010     platelet count of 96 at discharge (95 on admission)  . Hyperlipidemia   . History of transient ischemic attack   . Hyponatremia     volume depletion - resolved  . Volume overload     History of postoperative volume overload  . Delirium     Postoperative delirium  . Cataract    Past Surgical History:  Past Surgical History  Procedure Date  . Cardiac catheterization 10/05/2006    NORMAL LEFT VENTRICULAR SIZE. THERE IS MODERATE GLOBAL HYPOKINESIA WITH OVERALL EF 40%  . Coronary artery bypass graft 09/2006    LIMA GRAFT TO THE LAD, SAPHENOUS VEIN GRAFT TO THE FIRST OBTUSE MARGINAL VESSEL WITH SEQUENTIAL GRAFT TO THE SECOND MARGINAL VESSEL , AND SAPHENOUS VEIN GRAFT TO THE PDA  . Cataract surgery     Bilateral cataract extraction-- x2  . Tendon repair     Status post repair of tendon of the left index finger   HPI:  Anne Hanna is a 78 y.o. female who was speaking on the phone earlier on 03/04/2012 and started speaking gibberish. Her daughter was called as was EMS and on arrival she was seen to have a seizure. She remained altered in the ER and was intubated, but has since had improvement. She currently is moving all 4 ext, but not following commands. A CT shows a small left mesial temporal hemorrhage. She takes ASA but no  other thinners.       Assessment / Plan / Recommendation Clinical Impression  Dysphagia Diagnosis: Moderate pharyngeal phase dysphagia (with anatomical component) Clinical impression: Patient presents with what this SLP suspects is a baseline anatomical component (given c/o some dysphagia prior to admission) characterized by anterior curvature of cervical spine perhaps preventing full epiglottic deflection,  exacerbated by an acute moderate pharyngeal dysphagia resulting from CVA. In addition to anatomical variation, patient with decreased base of tongue strength, hyo-laryngeal elevation, and excursion resulting in moderate-severe vallecular residuals post swallow, which lead to intermittent flash penetration of liquids. Additionally, the weight of larger bolus size appears to be successful in reducing residuals however results in a greater delay in swallow initiation and deeper penetration. Penetrates however are consistently sensed and patient responds with a strong and effective throat clear to clear the airway as well as spontaneous dry swallows to clear pharynx. Recommend initiation of a dysphagia 3 (mechanical soft) diet with thin liquids with strict aspiration precautions. SLP will f/u at bedside for improved function and reinforcement of strategies.     Treatment Recommendation  Therapy as outlined in treatment plan below    Diet Recommendation Dysphagia 3 (Mechanical Soft);Thin liquid   Liquid Administration via: Cup;Straw Medication Administration: Crushed with puree Supervision: Patient able to self feed;Full supervision/cueing for compensatory strategies  Compensations: Slow rate;Small sips/bites;Multiple dry swallows after each bite/sip Postural Changes and/or Swallow Maneuvers: Seated upright 90 degrees;Upright 30-60 min after meal    Other  Recommendations Oral Care Recommendations: Oral care BID   Follow Up Recommendations  Other (comment) (TBD)    Frequency and Duration min 3x  week  2 weeks       SLP Swallow Goals Patient will utilize recommended strategies during swallow to increase swallowing safety with: Minimal assistance Swallow Study Goal #2 - Progress: Not met   General HPI: Anne Hanna is a 78 y.o. female who was speaking on the phone earlier on 03/04/2012 and started speaking gibberish. Her daughter was called as was EMS and on arrival she was seen to have a seizure. She remained altered in the ER and was intubated, but has since had improvement. She currently is moving all 4 ext, but not following commands. A CT shows a small left mesial temporal hemorrhage. She takes ASA but no other thinners.   Type of Study: Modified Barium Swallowing Study Reason for Referral: Objectively evaluate swallowing function Previous Swallow Assessment: F/u swallow assessment at bedside this am; patient continues to demonstrate s/s of aspiration.  Diet Prior to this Study: NPO Temperature Spikes Noted: No Respiratory Status: Room air History of Recent Intubation: Yes Length of Intubations (days): 2 days Date extubated: 03/06/12 Behavior/Cognition: Alert;Cooperative;Pleasant mood;Hard of hearing Oral Cavity - Dentition: Dentures, not available Oral Motor / Sensory Function: Within functional limits Self-Feeding Abilities: Able to feed self Patient Positioning: Upright in chair Baseline Vocal Quality: Clear Volitional Cough: Strong Volitional Swallow: Able to elicit Anatomy: Other (Comment) (anterior curvature of cervical spine) Pharyngeal Secretions: Not observed secondary MBS    Reason for Referral Objectively evaluate swallowing function   Oral Phase Oral Preparation/Oral Phase Oral Phase: WFL   Pharyngeal Phase Pharyngeal Phase Pharyngeal Phase: Impaired Pharyngeal - Thin Pharyngeal - Thin Cup: Delayed swallow initiation;Premature spillage to valleculae;Reduced anterior laryngeal mobility;Reduced laryngeal elevation;Reduced airway/laryngeal closure;Reduced  tongue base retraction;Penetration/Aspiration during swallow;Pharyngeal residue - valleculae Penetration/Aspiration details (thin cup): Material enters airway, remains ABOVE vocal cords then ejected out Pharyngeal - Thin Straw: Delayed swallow initiation;Premature spillage to pyriform sinuses;Reduced anterior laryngeal mobility;Reduced laryngeal elevation;Reduced airway/laryngeal closure;Reduced tongue base retraction;Penetration/Aspiration during swallow;Pharyngeal residue - valleculae Penetration/Aspiration details (thin straw): Material enters airway, CONTACTS cords then ejected out Pharyngeal - Solids Pharyngeal - Puree: Reduced anterior laryngeal mobility;Reduced laryngeal elevation;Reduced tongue base retraction;Pharyngeal residue - valleculae Pharyngeal - Mechanical Soft: Reduced anterior laryngeal mobility;Reduced laryngeal elevation;Reduced tongue base retraction;Pharyngeal residue - valleculae  Cervical Esophageal Phase    GO   Ferdinand Lango MA, CCC-SLP [phone]  Cervical Esophageal Phase Cervical Esophageal Phase: [facility]         Shahrukh Pasch Meryl 03/08/2012, 1:23 PM

## 2012-03-08 NOTE — Progress Notes (Signed)
Stroke Team Progress Note  Anne Hanna is a 78 y.o. female who was speaking on the phone earlier on 03/04/2012 and started speaking gibberish. Her daughter was called as was EMS and on arrival she was seen to have a seizure. She remained altered in the ER and was intubated, but has since had improvement. She currently is moving all 4 ext, but not following commands. A CT shows a small mesial temporal hemorrhage. She takes ASA but no other thinners.   SUBJECTIVE    OBJECTIVE Most recent Vital Signs: Filed Vitals:   03/07/12 2129 03/08/12 0207 03/08/12 0500 03/08/12 0550  BP: 156/50 120/41  139/48  Pulse: 73 76  71  Temp: 98.3 F (36.8 C) 98.5 F (36.9 C)  98.3 F (36.8 C)  TempSrc:      Resp: 18 18  18   Height:      Weight: 66.6 kg (146 lb 13.2 oz)  67.2 kg (148 lb 2.4 oz)   SpO2: 94% 95%  99%   CBG (last 3)   Basename 03/07/12 2141  GLUCAP 90   IV Fluid Intake:      . sodium chloride 20 mL/hr at 03/07/12 1100   MEDICATIONS     . antiseptic oral rinse  1 application Mouth Rinse QID  . cefTRIAXone (ROCEPHIN)  IV  1 g Intravenous Q24H  . chlorhexidine  15 mL Mouth/Throat BID  . enoxaparin (LOVENOX) injection  40 mg Subcutaneous Q24H  . pantoprazole (PROTONIX) IV  40 mg Intravenous QHS  . phenytoin (DILANTIN) IV  100 mg Intravenous Q8H  . timolol  1 drop Both Eyes Daily   PRN:  labetalol, triamcinolone cream  Diet:  NPO  Activity:  Bedrest DVT Prophylaxis:  lovenox  CLINICALLY SIGNIFICANT STUDIES Basic Metabolic Panel:   Lab 03/08/12 0440 03/07/12 0425 03/06/12 0410  NA 138 136 --  K 3.2* 3.0* --  CL 104 105 --  CO2 23 21 --  GLUCOSE 91 86 --  BUN 18 16 --  CREATININE 0.84 0.90 --  CALCIUM 8.6 8.3* --  MG 1.3* -- 2.0  PHOS 1.5* -- 3.3   Liver Function Tests:   Lab 03/04/12 1904  AST 36  ALT 16  ALKPHOS 64  BILITOT 0.3  PROT 6.8  ALBUMIN 3.6   CBC:   Lab 03/08/12 0440 03/07/12 0425 03/04/12 1904  WBC 4.5 5.5 --  NEUTROABS -- 3.9 4.6   HGB 9.5* 9.4* --  HCT 27.3* 26.9* --  MCV 86.1 87.1 --  PLT 104* 90* --   Coagulation:   Lab 03/04/12 1904  LABPROT 15.3*  INR 1.23   Cardiac Enzymes:   Lab 03/04/12 1904  CKTOTAL --  CKMB --  CKMBINDEX --  TROPONINI <0.30   Urinalysis:   Lab 03/04/12 1909  COLORURINE YELLOW  LABSPEC 1.013  PHURINE 5.5  GLUCOSEU NEGATIVE  HGBUR MODERATE*  BILIRUBINUR NEGATIVE  KETONESUR NEGATIVE  PROTEINUR 100*  UROBILINOGEN 1.0  NITRITE NEGATIVE  LEUKOCYTESUR TRACE*   Lipid Panel No results found for this basename: chol,  trig,  hdl,  cholhdl,  vldl,  ldlcalc   HgbA1C  Lab Results  Component Value Date   HGBA1C 5.7* 03/05/2012    Urine Drug Screen:     Component Value Date/Time   LABOPIA NONE DETECTED 03/04/2012 1909   COCAINSCRNUR NONE DETECTED 03/04/2012 1909   LABBENZ NONE DETECTED 03/04/2012 1909   AMPHETMU NONE DETECTED 03/04/2012 1909   THCU NONE DETECTED 03/04/2012 1909   LABBARB NONE  DETECTED 03/04/2012 1909    Dilantin 13.8  Alcohol Level: No results found for this basename: ETH:2 in the last 168 hours  CT Head 03/04/2012   11 x 8 mm focus of hemorrhage in the left hippocampus.  Atrophy with extensive small vessel ischemic changes and intracranial atherosclerosis.  Very mild progression of ventriculomegaly since 2010.     MRI of the brain  03/05/2012  1.2 x 1 x 1 cm hematoma medial left temporal lobe (uncus) with mild surrounding vasogenic edema.  Breakthrough of hemorrhage into the left temporal horn with dependent blood noted.  Remote infarcts, small vessel disease type changes and atrophy  MRA of the brain  03/05/2012  No abnormal vessels seen extending to the left temporal lobe (uncus) hematoma).  Intracranial atherosclerotic type changes most notable involving the basilar artery  2D Echocardiogram no cardiac source of embolism. Normal ejection fraction.  Carotid Doppler  Right: 40-59% internal carotid artery stenosis. Left: No evidence of  hemodynamically significant internal carotid artery stenosis. Bilateral: Vertebral artery flow is antegrade.   CXR  03/06/2012  03/04/2012 1.  Endotracheal tube tip just below the level of clavicles. 2.  Lower lung volumes, otherwise no acute cardiopulmonary abnormality identified.    EKG  normal EKG, normal sinus rhythm, unchanged from previous tracings, normal sinus rhythm, nonspecific ST and T waves changes.   Therapy Recommendations - pending  Physical Exam   frail elderly Caucasian lady who's awake. . Afebrile. Head is nontraumatic. Hearing is diminished bilaterally.Neck is supple without bruit. . Cardiac exam no murmur or gallop. Lungs are clear to auscultation. Distal pulses are well felt. Tongue swelling has significantly diminished. Neurological Exam : Awake alert normal speech and language. Diminished recall 0/3 but hearing loss limits cognitive testing. Extraocular movements are full range without nystagmus. Fundi were not visualized. Visual acuity and fields appear normal. Face is symmetric without weakness. Tongue is midline. Tongue swelling has come down today. Motor system exam reveals no upper or lower eczema to drift and symmetric and equal strength in all 4 extremities. No focal weakness. Deep tendon reflexes symmetric. Plantars are downgoing. Gait was not tested.    BP 139/48  Pulse 71  Temp 98.3 F (36.8 C) (Oral)  Resp 18  Ht 5\' 4"  (1.626 m)  Wt 67.2 kg (148 lb 2.4 oz)  BMI 25.43 kg/m2  SpO2 99%                                                              ASSESSMENT Anne Hanna is a 78 y.o. female presenting with gibberish speech and seizure.  Imaging confirms a small mesial temporal hemorrhage. Etiology likely hemorrhagic infarct from intracranial atherosclerotic disease.  Work up underway. On clopidogrel 75 mg orally every day prior to admission. Now on no anticoagulation. Patient with resultant hemorrhagic infarct complicated by seizure  activity.   Hypertension  Hyperlipidemia, on crestor PTA, restart when patient able to take oral medications  Hx CVA / TIA   Hypokalemia  Seizure activity post bleed, EEG read as normal. Patient remains on dilantin.  Anemia, GI evaluating patient.  Hospital day # 4  TREATMENT/PLAN.  Failed speech, remains NPO  Risk factor modification  Replete potassium per primary team  UTI, treated with abx.  Gwendolyn Lima. Manson Passey, PAC,  MBA, [facility] Pager: [phone] 03/08/2012 8:12 AM        I have personally examined this patient, reviewed data and agree with above Delia Heady, MD Medical Director [facility] Pager: [phone] 03/08/2012 12:00 PM

## 2012-03-08 NOTE — Procedures (Signed)
EEG NUMBER:  REFERRING PHYSICIAN:  Dr. Pearlean Brownie.  HISTORY:  An 78 year old female with seizures and small temporal hemorrhage.  MEDICATIONS:  Dilantin, Rocephin, Lovenox, Protonix.  CONDITIONS OF RECORDING:  This is a 16-channel EEG carried out with the patient in the awake, drowsy, and asleep states.  DESCRIPTION:  The patient is only awake briefly during the tracing. During this portion of the tracing, artifact is prominent throughout. There are only rare occasions when a posterior background rhythm can be evaluated.  It is poorly sustained and seen at a maximum of 10 Hz alpha. During the majority of the tracing, the patient is either drowsy or asleep.  During drowse, the background activity is slow and poorly organized.  Stage II sleep was noted with symmetrical sleep spindles and vertex central sharp transients.  Hyperventilation was not performed. Intermittent photic stimulation failed to elicit any change in the tracing.  IMPRESSION:  This is a normal EEG.  Wakefulness could not be fully evaluated due to artifact.          ______________________________ Thana Farr, MD    [id number] D:  03/08/2012 01:03:38  T:  03/08/2012 04:52:04  Job #:  [id number]

## 2012-03-08 NOTE — Progress Notes (Signed)
Patient ID: Anne Hanna  female  UJW:[id number]    DOB: 16-Dec-1922    DOA: 03/04/2012  PCP: [name], MD  Brief hospitalization course so far:   Patient is a 78 year old female who was admitted by critical care service on 03/04/2012. Patient had been talking on the phone on 03/04/2012 and started speaking gibberish, patient was brought to ED and was seen to have a seizure. She was intubated for airway protection. CT scan showed small mesial temporal hemorrhage. Patient was placed on IV Dilantin, she was extubated on 03/06/2012. In addition she was found to have UTI and urine culture showed high probability of Strept Bovis. Patient was transferred from [facility] and service to neuro floor. TR[facility] assumed care on 03/08/2012. Patient has been n.p.o. due to dysphagia.  Assessment/Plan: Principal Problem:  *Cerebral brain hemorrhage - Stroke service following, etiology likely hemorrhagic infarct from intracranial atherosclerotic disease. Patient was on Plavix prior to admission but not any anticoagulation.. - Discussed in detail with Dr. Pearlean Brownie, she can be started on aspirin 81 mg  after a week.   Active Problems:  Seizure: - Continue Dilantin, EEG read as normal, patient is on Dilantin IV as she is NPO   Coronary artery disease: No acute issues, not on any cardiac medications, NPO   Hypertension: Currently stable    Hypercholesterolemia: restart statin when taking PO  Acute respiratory failure: resolved, extubated on 12/18, sats 98% on RA  UTI : Urine culture positive for strept bovis ?suspicion for colon Ca - D/w GI, Dr Bosie Clos, pt is NPO, can be done out-patient or CT abd-pelvis to assess for colon mass. - Cont rocephin   Dysphagia:  - Speech therapy following, recommending MBS. - if patient fails, will need artificial feeding, PANDA/PEG, await MBS today   DVT Prophylaxis:lovenox  Code Status: FC  Disposition: not ready   Subjective: Patient alert and awake, denies any specific  complaints, NPO  Objective: Weight change:   Intake/Output Summary (Last 24 hours) at 03/08/12 1143 Last data filed at 03/07/12 1700  Gross per 24 hour  Intake      0 ml  Output    400 ml  Net   -400 ml   Blood pressure 140/48, pulse 70, temperature 98.4 F (36.9 C), temperature source Oral, resp. rate 18, height 5\' 4"  (1.626 m), weight 67.2 kg (148 lb 2.4 oz), SpO2 98.00%.  Physical Exam: General: Alert and awake, oriented x3, not in any acute distress. HEENT: anicteric sclera, pupils reactive to light and accommodation, EOMI, tongue bruises CVS: S1-S2 clear, no murmur rubs or gallops Chest: clear to auscultation bilaterally, no wheezing, rales or rhonchi Abdomen: soft nontender, nondistended, normal bowel sounds, no organomegaly Extremities: no cyanosis, clubbing or edema noted bilaterally Neuro: 5/5 strength in all 4 ext  Lab Results: Basic Metabolic Panel:  Lab 03/08/12 5621 03/07/12 0425  NA 138 136  K 3.2* 3.0*  CL 104 105  CO2 23 21  GLUCOSE 91 86  BUN 18 16  CREATININE 0.84 0.90  CALCIUM 8.6 8.3*  MG 1.3* --  PHOS 1.5* --   Liver Function Tests:  Lab 03/04/12 1904  AST 36  ALT 16  ALKPHOS 64  BILITOT 0.3  PROT 6.8  ALBUMIN 3.6   No results found for this basename: LIPASE:2,AMYLASE:2 in the last 168 hours No results found for this basename: AMMONIA:2 in the last 168 hours CBC:  Lab 03/08/12 0440 03/07/12 0425  WBC 4.5 5.5  NEUTROABS -- 3.9  HGB 9.5* 9.4*  HCT 27.3* 26.9*  MCV 86.1 87.1  PLT 104* 90*   Cardiac Enzymes:  Lab 03/04/12 1904  CKTOTAL --  CKMB --  CKMBINDEX --  TROPONINI <0.30   BNP: No components found with this basename: POCBNP:2 CBG:  Lab 03/07/12 2141 03/04/12 1844  GLUCAP 90 119*     Micro Results: Recent Results (from the past 240 hour(s))  URINE CULTURE     Status: Normal   Collection Time   03/04/12  7:09 PM      Component Value Range Status Comment   Specimen Description URINE, RANDOM   Final    Special  Requests NONE   Final    Culture  Setup Time 03/04/2012 20:19   Final    Colony Count >=100,000 COLONIES/ML   Final    Culture STREPTOCOCCUS GROUP D;high probability for S.bovis   Final    Report Status 03/07/2012 FINAL   Final    Organism ID, Bacteria STREPTOCOCCUS GROUP D;high probability for S.bovis   Final   MRSA PCR SCREENING     Status: Normal   Collection Time   03/04/12 11:46 PM      Component Value Range Status Comment   MRSA by PCR NEGATIVE  NEGATIVE Final     Studies/Results: Ct Head Wo Contrast  03/04/2012  *RADIOLOGY REPORT*  Clinical Data: Seizure, unresponsive  CT HEAD WITHOUT CONTRAST  Technique:  Contiguous axial images were obtained from the base of the skull through the vertex without contrast.  Comparison: MRI brain dated 04/16/2008  Findings: 11 x 8 mm hyperdense lesion/hemorrhage in the medial left temporal lobe/hippocampus.  No evidence of extra-axial fluid collection.  No mass lesion, mass effect, or midline shift.  No CT evidence of acute infarction.  Extensive subcortical white matter and periventricular small vessel ischemic changes.  Intracranial atherosclerosis.  Global cortical and central atrophy.  Very mild progression of ventriculomegaly since 2010.  The visualized paranasal sinuses are essentially clear. The mastoid air cells are unopacified.  No evidence of calvarial fracture.  IMPRESSION: 11 x 8 mm focus of hemorrhage in the left hippocampus.  Atrophy with extensive small vessel ischemic changes and intracranial atherosclerosis.  Very mild progression of ventriculomegaly since 2010.  These results were called by telephone on 03/04/2012 at 2010 hours to Dr. Zadie Rhine, who verbally acknowledged these results.   Original Report Authenticated By: Charline Bills, M.D.    Mr [facility] Wo Contrast  03/05/2012  *RADIOLOGY REPORT*  Clinical Data:  Aphasia and seizure.  On Plavix.  MRI BRAIN WITHOUT CONTRAST MRA HEAD WITHOUT CONTRAST  Technique: Multiplanar,  multiecho pulse sequences of the brain and surrounding structures were obtained according to standard protocol without intravenous contrast.  Angiographic images of the head were obtained using MRA technique without contrast.  Comparison: 03/04/2012 and 06/21/2008 CT.  04/16/2008 MR.  MRI HEAD  Findings:  1.2 x 1 x 1 cm hematoma medial left temporal lobe (uncus) with mild surrounding vasogenic edema.  Breakthrough of hemorrhage into the left temporal horn with dependent blood noted. The cause of this hemorrhage is indeterminate and may be related to a hypertensive hemorrhage in this patient who is on blood thinner. No underlying masses seen at this level on the remote MR. Recommend follow-up until clearance.  No acute thrombotic infarct.  Remote partially hemorrhagic left occipital lobe infarct with encephalomalacia.  Remote small infarcts centrum semiovale bilaterally, basal ganglia bilaterally and the left thalamus.  Moderate small vessel disease type changes.  Global atrophy.  Ventricular prominence  without significant change and may be related to atrophy rather than hydrocephalus.  Mild asymmetry cerebrospinal fluid spaces posterior fossa region greater on the left unchanged.  Paranasal sinus mucosal thickening.  Cervical medullary junction, pituitary region, pineal region and orbital structures unremarkable.  IMPRESSION: 1.2 x 1 x 1 cm hematoma medial left temporal lobe (uncus) with mild surrounding vasogenic edema.  Breakthrough of hemorrhage into the left temporal horn with dependent blood noted.  Remote infarcts, small vessel disease type changes and atrophy as detailed above.  MRA HEAD  Findings: On source sequences, there are no abnormal vessels in the region of the left temporal lobe (uncus) hematoma.  Anterior circulation without medium or large size vessel significant stenosis or occlusion.  Mild narrowing A1 segment the left anterior cerebral artery.  Tiny bulge of the M1 segment of the middle cerebral  artery bilaterally suggestive of origin of vessel rather than aneurysm.  Middle cerebral artery mild to moderate branch vessel irregularity bilaterally.  Mild irregularity vertebral arteries bilaterally.  High-grade stenosis proximal basilar artery with moderate stenosis mid basilar artery.  Nonvisualization AICAs.  Moderate narrowing proximal aspect right posterior cerebral artery with moderate narrowing mid to distal aspect posterior cerebral artery bilaterally.  Irregularity of the superior cerebellar artery bilaterally.  IMPRESSION: No abnormal vessels seen extending to the left temporal lobe (uncus) hematoma).  Intracranial atherosclerotic type changes most notable involving the basilar artery as detailed above.   Original Report Authenticated By: Lacy Duverney, M.D.    Mr Brain Wo Contrast  03/05/2012  *RADIOLOGY REPORT*  Clinical Data:  Aphasia and seizure.  On Plavix.  MRI BRAIN WITHOUT CONTRAST MRA HEAD WITHOUT CONTRAST  Technique: Multiplanar, multiecho pulse sequences of the brain and surrounding structures were obtained according to standard protocol without intravenous contrast.  Angiographic images of the head were obtained using MRA technique without contrast.  Comparison: 03/04/2012 and 06/21/2008 CT.  04/16/2008 MR.  MRI HEAD  Findings:  1.2 x 1 x 1 cm hematoma medial left temporal lobe (uncus) with mild surrounding vasogenic edema.  Breakthrough of hemorrhage into the left temporal horn with dependent blood noted. The cause of this hemorrhage is indeterminate and may be related to a hypertensive hemorrhage in this patient who is on blood thinner. No underlying masses seen at this level on the remote MR. Recommend follow-up until clearance.  No acute thrombotic infarct.  Remote partially hemorrhagic left occipital lobe infarct with encephalomalacia.  Remote small infarcts centrum semiovale bilaterally, basal ganglia bilaterally and the left thalamus.  Moderate small vessel disease type changes.   Global atrophy.  Ventricular prominence without significant change and may be related to atrophy rather than hydrocephalus.  Mild asymmetry cerebrospinal fluid spaces posterior fossa region greater on the left unchanged.  Paranasal sinus mucosal thickening.  Cervical medullary junction, pituitary region, pineal region and orbital structures unremarkable.  IMPRESSION: 1.2 x 1 x 1 cm hematoma medial left temporal lobe (uncus) with mild surrounding vasogenic edema.  Breakthrough of hemorrhage into the left temporal horn with dependent blood noted.  Remote infarcts, small vessel disease type changes and atrophy as detailed above.  MRA HEAD  Findings: On source sequences, there are no abnormal vessels in the region of the left temporal lobe (uncus) hematoma.  Anterior circulation without medium or large size vessel significant stenosis or occlusion.  Mild narrowing A1 segment the left anterior cerebral artery.  Tiny bulge of the M1 segment of the middle cerebral artery bilaterally suggestive of origin of vessel rather than aneurysm.  Middle cerebral artery mild to moderate branch vessel irregularity bilaterally.  Mild irregularity vertebral arteries bilaterally.  High-grade stenosis proximal basilar artery with moderate stenosis mid basilar artery.  Nonvisualization AICAs.  Moderate narrowing proximal aspect right posterior cerebral artery with moderate narrowing mid to distal aspect posterior cerebral artery bilaterally.  Irregularity of the superior cerebellar artery bilaterally.  IMPRESSION: No abnormal vessels seen extending to the left temporal lobe (uncus) hematoma).  Intracranial atherosclerotic type changes most notable involving the basilar artery as detailed above.   Original Report Authenticated By: Lacy Duverney, M.D.    Dg Chest Port 1 View  03/06/2012  *RADIOLOGY REPORT*  Clinical Data: Evaluate endotracheal tube  PORTABLE CHEST - 1 VIEW  Comparison: Prior chest x-ray 03/04/2012  Findings: Endotracheal  tube terminates 3 cm above the carina. Interval placement of a nasogastric tube.  The tip is not imaged, but lies below the diaphragm likely within the stomach.  Surgical changes of median sternotomy and multivessel CABG including LIMA bypass.  Improved inspiratory volumes with decreasing basilar atelectasis.  Chronic apical and basilar lung changes appears similar to prior.  IMPRESSION:  1.  Slightly improved inspiratory volumes with decreasing basilar atelectasis. 2.  Interval placement of a nasogastric tube.  The tip is not imaged, but lies below the diaphragm likely within the stomach. 3.  Endotracheal tube in unchanged position 3 cm above the carina.   Original Report Authenticated By: Malachy Moan, M.D.    Dg Chest Port 1 View  03/04/2012  *RADIOLOGY REPORT*  Clinical Data: 78 year old female shortness of breath, and conscious.  PORTABLE CHEST - 1 VIEW  Comparison: 06/21/2008 and earlier.  Findings: Portable supine view 2007 hours.  Endotracheal tube tip just below the level of clavicles.  Lower lung volumes.  Stable cardiac size and mediastinal contours.  Sequelae of CABG.  Apical scarring is stable.  No pneumothorax, effusion, or consolidation. No pulmonary edema.  IMPRESSION: 1.  Endotracheal tube tip just below the level of clavicles. 2.  Lower lung volumes, otherwise no acute cardiopulmonary abnormality identified.   Original Report Authenticated By: Erskine Speed, M.D.     Medications: Scheduled Meds:   . antiseptic oral rinse  1 application Mouth Rinse QID  . cefTRIAXone (ROCEPHIN)  IV  1 g Intravenous Q24H  . chlorhexidine  15 mL Mouth/Throat BID  . enoxaparin (LOVENOX) injection  40 mg Subcutaneous Q24H  . pantoprazole (PROTONIX) IV  40 mg Intravenous QHS  . phenytoin (DILANTIN) IV  100 mg Intravenous Q8H  . timolol  1 drop Both Eyes Daily      LOS: 4 days   Caasi Giglia M.D. [facility] 03/08/2012, 11:43 AM Pager: [phone]  If 7PM-7AM, please contact  night-coverage [email or web address] Password [email or web address]

## 2012-03-08 NOTE — Consult Note (Signed)
Referring Provider: Dr. Isidoro Donning Primary Care Physician:  [name], MD Primary Gastroenterologist:  Gentry Fitz  Reason for Consultation:  Strep. bovis  HPI: Anne Hanna is a 78 y.o. female who presented with aphasia and seizure on 03/04/12 (on Plavix) and was intubated for airway protection. Patient was found to have a small cerebral hemorrhage that has been medically managed. She had Streptococcus group D with "high probability of Strep bovis" grow from her urine culture. Minimal history from patient. She denies abdominal pain. Daughter reports her mental status is at the baseline it has been for the past 17 years. She has never had a colonoscopy. No history of rectal bleeding. Patient is currently NPO as recommended by speech pathology and is undergoing workup for her trouble swallowing with a barium swallow today.      Past Medical History  Diagnosis Date  . Coronary artery disease     STATUS POST CABG  . Hypertension   . Hypercholesterolemia   . CVA (cerebral vascular accident)     OCULAR CVA  . Hx: UTI (urinary tract infection)   . Glaucoma(365)     History of glaucoma  . Bronchopneumonia     Right lung  . Urinary tract infection     Escherichia coli urinary tract infection  . Acute sinusitis   . Thrombocytopenia 2010     platelet count of 96 at discharge (95 on admission)  . Hyperlipidemia   . History of transient ischemic attack   . Hyponatremia     volume depletion - resolved  . Volume overload     History of postoperative volume overload  . Delirium     Postoperative delirium  . Cataract     Past Surgical History  Procedure Date  . Cardiac catheterization 10/05/2006    NORMAL LEFT VENTRICULAR SIZE. THERE IS MODERATE GLOBAL HYPOKINESIA WITH OVERALL EF 40%  . Coronary artery bypass graft 09/2006    LIMA GRAFT TO THE LAD, SAPHENOUS VEIN GRAFT TO THE FIRST OBTUSE MARGINAL VESSEL WITH SEQUENTIAL GRAFT TO THE SECOND MARGINAL VESSEL , AND SAPHENOUS VEIN GRAFT TO  THE PDA  . Cataract surgery     Bilateral cataract extraction-- x2  . Tendon repair     Status post repair of tendon of the left index finger    Prior to Admission medications   Medication Sig Start Date End Date Taking? Authorizing Provider  clopidogrel (PLAVIX) 75 MG tablet Take 75 mg by mouth daily.   Yes Historical Provider, MD  hydrochlorothiazide 25 MG tablet Take 25 mg by mouth daily.     Yes Historical Provider, MD  nebivolol (BYSTOLIC) 10 MG tablet Take 10 mg by mouth daily.   Yes Historical Provider, MD  nitroGLYCERIN (NITROSTAT) 0.4 MG SL tablet Place 0.4 mg under the tongue every 5 (five) minutes as needed.     Yes Historical Provider, MD  rosuvastatin (CRESTOR) 10 MG tablet Take 10 mg by mouth at bedtime.   Yes Historical Provider, MD  Timolol Maleate (ISTALOL) 0.5 % (DAILY) SOLN Place 1 drop into both eyes daily.   Yes Historical Provider, MD  triamcinolone cream (KENALOG) 0.1 % Apply 1 application topically as needed.   Yes Historical Provider, MD    Scheduled Meds:   . antiseptic oral rinse  1 application Mouth Rinse QID  . cefTRIAXone (ROCEPHIN)  IV  1 g Intravenous Q24H  . chlorhexidine  15 mL Mouth/Throat BID  . enoxaparin (LOVENOX) injection  40 mg Subcutaneous Q24H  . pantoprazole (PROTONIX) IV  40 mg Intravenous QHS  . phenytoin (DILANTIN) IV  100 mg Intravenous Q8H  . timolol  1 drop Both Eyes Daily   Continuous Infusions:   . sodium chloride 20 mL/hr at 03/07/12 1100   PRN Meds:.labetalol, triamcinolone cream  Allergies as of 03/04/2012 - Review Complete 03/04/2012  Allergen Reaction Noted  . Lipitor (atorvastatin calcium)  08/05/2010    Family History  Problem Relation Age of Onset  . Heart failure Mother   . Heart failure Father   . Heart disease Brother     History   Social History  . Marital Status: Widowed    Spouse Name: N/A    Number of Children: 5  . Years of Education: N/A   Occupational History  . business office    Social  History Main Topics  . Smoking status: Never Smoker   . Smokeless tobacco: Not on file  . Alcohol Use: Yes  . Drug Use: No  . Sexually Active:    Other Topics Concern  . Not on file   Social History Narrative  . No narrative on file    Review of Systems: All negative except as stated above in HPI.  Physical Exam: Vital signs: Filed Vitals:   03/08/12 0550  BP: 139/48  Pulse: 71  Temp: 98.3 F (36.8 C)  Resp: 18     General:   Elderly, awake, no acute distress, thin, no acute distress Lungs:  Clear throughout to auscultation.   No wheezes, crackles, or rhonchi. No acute distress. Heart:  Regular rate and rhythm Abdomen: soft, nontender, nondistended, +BS  Rectal:  Deferred  GI:  Lab Results:  Basename 03/08/12 0440 03/07/12 0425 03/06/12 0410  WBC 4.5 5.5 9.7  HGB 9.5* 9.4* 10.3*  HCT 27.3* 26.9* 30.1*  PLT 104* 90* 81*   BMET  Basename 03/08/12 0440 03/07/12 0425 03/06/12 0410  NA 138 136 135  K 3.2* 3.0* 3.8  CL 104 105 103  CO2 23 21 19   GLUCOSE 91 86 110*  BUN 18 16 18   CREATININE 0.84 0.90 0.91  CALCIUM 8.6 8.3* 8.5   LFT No results found for this basename: PROT,ALBUMIN,AST,ALT,ALKPHOS,BILITOT,BILIDIR,IBILI in the last 72 hours PT/INR No results found for this basename: LABPROT:2,INR:2 in the last 72 hours   Studies/Results: No results found.  Impression/Plan: 78yo with Strep Group D (likely Strep bovis) in urine culture (no known bacteremia but pt on Ceftriaxone for Strep) and Strep bovis bacteremia (which we don't know whether she had that or not but reports of colon cancer have occurred with urine infection as well) has increased risk of associated colon cancer. Patient currently is NPO due to swallowing difficulties and cannot undergo an oral lavage for a colonoscopy. If her swallowing improves to allow an oral lavage, then please call us back to decide on the timing of doing a colonoscopy to look for colon cancer. If her swallowing and mental  status will improve then can consider doing a colonoscopy as an outpt, however her co-morbidities may prevent her from being a surgical candidate if a colon cancer is found. D/W plan with Dr. Isidoro Donning and daughter. Another option would be to do an Abd/Pelvis CT with IV contrast to look for any large colonic mass lesions if her swallowing status is no better. Will sign off. Call us back if needed. Dr. Dulce Sellar available to see this weekend if needed.    LOS: 4 days   Theresa Dohrman C.  03/08/2012, 10:36 AM

## 2012-03-09 DIAGNOSIS — N390 Urinary tract infection, site not specified: Secondary | ICD-10-CM

## 2012-03-09 LAB — MAGNESIUM: Magnesium: 1.2 mg/dL — ABNORMAL LOW (ref 1.5–2.5)

## 2012-03-09 MED ORDER — HYDROCHLOROTHIAZIDE 25 MG PO TABS: 25.0000 mg | ORAL_TABLET | Freq: Every day | ORAL | Status: DC

## 2012-03-09 MED ORDER — MAGNESIUM OXIDE 400 (241.3 MG) MG PO TABS
400.0000 mg | ORAL_TABLET | Freq: Two times a day (BID) | ORAL | Status: AC
  Administered 2012-03-09 – 2012-03-10 (×3): 400 mg via ORAL
  Filled 2012-03-09 (×3): qty 1

## 2012-03-09 MED ORDER — PHENYTOIN SODIUM EXTENDED 100 MG PO CAPS
100.0000 mg | ORAL_CAPSULE | Freq: Three times a day (TID) | ORAL | Status: DC
  Administered 2012-03-09 – 2012-03-11 (×6): 100 mg via ORAL
  Filled 2012-03-09 (×8): qty 1

## 2012-03-09 MED ORDER — NEBIVOLOL HCL 10 MG PO TABS
10.0000 mg | ORAL_TABLET | Freq: Every day | ORAL | Status: DC
  Administered 2012-03-09 – 2012-03-11 (×3): 10 mg via ORAL
  Filled 2012-03-09 (×3): qty 1

## 2012-03-09 MED ORDER — NYSTATIN 100000 UNIT/ML MT SUSP
5.0000 mL | Freq: Four times a day (QID) | OROMUCOSAL | Status: DC
  Administered 2012-03-09 – 2012-03-11 (×8): 500000 [IU] via ORAL
  Filled 2012-03-09 (×11): qty 5

## 2012-03-09 MED ORDER — K PHOS MONO-SOD PHOS DI & MONO 155-852-130 MG PO TABS
500.0000 mg | ORAL_TABLET | Freq: Three times a day (TID) | ORAL | Status: DC
  Administered 2012-03-09 – 2012-03-10 (×4): 500 mg via ORAL
  Filled 2012-03-09 (×5): qty 2

## 2012-03-09 MED ORDER — POTASSIUM PHOSPHATE MONOBASIC 500 MG PO TABS
500.0000 mg | ORAL_TABLET | Freq: Three times a day (TID) | ORAL | Status: DC
  Filled 2012-03-09 (×3): qty 1

## 2012-03-09 NOTE — Progress Notes (Signed)
Stroke Team Progress Note  HISTORY Anne Hanna is a 78 y.o. female who was speaking on the phone earlier on 03/04/2012 and started speaking gibberish. Her daughter was called as was EMS and on arrival she was seen to have a seizure. She remained altered in the ER and was intubated, but has since had improvement. She currently is moving all 4 ext, but not following commands. A CT shows a small mesial temporal hemorrhage. She takes ASA but no other thinners.   SUBJECTIVE    OBJECTIVE Most recent Vital Signs: Filed Vitals:   03/08/12 2200 03/09/12 0200 03/09/12 0500 03/09/12 0600  BP: 157/50 132/83  156/56  Pulse: 74 64  74  Temp: 98.4 F (36.9 C) 97.5 F (36.4 C)  98.4 F (36.9 C)  TempSrc:      Resp: 18 18  18   Height:      Weight:   63.7 kg (140 lb 6.9 oz)   SpO2: 96% 97%  97%   CBG (last 3)   Basename 03/07/12 2141  GLUCAP 90   IV Fluid Intake:      . sodium chloride 20 mL/hr at 03/07/12 1100   MEDICATIONS     . antiseptic oral rinse  1 application Mouth Rinse QID  . cefTRIAXone (ROCEPHIN)  IV  1 g Intravenous Q24H  . chlorhexidine  15 mL Mouth/Throat BID  . enoxaparin (LOVENOX) injection  40 mg Subcutaneous Q24H  . pantoprazole (PROTONIX) IV  40 mg Intravenous QHS  . phenytoin (DILANTIN) IV  100 mg Intravenous Q8H  . timolol  1 drop Both Eyes Daily   PRN:  labetalol, triamcinolone cream  Diet:  Dysphagia  Activity:  Bedrest DVT Prophylaxis:  lovenox  CLINICALLY SIGNIFICANT STUDIES Basic Metabolic Panel:   Lab 03/08/12 0440 03/07/12 0425 03/06/12 0410  NA 138 136 --  K 3.2* 3.0* --  CL 104 105 --  CO2 23 21 --  GLUCOSE 91 86 --  BUN 18 16 --  CREATININE 0.84 0.90 --  CALCIUM 8.6 8.3* --  MG 1.3* -- 2.0  PHOS 1.5* -- 3.3   Liver Function Tests:   Lab 03/04/12 1904  AST 36  ALT 16  ALKPHOS 64  BILITOT 0.3  PROT 6.8  ALBUMIN 3.6   CBC:   Lab 03/08/12 0440 03/07/12 0425 03/04/12 1904  WBC 4.5 5.5 --  NEUTROABS -- 3.9 4.6  HGB 9.5* 9.4*  --  HCT 27.3* 26.9* --  MCV 86.1 87.1 --  PLT 104* 90* --   Coagulation:   Lab 03/04/12 1904  LABPROT 15.3*  INR 1.23   Cardiac Enzymes:   Lab 03/04/12 1904  CKTOTAL --  CKMB --  CKMBINDEX --  TROPONINI <0.30   Urinalysis:   Lab 03/04/12 1909  COLORURINE YELLOW  LABSPEC 1.013  PHURINE 5.5  GLUCOSEU NEGATIVE  HGBUR MODERATE*  BILIRUBINUR NEGATIVE  KETONESUR NEGATIVE  PROTEINUR 100*  UROBILINOGEN 1.0  NITRITE NEGATIVE  LEUKOCYTESUR TRACE*   Lipid Panel No results found for this basename: chol,  trig,  hdl,  cholhdl,  vldl,  ldlcalc   HgbA1C  Lab Results  Component Value Date   HGBA1C 5.7* 03/05/2012    Urine Drug Screen:     Component Value Date/Time   LABOPIA NONE DETECTED 03/04/2012 1909   COCAINSCRNUR NONE DETECTED 03/04/2012 1909   LABBENZ NONE DETECTED 03/04/2012 1909   AMPHETMU NONE DETECTED 03/04/2012 1909   THCU NONE DETECTED 03/04/2012 1909   LABBARB NONE DETECTED 03/04/2012 1909  Dilantin 13.8  Alcohol Level: No results found for this basename: ETH:2 in the last 168 hours  CT Head 03/04/2012   11 x 8 mm focus of hemorrhage in the left hippocampus.  Atrophy with extensive small vessel ischemic changes and intracranial atherosclerosis.  Very mild progression of ventriculomegaly since 2010.     MRI of the brain  03/05/2012  1.2 x 1 x 1 cm hematoma medial left temporal lobe (uncus) with mild surrounding vasogenic edema.  Breakthrough of hemorrhage into the left temporal horn with dependent blood noted.  Remote infarcts, small vessel disease type changes and atrophy  MRA of the brain  03/05/2012  No abnormal vessels seen extending to the left temporal lobe (uncus) hematoma).  Intracranial atherosclerotic type changes most notable involving the basilar artery  2D Echocardiogram no cardiac source of embolism. Normal ejection fraction.  Carotid Doppler  Right: 40-59% internal carotid artery stenosis. Left: No evidence of hemodynamically significant  internal carotid artery stenosis. Bilateral: Vertebral artery flow is antegrade.   CXR  03/06/2012  03/04/2012 1.  Endotracheal tube tip just below the level of clavicles. 2.  Lower lung volumes, otherwise no acute cardiopulmonary abnormality identified.    EKG  normal EKG, normal sinus rhythm, unchanged from previous tracings, normal sinus rhythm, nonspecific ST and T waves changes.   Therapy Recommendations - pending  Physical Exam   frail elderly Caucasian lady who's awake. . Afebrile. Head is nontraumatic. Hearing is diminished bilaterally.Neck is supple without bruit. . Cardiac exam no murmur or gallop. Lungs are clear to auscultation. Distal pulses are well felt. Tongue swelling has significantly diminished. Neurological Exam : Awake alert normal speech and language. Diminished recall 0/3 but hearing loss limits cognitive testing. Extraocular movements are full range without nystagmus. Visual acuity and fields appear normal. Face is symmetric without weakness. Tongue is midline. Tongue swelling has come down today. Motor system exam reveals no upper or lower eczema to drift and symmetric and equal strength in all 4 extremities. No focal weakness. Deep tendon reflexes symmetric. Plantars are downgoing. Gait was not tested.    BP 156/56  Pulse 74  Temp 98.4 F (36.9 C) (Oral)  Resp 18  Ht 5\' 4"  (1.626 m)  Wt 63.7 kg (140 lb 6.9 oz)  BMI 24.11 kg/m2  SpO2 97%  ASSESSMENT [name] is a 78 y.o. female presenting with gibberish speech and seizure.  Imaging confirms a small mesial temporal hemorrhage. Etiology likely hemorrhagic infarct from intracranial atherosclerotic disease.  Work up underway. On clopidogrel 75 mg orally every day prior to admission. Now on no anticoagulation. Patient with resultant hemorrhagic infarct complicated by seizure activity.   Hypertension  Hyperlipidemia, on crestor PTA, restart when patient able to take oral medications  Hx CVA / TIA    Hypokalemia  Seizure activity post bleed, EEG read as normal. Patient remains on dilantin.  Anemia, GI evaluating patient.  Hospital day # 5  TREATMENT/PLAN.  Failed speech, remains NPO  Risk factor modification  Replete potassium per primary team  UTI, treated with abx.  Start aspirin 81 mg QD after one week. Consider repeat CT scan prior to restarting aspirin.  Continue Dilantin as seizure prophylaxis.  Stroke Team will sign off. Follow up with Dr. Pearlean Brownie, [facility] in 2 months. Call [phone] for appt.   No driving heavy machinery or automobiles, no climbing heights or staying in standing water for 6-12 months or until cleared by neurologist/primary care MD  03/09/2012 8:02 AM Delton See  PA-C [facility] Pager [phone] 03/09/2012, 8:03 AM

## 2012-03-09 NOTE — Evaluation (Signed)
Physical Therapy Evaluation Patient Details Name: Anne Hanna MRN: [id number] DOB: 1923/03/09 Today's Date: 03/09/2012 Time: 8295-6213 PT Time Calculation (min): 43 min  PT Assessment / Plan / Recommendation Clinical Impression  Pt admitted with acute encephalopathy / seizure and found to have a small mesial temporal hemorrhage as well as a remote partially hemorrhagic left occipital lobe infarct with encephalomalacia and remote strokes of the centrum semiovale bilaterally, basal ganglia bilaterally and the left thalamus. Pt currently presenting with decreased LLE strength deficits as well as minimal balance deficits. Spoke with daughter and pt regarding safety at home and need for 24hr supervision upon initial dc. Pt will benefit from skilled PT in the acute care setting in order to address above deficits for a safe d/c home    PT Assessment  Patient needs continued PT services    Follow Up Recommendations  Home health PT;Supervision/Assistance - 24 hour    Does the patient have the potential to tolerate intense rehabilitation      Barriers to Discharge        Equipment Recommendations  Other (comment) (TBD)    Recommendations for Other Services     Frequency Min 4X/week    Precautions / Restrictions Precautions Precautions: Fall Restrictions Weight Bearing Restrictions: No   Pertinent Vitals/Pain No pain      Mobility  Bed Mobility Bed Mobility: Supine to Sit;Sitting - Scoot to Edge of Bed Supine to Sit: 5: Supervision;With rails Sitting - Scoot to Edge of Bed: 5: Supervision Details for Bed Mobility Assistance: VC for sequencing Transfers Transfers: Sit to Stand;Stand to Sit Sit to Stand: 4: Min assist;With upper extremity assist;From bed Stand to Sit: 4: Min assist;With upper extremity assist;With armrests;To chair/3-in-1 Details for Transfer Assistance: VCs for hand placement Ambulation/Gait Ambulation/Gait Assistance: 4: Min guard;4: Min assist Ambulation  Distance (Feet): 100 Feet Assistive device: 1 person hand held assist Ambulation/Gait Assistance Details: Minguard/Min assist at times for stabiltiy secondary to minimal balance loss. See balance section for more details Gait Pattern: Step-to pattern;Narrow base of support;Trunk flexed Gait velocity: slow Modified Rankin (Stroke Patients Only) Pre-Morbid Rankin Score: No symptoms Modified Rankin: Moderately severe disability    Shoulder Instructions     Exercises     PT Diagnosis: Abnormality of gait  PT Problem List: Decreased strength;Decreased activity tolerance;Decreased balance;Decreased mobility;Decreased knowledge of use of DME;Decreased safety awareness;Decreased knowledge of precautions PT Treatment Interventions: DME instruction;Gait training;Stair training;Functional mobility training;Therapeutic activities;Neuromuscular re-education;Cognitive remediation;Balance training;Patient/family education   PT Goals Acute Rehab PT Goals PT Goal Formulation: With patient Time For Goal Achievement: 03/16/12 Potential to Achieve Goals: Good Pt will go Sit to Stand: with modified independence PT Goal: Sit to Stand - Progress: Goal set today Pt will go Stand to Sit: with modified independence PT Goal: Stand to Sit - Progress: Goal set today Pt will Transfer Bed to Chair/Chair to Bed: with supervision PT Transfer Goal: Bed to Chair/Chair to Bed - Progress: Goal set today Pt will Ambulate: >150 feet;with supervision;with least restrictive assistive device PT Goal: Ambulate - Progress: Goal set today Pt will Go Up / Down Stairs: 3-5 stairs;with min assist PT Goal: Up/Down Stairs - Progress: Goal set today Additional Goals Additional Goal #1: Pt will score <19 on the DGI indicating a decreased fall risk  Visit Information  Last PT Received On: 03/09/12 Assistance Needed: +1 PT/OT Co-Evaluation/Treatment: Yes    Subjective Data  Patient Stated Goal: no goal stated   Prior  Functioning  Home Living Lives With: Alone Available  Help at Discharge: Family;Available 24 hours/day Type of Home: House Home Access: Stairs to enter [employer] of Steps: 3 Entrance Stairs-Rails: None Home Layout: One level Bathroom Shower/Tub: Walk-in shower;Door Charity fundraiser with back Prior Function Level of Independence: Independent Able to Take Stairs?: Yes Driving: Yes Vocation: Retired Musician: No difficulties Dominant Hand: Right    Cognition  Overall Cognitive Status: Appears within functional limits for tasks assessed/performed Arousal/Alertness: Awake/alert Orientation Level: Appears intact for tasks assessed Behavior During Session: [facility] for tasks performed Cognition - Other Comments: Able to find her way back to her room once shown where the sign was that told her the room numbers and the direction in which they are found    Extremity/Trunk Assessment Right Upper Extremity Assessment RUE ROM/Strength/Tone: Within functional levels Left Upper Extremity Assessment LUE ROM/Strength/Tone: Within functional levels Right Lower Extremity Assessment RLE ROM/Strength/Tone: Within functional levels RLE Sensation: WFL - Light Touch Left Lower Extremity Assessment LLE ROM/Strength/Tone: Deficits LLE ROM/Strength/Tone Deficits: grossly 4/5 LLE Sensation: WFL - Light Touch   Balance Balance Balance Assessed: Yes Standardized Balance Assessment Standardized Balance Assessment: Dynamic Gait Index Dynamic Gait Index Level Surface: Moderate Impairment Change in Gait Speed: Mild Impairment Gait and Pivot Turn: Mild Impairment Step Over Obstacle: Mild Impairment Step Around Obstacles: Mild Impairment  End of Session PT - End of Session Equipment Utilized During Treatment: Gait belt Activity Tolerance: Patient tolerated treatment well Patient left: in chair;with call bell/phone within  reach;with family/visitor present Nurse Communication: Mobility status  GP     Milana Kidney 03/09/2012, 4:42 PM  03/09/2012 Milana Kidney DPT PAGER: [phone] OFFICE: [phone]

## 2012-03-09 NOTE — Progress Notes (Signed)
Speech Language Pathology Dysphagia Treatment Patient Details Name: Anne Hanna MRN: [id number] DOB: 29-Mar-1922 Today's Date: 03/09/2012 Time: 1010-1025 SLP Time Calculation (min): 15 min  Assessment / Plan / Recommendation Clinical Impression  F/u after yesterday's MBS.  Reviewed results and precautions with patient, who demonstrates better understanding of mechanisms impacting decreased swallow function.  Observed pt consuming POs; provided min verbal cues to follow established precautions; encouraged her to not stifle cough but utilize it to assist with airway protection.  Pt able to follow-through with techniques and verbalize precautions/rationale with min assist.      Diet Recommendation  Continue with Current Diet: Dysphagia 3 (mechanical soft);Thin liquid    SLP Plan Continue with current plan of care   Pertinent Vitals/Pain No pain   Swallowing Goals  SLP Swallowing Goals Swallow Study Goal #2 - Progress: Progressing toward goal  General Temperature Spikes Noted: No Respiratory Status: Room air Behavior/Cognition: Alert;Cooperative;Pleasant mood;Hard of hearing Patient Positioning: Upright in bed  Oral Cavity - Oral Hygiene     Dysphagia Treatment Treatment focused on: Skilled observation of diet tolerance;Utilization of compensatory strategies Treatment Methods/Modalities: Skilled observation Patient observed directly with PO's: Yes Type of PO's observed: Dysphagia 1 (puree);Thin liquids Feeding: Able to feed self Liquids provided via: Cup;Teaspoon Pharyngeal Phase Signs & Symptoms: Multiple swallows;Immediate throat clear Type of cueing: Verbal Amount of cueing: Minimal   GO    Seren Chaloux L. Samson Frederic, [place] CCC/SLP Pager [phone]  Blenda Mounts Laurice 03/09/2012, 10:30 AM

## 2012-03-09 NOTE — Evaluation (Signed)
Occupational Therapy Evaluation Patient Details Name: QUINTINA HAKEEM MRN: [id number] DOB: 1922-11-11 Today's Date: 03/09/2012 Time: 4098-1191 OT Time Calculation (min): 44 min  OT Assessment / Plan / Recommendation Clinical Impression  This 78 yo female admitted with acute encephalopathy / seizure and found to have a small mesial temporal hemorrhage as well as a remote partially hemorrhagic left occipital lobe infarct with encephalomalacia and remote strokes of the centrum semiovale bilaterally, basal ganglia bilaterally and the left thalamus presents to acute OT with deficits below. Will benefit from acute OT without need for followup.    OT Assessment  Patient needs continued OT Services    Follow Up Recommendations  No OT follow up    Barriers to Discharge None    Equipment Recommendations  None recommended by OT       Frequency  Min 3X/week    Precautions / Restrictions Precautions Precautions: Fall Restrictions Weight Bearing Restrictions: No       ADL  Eating/Feeding: Simulated;Supervision/safety Where Assessed - Eating/Feeding: Edge of bed Grooming: Performed;Brushing hair;Set up;Supervision/safety Where Assessed - Grooming: Unsupported sitting Upper Body Bathing: Simulated;Supervision/safety;Set up Where Assessed - Upper Body Bathing: Supported sitting Lower Body Bathing: Simulated;Minimal assistance Where Assessed - Lower Body Bathing: Supported sit to stand Upper Body Dressing: Simulated;Set up;Supervision/safety Where Assessed - Upper Body Dressing: Supported sitting Lower Body Dressing: Simulated;Minimal assistance Where Assessed - Lower Body Dressing: Supported sit to stand Toilet Transfer: Mining engineer Method: Sit to Barista:  (Bed, out into hallway, back to recliner) Toileting - Architect and Hygiene: Simulated;Min guard Where Assessed - Glass blower/designer Manipulation and Hygiene:  Standing Equipment Used: Gait belt Transfers/Ambulation Related to ADLs: Min A    OT Diagnosis: Disturbance of vision;Generalized weakness  OT Problem List: Impaired balance (sitting and/or standing);Impaired vision/perception OT Treatment Interventions: Self-care/ADL training;DME and/or AE instruction;Balance training;Patient/family education;Visual/perceptual remediation/compensation   OT Goals Acute Rehab OT Goals OT Goal Formulation: With patient Time For Goal Achievement: 03/16/12 Potential to Achieve Goals: Good Miscellaneous OT Goals Miscellaneous OT Goal #1: Pt will be able to locate and get items in her room that she would need for B/D with Supervision. OT Goal: Miscellaneous Goal #1 - Progress: Goal set today Miscellaneous OT Goal #2: Pt will be able to find rooms on the unit without cues after given the number. OT Goal: Miscellaneous Goal #2 - Progress: Goal set today  Visit Information  Last OT Received On: 03/09/12 Assistance Needed: +1 PT/OT Co-Evaluation/Treatment: Yes    Subjective Data  Subjective: I'm not suppose to get up, but I do.   Prior Functioning     Home Living Lives With: Alone Available Help at Discharge: Family;Available 24 hours/day Type of Home: House Home Access: Stairs to enter [employer] of Steps: 3 Entrance Stairs-Rails: None Home Layout: One level Bathroom Shower/Tub: Walk-in shower;Door Charity fundraiser with back Prior Function Level of Independence: Independent Able to Take Stairs?: Yes Driving: Yes Vocation: Retired Musician: No difficulties Dominant Hand: Right         Vision/Perception Vision - Assessment Eye Alignment: Within Functional Limits Vision Assessment: Vision tested Ocular Range of Motion: Within Functional Limits Tracking/Visual Pursuits: Able to track stimulus in all quads without difficulty Visual Fields: Right visual field  deficit;Left visual field deficit (right--old (3/4); mild left (about 1/5)-?new v. not picked u)   Cognition  Overall Cognitive Status: Appears within functional limits for tasks assessed/performed Arousal/Alertness: Awake/alert Orientation Level: Appears intact for  tasks assessed Behavior During Session: [facility] for tasks performed Cognition - Other Comments: Able to find her way back to her room once shown where the sign was that told her the room numbers and the direction in which they are found    Extremity/Trunk Assessment Right Upper Extremity Assessment RUE ROM/Strength/Tone: Within functional levels Left Upper Extremity Assessment LUE ROM/Strength/Tone: Within functional levels     Mobility Bed Mobility Bed Mobility: Supine to Sit;Sitting - Scoot to Edge of Bed Supine to Sit: 5: Supervision;With rails Sitting - Scoot to Edge of Bed: 5: Supervision Transfers Transfers: Sit to Stand;Stand to Sit Sit to Stand: 4: Min assist;With upper extremity assist;From bed Stand to Sit: 4: Min assist;With upper extremity assist;With armrests;To chair/3-in-1 Details for Transfer Assistance: VCs for hand placement              End of Session OT - End of Session Equipment Utilized During Treatment: Gait belt Activity Tolerance: Patient tolerated treatment well Patient left: in chair;with call bell/phone within reach;with family/visitor present (daughter Olegario Messier)) Nurse Communication: Mobility status (nurse saw [place] walking in hall with patient (+1))       Evette Georges [phone] 03/09/2012, 4:05 PM

## 2012-03-09 NOTE — Plan of Care (Signed)
Problem: Phase II Progression Outcomes Goal: Progress activity as tolerated unless otherwise ordered Outcome: Progressing Up to BR with one assist

## 2012-03-09 NOTE — Progress Notes (Signed)
[facility] PROGRESS NOTE  Anne Hanna NWG:[id number] DOB: [birth year] DOA: 03/04/2012 PCP: [name], MD  Assessment/Plan: 1. Small mesial temporal hemorrhage--Etiology likely hemorrhagic infarct from intracranial atherosclerotic disease. Start ASA 81mg  daily after one week. Consider repeat CT head prior to starting ASA. Follow up with Dr. Pearlean Brownie, Stroke [facility] in 2 months. PT/OT pending. 2. Seizure--no further seizures. Continue Dilantin per neurology. 3. Hypokalemia, Hypomagnesemia, hypophosphotemia--recheck today.  4. S/p acute respiratory failure--intubated for airwary protection.  5. Acute renal failure--resolved. 6. UTI, likely strep bovis--d/c Rocephin after today. Discussed with Dr. Dulce Sellar, given stroke recommends recovery and outpatient follow-up rather than inpatient workup. 7. Anemia-chronic, seen 06/2008, stable currently. Follow-up as outpatient.  8. Thrombocytopenia--chronic, seen 06/2008, stable currently. Follow-up as outpatient.   Code Status: full code Family Communication: discussed with daughter/POA Bonita Quin at bedside Disposition Plan: pending therapy evaluations. Lived by self prior to admission.  [name], MD  [facility] Team 6 Pager [phone] If 8PM-8AM, please contact night-coverage at [email or web address], password [facility] 03/09/2012, 8:44 AM  LOS: 5 days   Consultants:  Admitted by [facility]  Neurology/Stroke Team  [facility]  ST--dysphagia 3 diet, thin liquid, meds crushed with puree.  Procedures:  EEG--"This is a normal EEG. Wakefulness could not be fully evaluated due to artifact."  2d echo--Left ventricle: Possible mild hypokinesis of the base-inferior wall. The cavity size was normal. Wall thickness was normal. The estimated ejection fraction was 60%  12/19 Findings consistent with 40-59%stenosis involving the right internal carotid artery. - No evidence of stenosis involving the left internal carotid artery. - Bilateral vertebral  artery flow antegrade.  Antibiotics:  Rocephin 12/18 >>  HPI/Subjective: Eating without problem. No weakness, numbness or tingling. No new issues.  Objective: Filed Vitals:   03/08/12 2200 03/09/12 0200 03/09/12 0500 03/09/12 0600  BP: 157/50 132/83  156/56  Pulse: 74 64  74  Temp: 98.4 F (36.9 C) 97.5 F (36.4 C)  98.4 F (36.9 C)  TempSrc:      Resp: 18 18  18   Height:      Weight:   63.7 kg (140 lb 6.9 oz)   SpO2: 96% 97%  97%   No intake or output data in the 24 hours ending 03/09/12 0844 Filed Weights   03/07/12 2129 03/08/12 0500 03/09/12 0500  Weight: 66.6 kg (146 lb 13.2 oz) 67.2 kg (148 lb 2.4 oz) 63.7 kg (140 lb 6.9 oz)    Exam:  General:  Appears calm and comfortable Eyes: PERRL, normal lids, irises ENT: grossly normal hearing, lips  Cardiovascular: RRR, no m/r/g. No LE edema. Telemetry: SR, no arrhythmias  Respiratory: CTA bilaterally, no w/r/r. Normal respiratory effort. Skin: no rash or induration seen on limited exam Musculoskeletal: grossly normal tone BUE/BLE, normal strength BUE/BLE Psychiatric: grossly normal mood and affect, speech fluent and appropriate Neurologic: CN II-XII intact  Data Reviewed: Basic Metabolic Panel:  Lab 03/08/12 2952 03/07/12 0425 03/06/12 0410 03/05/12 0605 03/04/12 1904  NA 138 136 135 136 131*  K 3.2* 3.0* 3.8 3.5 3.8  CL 104 105 103 101 91*  CO2 23 21 19 24  16*  GLUCOSE 91 86 110* 124* 122*  BUN 18 16 18 23  29*  CREATININE 0.84 0.90 0.91 1.11* 1.29*  CALCIUM 8.6 8.3* 8.5 8.8 9.7  MG 1.3* -- 2.0 1.6 --  PHOS 1.5* -- 3.3 3.2 --   Liver Function Tests:  Lab 03/04/12 1904  AST 36  ALT 16  ALKPHOS 64  BILITOT 0.3  PROT 6.8  ALBUMIN  3.6   CBC:  Lab 03/08/12 0440 03/07/12 0425 03/06/12 0410 03/05/12 0605 03/04/12 1904  WBC 4.5 5.5 9.7 6.2 6.7  NEUTROABS -- 3.9 -- -- 4.6  HGB 9.5* 9.4* 10.3* 9.6* 10.7*  HCT 27.3* 26.9* 30.1* 27.6* 31.0*  MCV 86.1 87.1 87.5 87.1 86.6  PLT 104* 90* 81* 104* 137*    Cardiac Enzymes:  Lab 03/04/12 1904  CKTOTAL --  CKMB --  CKMBINDEX --  TROPONINI <0.30   CBG:  Lab 03/07/12 2141 03/04/12 1844  GLUCAP 90 119*    Recent Results (from the past 240 hour(s))  URINE CULTURE     Status: Normal   Collection Time   03/04/12  7:09 PM      Component Value Range Status Comment   Specimen Description URINE, RANDOM   Final    Special Requests NONE   Final    Culture  Setup Time 03/04/2012 20:19   Final    Colony Count >=100,000 COLONIES/ML   Final    Culture STREPTOCOCCUS GROUP D;high probability for S.bovis   Final    Report Status 03/07/2012 FINAL   Final    Organism ID, Bacteria STREPTOCOCCUS GROUP D;high probability for S.bovis   Final   MRSA PCR SCREENING     Status: Normal   Collection Time   03/04/12 11:46 PM      Component Value Range Status Comment   MRSA by PCR NEGATIVE  NEGATIVE Final      Studies: Dg Swallowing Func-speech Pathology  03/08/2012  Anne Hanna, CCC-SLP     03/08/2012  1:24 PM Objective Swallowing Evaluation: Modified Barium Swallowing Study   Patient Details  Name: Anne Hanna MRN: [id number] Date of Birth: April 08, 1922  Today's Date: 03/08/2012 Time: 1115-1130 SLP Time Calculation (min): 15 min  Past Medical History:  Past Medical History  Diagnosis Date  . Coronary artery disease     STATUS POST CABG  . Hypertension   . Hypercholesterolemia   . CVA (cerebral vascular accident)     OCULAR CVA  . Hx: UTI (urinary tract infection)   . Glaucoma(365)     History of glaucoma  . Bronchopneumonia     Right lung  . Urinary tract infection     Escherichia coli urinary tract infection  . Acute sinusitis   . Thrombocytopenia 2010     platelet count of 96 at discharge (95 on admission)  . Hyperlipidemia   . History of transient ischemic attack   . Hyponatremia     volume depletion - resolved  . Volume overload     History of postoperative volume overload  . Delirium     Postoperative delirium  . Cataract    Past Surgical History:   Past Surgical History  Procedure Date  . Cardiac catheterization 10/05/2006    NORMAL LEFT VENTRICULAR SIZE. THERE IS MODERATE GLOBAL  HYPOKINESIA WITH OVERALL EF 40%  . Coronary artery bypass graft 09/2006    LIMA GRAFT TO THE LAD, SAPHENOUS VEIN GRAFT TO THE FIRST OBTUSE  MARGINAL VESSEL WITH SEQUENTIAL GRAFT TO THE SECOND MARGINAL  VESSEL , AND SAPHENOUS VEIN GRAFT TO THE PDA  . Cataract surgery     Bilateral cataract extraction-- x2  . Tendon repair     Status post repair of tendon of the left index finger   HPI:  SIMON AABERG is a 78 y.o. female who was speaking on the phone  earlier on 03/04/2012 and started speaking gibberish. Her  daughter was called  as was EMS and on arrival she was seen to  have a seizure. She remained altered in the ER and was intubated,  but has since had improvement. She currently is moving all 4 ext,  but not following commands. A CT shows a small left mesial  temporal hemorrhage. She takes ASA but no other thinners.       Assessment / Plan / Recommendation Clinical Impression  Dysphagia Diagnosis: Moderate pharyngeal phase dysphagia (with  anatomical component) Clinical impression: Patient presents with what this SLP suspects  is a baseline anatomical component (given c/o some dysphagia  prior to admission) characterized by anterior curvature of  cervical spine perhaps preventing full epiglottic deflection,   exacerbated by an acute moderate pharyngeal dysphagia resulting  from CVA. In addition to anatomical variation, patient with  decreased base of tongue strength, hyo-laryngeal elevation, and  excursion resulting in moderate-severe vallecular residuals post  swallow, which lead to intermittent flash penetration of liquids.  Additionally, the weight of larger bolus size appears to be  successful in reducing residuals however results in a greater  delay in swallow initiation and deeper penetration. Penetrates  however are consistently sensed and patient responds with a  strong and  effective throat clear to clear the airway as well as  spontaneous dry swallows to clear pharynx. Recommend initiation  of a dysphagia 3 (mechanical soft) diet with thin liquids with  strict aspiration precautions. SLP will f/u at bedside for  improved function and reinforcement of strategies.     Treatment Recommendation  Therapy as outlined in treatment plan below    Diet Recommendation Dysphagia 3 (Mechanical Soft);Thin liquid   Liquid Administration via: Cup;Straw Medication Administration: Crushed with puree Supervision: Patient able to self feed;Full supervision/cueing  for compensatory strategies Compensations: Slow rate;Small sips/bites;Multiple dry swallows  after each bite/sip Postural Changes and/or Swallow Maneuvers: Seated upright 90  degrees;Upright 30-60 min after meal    Other  Recommendations Oral Care Recommendations: Oral care BID   Follow Up Recommendations  Other (comment) (TBD)    Frequency and Duration min 3x week  2 weeks       SLP Swallow Goals Patient will utilize recommended strategies during swallow to  increase swallowing safety with: Minimal assistance Swallow Study Goal #2 - Progress: Not met   General HPI: BEVELYN ARRIOLA is a 78 y.o. female who was speaking  on the phone earlier on 03/04/2012 and started speaking  gibberish. Her daughter was called as was EMS and on arrival she  was seen to have a seizure. She remained altered in the ER and  was intubated, but has since had improvement. She currently is  moving all 4 ext, but not following commands. A CT shows a small  left mesial temporal hemorrhage. She takes ASA but no other  thinners.   Type of Study: Modified Barium Swallowing Study Reason for Referral: Objectively evaluate swallowing function Previous Swallow Assessment: F/u swallow assessment at bedside  this am; patient continues to demonstrate s/s of aspiration.  Diet Prior to this Study: NPO Temperature Spikes Noted: No Respiratory Status: Room air History of Recent  Intubation: Yes Length of Intubations (days): 2 days Date extubated: 03/06/12 Behavior/Cognition: Alert;Cooperative;Pleasant mood;Hard of  hearing Oral Cavity - Dentition: Dentures, not available Oral Motor / Sensory Function: Within functional limits Self-Feeding Abilities: Able to feed self Patient Positioning: Upright in chair Baseline Vocal Quality: Clear Volitional Cough: Strong Volitional Swallow: Able to elicit Anatomy: Other (Comment) (anterior curvature of cervical spine) Pharyngeal Secretions:  Not observed secondary MBS    Reason for Referral Objectively evaluate swallowing function   Oral Phase Oral Preparation/Oral Phase Oral Phase: WFL   Pharyngeal Phase Pharyngeal Phase Pharyngeal Phase: Impaired Pharyngeal - Thin Pharyngeal - Thin Cup: Delayed swallow initiation;Premature  spillage to valleculae;Reduced anterior laryngeal  mobility;Reduced laryngeal elevation;Reduced airway/laryngeal  closure;Reduced tongue base retraction;Penetration/Aspiration  during swallow;Pharyngeal residue - valleculae Penetration/Aspiration details (thin cup): Material enters  airway, remains ABOVE vocal cords then ejected out Pharyngeal - Thin Straw: Delayed swallow initiation;Premature  spillage to pyriform sinuses;Reduced anterior laryngeal  mobility;Reduced laryngeal elevation;Reduced airway/laryngeal  closure;Reduced tongue base retraction;Penetration/Aspiration  during swallow;Pharyngeal residue - valleculae Penetration/Aspiration details (thin straw): Material enters  airway, CONTACTS cords then ejected out Pharyngeal - Solids Pharyngeal - Puree: Reduced anterior laryngeal mobility;Reduced  laryngeal elevation;Reduced tongue base retraction;Pharyngeal  residue - valleculae Pharyngeal - Mechanical Soft: Reduced anterior laryngeal  mobility;Reduced laryngeal elevation;Reduced tongue base  retraction;Pharyngeal residue - valleculae  Cervical Esophageal Phase    GO   Ferdinand Lango MA, CCC-SLP [phone]  Cervical Esophageal  Phase Cervical Esophageal Phase: [facility]         Hanna Anne Meryl 03/08/2012, 1:23 PM      Scheduled Meds:   . antiseptic oral rinse  1 application Mouth Rinse QID  . cefTRIAXone (ROCEPHIN)  IV  1 g Intravenous Q24H  . chlorhexidine  15 mL Mouth/Throat BID  . enoxaparin (LOVENOX) injection  40 mg Subcutaneous Q24H  . pantoprazole (PROTONIX) IV  40 mg Intravenous QHS  . phenytoin (DILANTIN) IV  100 mg Intravenous Q8H  . timolol  1 drop Both Eyes Daily   Continuous Infusions:   . sodium chloride 20 mL/hr at 03/07/12 1100    Principal Problem:  *Cerebral brain hemorrhage Active Problems:  Coronary artery disease  Hypertension  Hypercholesterolemia  CVA (cerebral vascular accident)  Unspecified intracranial hemorrhage  Seizure  Acute respiratory failure  Acute encephalopathy  Anemia     [name], MD  [facility] Team 6 Pager [phone] If 8PM-8AM, please contact night-coverage at [email or web address], password [facility] 03/09/2012, 8:44 AM  LOS: 5 days   Time spent: 25 minutes

## 2012-03-09 NOTE — Plan of Care (Signed)
Problem: Phase II Progression Outcomes Goal: Progress activity as tolerated unless otherwise ordered Outcome: Progressing Tolerates up in chair well

## 2012-03-09 NOTE — Progress Notes (Addendum)
MEDICATION RELATED CONSULT NOTE - INITIAL   Pharmacy Consult for phenytoin Indication: seizure  Allergies  Allergen Reactions  . Lipitor (Atorvastatin Calcium)     HEADACHES    Patient Measurements: Height: 5\' 4"  (162.6 cm) Weight: 140 lb 6.9 oz (63.7 kg) IBW/kg (Calculated) : 54.7   Vital Signs: Temp: 98.8 F (37.1 C) (12/21 0945) BP: 137/54 mmHg (12/21 0945) Pulse Rate: 81  (12/21 0945) Intake/Output from previous day:   Intake/Output from this shift: Total I/O In: 240 [P.O.:240] Out: -   Labs:  Basename 03/08/12 0440 03/07/12 0425  WBC 4.5 5.5  HGB 9.5* 9.4*  HCT 27.3* 26.9*  PLT 104* 90*  APTT -- --  CREATININE 0.84 0.90  LABCREA -- --  CREATININE 0.84 0.90  CREAT24HRUR -- --  MG 1.3* --  PHOS 1.5* --  ALBUMIN -- --  PROT -- --  ALBUMIN -- --  AST -- --  ALT -- --  ALKPHOS -- --  BILITOT -- --  BILIDIR -- --  IBILI -- --   Estimated Creatinine Clearance: 40.7 ml/min (by C-G formula based on Cr of 0.84).   Medical History: Past Medical History  Diagnosis Date  . Coronary artery disease     STATUS POST CABG  . Hypertension   . Hypercholesterolemia   . CVA (cerebral vascular accident)     OCULAR CVA  . Hx: UTI (urinary tract infection)   . Glaucoma(365)     History of glaucoma  . Bronchopneumonia     Right lung  . Urinary tract infection     Escherichia coli urinary tract infection  . Acute sinusitis   . Thrombocytopenia 2010     platelet count of 96 at discharge (95 on admission)  . Hyperlipidemia   . History of transient ischemic attack   . Hyponatremia     volume depletion - resolved  . Volume overload     History of postoperative volume overload  . Delirium     Postoperative delirium  . Cataract     Medications:  Prescriptions prior to admission  Medication Sig Dispense Refill  . clopidogrel (PLAVIX) 75 MG tablet Take 75 mg by mouth daily.      . hydrochlorothiazide 25 MG tablet Take 25 mg by mouth daily.        .  nebivolol (BYSTOLIC) 10 MG tablet Take 10 mg by mouth daily.      . nitroGLYCERIN (NITROSTAT) 0.4 MG SL tablet Place 0.4 mg under the tongue every 5 (five) minutes as needed.        . rosuvastatin (CRESTOR) 10 MG tablet Take 10 mg by mouth at bedtime.      . Timolol Maleate (ISTALOL) 0.5 % (DAILY) SOLN Place 1 drop into both eyes daily.      Marland Kitchen triamcinolone cream (KENALOG) 0.1 % Apply 1 application topically as needed.       Scheduled:    . antiseptic oral rinse  1 application Mouth Rinse QID  . cefTRIAXone (ROCEPHIN)  IV  1 g Intravenous Q24H  . chlorhexidine  15 mL Mouth/Throat BID  . enoxaparin (LOVENOX) injection  40 mg Subcutaneous Q24H  . nebivolol  10 mg Oral Daily  . pantoprazole (PROTONIX) IV  40 mg Intravenous QHS  . phenytoin  100 mg Oral TID  . [COMPLETED] potassium chloride  10 mEq Intravenous Q1 Hr x 3  . timolol  1 drop Both Eyes Daily  . [DISCONTINUED] hydrochlorothiazide  25 mg Oral Daily  . [DISCONTINUED]  phenytoin (DILANTIN) IV  100 mg Intravenous Q8H    Assessment: 78 yo female here with seizures and on phenytoin IV (phenytoin level= 13.8 on 12/18 and loaded on 12/16; albumin=3.6 on 12/16). Pharmacy asked to change to po and to monitor.  Goal of Therapy:  Phenytoin level= 10-20  Plan:  -Will change phenytoin to 100mg  po q8h -Will re-check phenytoin level on 12/23 (7 days post load)  Thank you for asking pharmacy to be involved in the care of this patient.  Harland German, Pharm D 03/09/2012 12:14 PM

## 2012-03-10 LAB — BASIC METABOLIC PANEL
BUN: 14 mg/dL (ref 6–23)
Creatinine, Ser: 0.83 mg/dL (ref 0.50–1.10)
GFR calc Af Amer: 71 mL/min — ABNORMAL LOW (ref >90)
GFR calc non Af Amer: 62 mL/min — ABNORMAL LOW (ref >90)
Glucose, Bld: 113 mg/dL — ABNORMAL HIGH (ref 70–99)

## 2012-03-10 MED ORDER — MAGNESIUM OXIDE 400 (241.3 MG) MG PO TABS
400.0000 mg | ORAL_TABLET | Freq: Two times a day (BID) | ORAL | Status: DC
  Administered 2012-03-10 – 2012-03-11 (×2): 400 mg via ORAL
  Filled 2012-03-10 (×3): qty 1

## 2012-03-10 MED ORDER — GUAIFENESIN ER 600 MG PO TB12
600.0000 mg | ORAL_TABLET | Freq: Two times a day (BID) | ORAL | Status: DC | PRN
  Administered 2012-03-10: 600 mg via ORAL
  Filled 2012-03-10 (×2): qty 1

## 2012-03-10 MED ORDER — LORATADINE 10 MG PO TABS
10.0000 mg | ORAL_TABLET | Freq: Every day | ORAL | Status: DC
  Administered 2012-03-10 – 2012-03-11 (×2): 10 mg via ORAL
  Filled 2012-03-10 (×2): qty 1

## 2012-03-10 NOTE — Progress Notes (Signed)
Physical Therapy Treatment Patient Details Name: Anne Hanna MRN: [id number] DOB: 08-22-1922 Today's Date: 03/10/2012 Time: 4098-1191 PT Time Calculation (min): 18 min  PT Assessment / Plan / Recommendation Comments on Treatment Session  Trialed ambulation with use of RW today due to pt still with mild unsteadiness without any AD.  Increased steadiness + safety noted.      Follow Up Recommendations  Home health PT;Supervision/Assistance - 24 hour     Does the patient have the potential to tolerate intense rehabilitation     Barriers to Discharge        Equipment Recommendations  Other (comment) (TBD)    Recommendations for Other Services    Frequency Min 4X/week   Plan Discharge plan remains appropriate    Precautions / Restrictions Precautions Precautions: Fall Restrictions Weight Bearing Restrictions: No       Mobility  Bed Mobility Bed Mobility: Supine to Sit;Sitting - Scoot to Edge of Bed Supine to Sit: 6: Modified independent (Device/Increase time) Sitting - Scoot to Edge of Bed: 6: Modified independent (Device/Increase time) Transfers Transfers: Sit to Stand;Stand to Sit Sit to Stand: 4: Min guard;With upper extremity assist;From bed;Other (comment) (mat table in Neuro Gym) Stand to Sit: 4: Min guard;With upper extremity assist;To chair/3-in-1;Other (comment) (mat table in neuro gym) Details for Transfer Assistance: Cues for safest hand placement & technique.   Ambulation/Gait Ambulation/Gait Assistance: 4: Min guard;4: Min Environmental consultant (Feet): 200 Feet Assistive device: 1 person hand held assist;Rolling walker Ambulation/Gait Assistance Details: Min (A) without RW for steadiness; Min Guard (A) with use of RW.  without RW, pt reaching for environmental surfaces for support as well as episodes of staggering with external distractions.    Cues for RW use, encouragement to increase step/stride length, & increase gait speed.   Gait Pattern:  Step-through pattern;Decreased stride length (decreased floor clearance) Gait velocity: slow General Gait Details: Pt with slow cadence & small step/stride length.   Stairs: No     PT Goals Acute Rehab PT Goals Time For Goal Achievement: 03/16/12 Potential to Achieve Goals: Good Pt will go Sit to Stand: with modified independence PT Goal: Sit to Stand - Progress: Progressing toward goal Pt will go Stand to Sit: with modified independence PT Goal: Stand to Sit - Progress: Progressing toward goal Pt will Transfer Bed to Chair/Chair to Bed: with modified independence Pt will Ambulate: >150 feet;with supervision;with least restrictive assistive device PT Goal: Ambulate - Progress: Progressing toward goal Pt will Go Up / Down Stairs: 3-5 stairs;with min assist Additional Goals Additional Goal #1: Pt will score <19 on the DGI indicating a decreased fall risk  Visit Information  Last PT Received On: 03/10/12 Assistance Needed: +1    Subjective Data  Subjective: Im doing well   Cognition  Overall Cognitive Status: Impaired Area of Impairment: Awareness of deficits Arousal/Alertness: Awake/alert Orientation Level: Appears intact for tasks assessed Behavior During Session: [facility] for tasks performed Awareness of Deficits: Pt reports she doesnt feel as though she has any balance deficits despite requiring (A) for steadiness when ambulating.      Balance     End of Session PT - End of Session Equipment Utilized During Treatment: Gait belt Activity Tolerance: Patient tolerated treatment well Patient left: in chair;with call bell/phone within reach Nurse Communication: Mobility status     Verdell Face, [place] [phone] 03/10/2012

## 2012-03-10 NOTE — Progress Notes (Signed)
[facility] PROGRESS NOTE  TANVI GATLING ZOX:[id number] DOB: 12/27/1922 DOA: 03/04/2012 PCP: [name], MD  Assessment/Plan: 1. Small mesial temporal hemorrhage--Etiology likely hemorrhagic infarct from intracranial atherosclerotic disease. Start ASA 81mg  daily after one week. Consider repeat CT head prior to starting ASA. Follow up with Dr. Pearlean Brownie, [facility]roke [facility] in 2 months. PT/OT noted. 2. Seizure--no further seizures. Continue Dilantin per neurology. 3. Hypokalemia, Hypomagnesemia, hypophosphotemia--replete magnesium. 4. S/p acute respiratory failure--intubated for airwary protection.  5. Acute renal failure--resolved. 6. UTI, likely strep bovis--finished Rocephin. Discussed with Dr. Dulce Sellar, given stroke, recommends recovery and outpatient follow-up rather than inpatient workup. 7. Anemia-chronic, seen 06/2008, stable currently. Follow-up as outpatient.  8. Thrombocytopenia--chronic, seen 06/2008, stable currently. Follow-up as outpatient.   Code Status: full code Family Communication: none today Disposition Plan: anticipate discharge home 12/23.  [name], MD  [facility] Team 6 Pager [phone] If 8PM-8AM, please contact night-coverage at [email or web address], password [facility] 03/10/2012, 5:31 PM  LOS: 6 days   Consultants:  Admitted by [facility]  Neurology/Stroke Team  [facility]  ST--dysphagia 3 diet, thin liquid, meds crushed with puree.  Procedures:  EEG--"This is a normal EEG. Wakefulness could not be fully evaluated due to artifact."  2d echo--Left ventricle: Possible mild hypokinesis of the base-inferior wall. The cavity size was normal. Wall thickness was normal. The estimated ejection fraction was 60%  12/19 Findings consistent with 40-59%stenosis involving the right internal carotid artery. - No evidence of stenosis involving the left internal carotid artery. - Bilateral vertebral artery flow antegrade.  Antibiotics:  Rocephin 12/18  >>  HPI/Subjective: Feels better, no new problems, ready to go home tomorrow.  Objective: Filed Vitals:   03/10/12 0443 03/10/12 0608 03/10/12 1007 03/10/12 1353  BP:  156/62 148/52 122/50  Pulse:  73 72 64  Temp:  98.7 F (37.1 C) 98 F (36.7 C) 97.8 F (36.6 C)  TempSrc:  Oral Oral Oral  Resp:  20 18 18   Height:      Weight: 62.8 kg (138 lb 7.2 oz)     SpO2:  97% 99% 97%    Intake/Output Summary (Last 24 hours) at 03/10/12 1731 Last data filed at 03/10/12 1419  Gross per 24 hour  Intake    310 ml  Output      0 ml  Net    310 ml   Filed Weights   03/08/12 0500 03/09/12 0500 03/10/12 0443  Weight: 67.2 kg (148 lb 2.4 oz) 63.7 kg (140 lb 6.9 oz) 62.8 kg (138 lb 7.2 oz)    Exam:  General:  Appears calm and comfortable Cardiovascular: RRR, no m/r/g. No LE edema. Respiratory: CTA bilaterally, no w/r/r. Normal respiratory effort. Psychiatric: grossly normal mood and affect, speech fluent and appropriate  Data Reviewed: Basic Metabolic Panel:  Lab 03/10/12 8295 03/09/12 0955 03/08/12 0440 03/07/12 0425 03/06/12 0410 03/05/12 0605  NA 140 -- 138 136 135 136  K 3.5 -- 3.2* 3.0* 3.8 3.5  CL 102 -- 104 105 103 101  CO2 25 -- 23 21 19 24   GLUCOSE 113* -- 91 86 110* 124*  BUN 14 -- 18 16 18 23   CREATININE 0.83 -- 0.84 0.90 0.91 1.11*  CALCIUM 8.6 -- 8.6 8.3* 8.5 8.8  MG 1.2* 1.2* 1.3* -- 2.0 1.6  PHOS 3.5 2.2* 1.5* -- 3.3 3.2   Liver Function Tests:  Lab 03/04/12 1904  AST 36  ALT 16  ALKPHOS 64  BILITOT 0.3  PROT 6.8  ALBUMIN 3.6   CBC:  Lab 03/08/12 0440 03/07/12 0425 03/06/12 0410 03/05/12 0605 03/04/12 1904  WBC 4.5 5.5 9.7 6.2 6.7  NEUTROABS -- 3.9 -- -- 4.6  HGB 9.5* 9.4* 10.3* 9.6* 10.7*  HCT 27.3* 26.9* 30.1* 27.6* 31.0*  MCV 86.1 87.1 87.5 87.1 86.6  PLT 104* 90* 81* 104* 137*   Cardiac Enzymes:  Lab 03/04/12 1904  CKTOTAL --  CKMB --  CKMBINDEX --  TROPONINI <0.30   CBG:  Lab 03/07/12 2141 03/04/12 1844  GLUCAP 90 119*    Recent  Results (from the past 240 hour(s))  URINE CULTURE     Status: Normal   Collection Time   03/04/12  7:09 PM      Component Value Range Status Comment   Specimen Description URINE, RANDOM   Final    Special Requests NONE   Final    Culture  Setup Time 03/04/2012 20:19   Final    Colony Count >=100,000 COLONIES/ML   Final    Culture STREPTOCOCCUS GROUP D;high probability for S.bovis   Final    Report Status 03/07/2012 FINAL   Final    Organism ID, Bacteria STREPTOCOCCUS GROUP D;high probability for S.bovis   Final   MRSA PCR SCREENING     Status: Normal   Collection Time   03/04/12 11:46 PM      Component Value Range Status Comment   MRSA by PCR NEGATIVE  NEGATIVE Final      Studies: No results found.  Scheduled Meds:    . antiseptic oral rinse  1 application Mouth Rinse QID  . cefTRIAXone (ROCEPHIN)  IV  1 g Intravenous Q24H  . chlorhexidine  15 mL Mouth/Throat BID  . enoxaparin (LOVENOX) injection  40 mg Subcutaneous Q24H  . nebivolol  10 mg Oral Daily  . nystatin  5 mL Oral QID  . pantoprazole (PROTONIX) IV  40 mg Intravenous QHS  . phenytoin  100 mg Oral TID  . phosphorus  500 mg Oral TID  . timolol  1 drop Both Eyes Daily   Continuous Infusions:    . sodium chloride 20 mL/hr at 03/07/12 1100    Principal Problem:  *Cerebral brain hemorrhage Active Problems:  Hypertension  Hypercholesterolemia  CVA (cerebral vascular accident)  Unspecified intracranial hemorrhage  Seizure  Acute respiratory failure  Acute encephalopathy  Anemia  UTI (lower urinary tract infection)     [name], MD  [facility] Team 6 Pager [phone] If 8PM-8AM, please contact night-coverage at [email or web address], password [facility] 03/10/2012, 5:31 PM  LOS: 6 days   Time spent: 20 minutes

## 2012-03-11 ENCOUNTER — Inpatient Hospital Stay (HOSPITAL_COMMUNITY): Payer: Medicare Other

## 2012-03-11 LAB — ALBUMIN: Albumin: 3.2 g/dL — ABNORMAL LOW (ref 3.5–5.2)

## 2012-03-11 MED ORDER — PHENYTOIN SODIUM EXTENDED 100 MG PO CAPS: 100.0000 mg | ORAL_CAPSULE | Freq: Two times a day (BID) | ORAL | Status: DC

## 2012-03-11 MED ORDER — AMOXICILLIN-POT CLAVULANATE 875-125 MG PO TABS
1.0000 | ORAL_TABLET | Freq: Two times a day (BID) | ORAL | Status: DC
  Administered 2012-03-11: 1 via ORAL
  Filled 2012-03-11 (×3): qty 1

## 2012-03-11 MED ORDER — BENZONATATE 100 MG PO CAPS
100.0000 mg | ORAL_CAPSULE | Freq: Two times a day (BID) | ORAL | Status: DC | PRN
  Administered 2012-03-11: 100 mg via ORAL
  Filled 2012-03-11 (×2): qty 1

## 2012-03-11 MED ORDER — NYSTATIN 100000 UNIT/ML MT SUSP: 5.0000 mL | Freq: Four times a day (QID) | OROMUCOSAL | Status: DC

## 2012-03-11 MED ORDER — PHENYTOIN SODIUM EXTENDED 100 MG PO CAPS
100.0000 mg | ORAL_CAPSULE | Freq: Two times a day (BID) | ORAL | Status: DC
  Filled 2012-03-11: qty 1

## 2012-03-11 MED ORDER — AMOXICILLIN-POT CLAVULANATE 875-125 MG PO TABS: 1.0000 | ORAL_TABLET | Freq: Two times a day (BID) | ORAL | Status: DC

## 2012-03-11 NOTE — Progress Notes (Signed)
Patient continues to have the productive cough more often. Notified Callahan/NP. Received PRN order.

## 2012-03-11 NOTE — Discharge Summary (Signed)
Physician Discharge Summary  COOPER STAMP WUJ:[id number] DOB: 1922-09-04 DOA: 03/04/2012  PCP: [name], MD  Admit date: 03/04/2012 Discharge date: 03/11/2012  Recommendations for Outpatient Follow-up:  1. Followup stroke. Consider starting aspirin early January. 2. Followup seizure disorder. Consider repeat Dilantin level approximately December 30. 3. Followup Streptococcus bovis in urine. Consider outpatient GI evaluation. 4. Home health physical therapy.  Follow-up Information    Follow up with SETHI,PRAMODKUMAR P, MD. In 1 month. (Call office 2 days after discharge for appointment in 1 to 2 months.)    Contact information:   [address], [address] [facility] [place] [address] [phone]       Follow up with SHAW,KIMBERLEE, MD. In 1 week.   Contact information:   [address] [place] [address] [phone]       Follow up with Shirley Friar., MD. Schedule an appointment as soon as possible for a visit in 1 month.   Contact information:   [address] [place]nie [place]rum[place] [address] [phone]         Discharge Diagnoses:  1. Small mesial temporal hemorrhage   2. Seizure 3. S/p acute respiratory failure 4. Acute renal failure 5. UTI, likely strep bovis 6. Anemia 7. Thrombocytopenia  Discharge Condition: Improved Disposition: Home with home health physical therapy, rolling walker  Diet recommendation: Dysphagia 3 diet, thin liquids  Filed Weights   03/09/12 0500 03/10/12 0443 03/11/12 0614  Weight: 63.7 kg (140 lb 6.9 oz) 62.8 kg (138 lb 7.2 oz) 63.8 kg (140 lb 10.5 oz)    History of present illness:  78 y.o. female who was speaking on the phone day of admission and started speaking gibberish. Her daughter called EMS and on arrival she was seen to have a seizure. She remained altered in the ER and was intubated. CT showed a small mesial temporal  hemorrhage.   D/w neuro dr Amada Jupiter. Pt not a surgical candidate. 78 yo F with small intraparenchymal hemorrahge and seizures. Loaded with Fosphenytoin. Goal SBP < 160. Plavix held.  Hospital Course:  Ms. Zobrist was admitted to the critical care unit for acute stroke, seizure and inability to protect airway. She was seen in consultation with neurology. Further evaluation revealed small hemorrhagic infarct as below. Neurology recommended considering starting low-dose aspirin in the near future as below. Could consider repeating CT of head prior to starting. Fortunately patient had no persistent focal deficits. She was evaluated by physical therapy and will have home health. She had no further seizures after Dilantin load. She will continue on Dilantin with a repeat level in approximately one week. Seizure precautions. Urine culture was notable for possible Streptococcus bovis as below. She is now stable for discharge. See below for further details.  1. Small mesial temporal hemorrhage--Etiology likely hemorrhagic infarct from intracranial atherosclerotic disease. Start ASA 81mg  daily after one week. Consider repeat CT head prior to starting ASA. Follow up with Dr. Pearlean Brownie, Stroke Clinic in 2 months. PT/OT noted. Dysphagia 3 (Mechanical Soft);Thin liquid.   2. Seizure--no further seizures. Continue Dilantin per neurology. Change Dilantin to 100mg  po Q12 per pharmacy, check repeat level in 7 days. No driving heavy machinery or automobiles, no climbing heights or staying in standing water for 6-12 months or until cleared by neurologist/primary care MD  3. Cough--continues to improve. Breathing well. Repeat chest x-ray demonstrates improvement. As patient has improved on Rocephin for UTI complicated pneumonia is doubted and no indication  for atypical agents. Complete 3 days of Augmentin.  4. Hypokalemia, Hypomagnesemia, hypophosphotemia--repleted. 5. S/p acute respiratory failure--intubated for airwary  protection.    6. Acute renal failure--resolved.  7. UTI, likely strep bovis--finished Rocephin. Discussed with Dr. Dulce Sellar, given stroke, recommends recovery and outpatient follow-up rather than inpatient workup.  8. Anemia--chronic, seen 06/2008, stable currently. Follow-up as outpatient.    9. Thrombocytopenia--chronic, seen 06/2008, stable currently. Follow-up as outpatient.   Consultants:  Admitted by [facility]   Neurology/Stroke Team   [facility]   ST--dysphagia 3 diet, thin liquid, meds crushed with puree.   OT--no follow-up   PT--HHPT  Procedures:  EEG--"This is a normal EEG. Wakefulness could not be fully evaluated due to artifact."  2d echo--Left ventricle: Possible mild hypokinesis of the base-inferior wall. The cavity size was normal. Wall thickness was normal. The estimated ejection fraction was60%   12/19 Findings consistent with 40-59%stenosis involving the right internal carotid artery. - No evidence of stenosis involving the left internalcarotid artery. - Bilateral vertebral artery flow antegrade.  Antibiotics:  Rocephin 12/18 >> 12/22   Augmentin 12/23 >> 12/25  Discharge Instructions  Discharge Orders    Future Orders Please Complete By Expires   Diet - low sodium heart healthy      Increase activity slowly      Discharge instructions      Comments:   Be sure to followup with your primary care physician in one week. Recommend mechanical soft diet. Do not restart Plavix. Continue Dilantin to prevent seizures. No driving heavy machinery or other mobiles, no climbing heights or staying in standing water for 6-12 months or until cleared by a neurologist or primary care physician. Call physician for seizure, weakness or worsening of condition.   Home Health      Questions: Responses:   To provide the following care/treatments PT   Face-to-face encounter      Comments:   I Jazell Rosenau certify that this patient is under my care and that I, or a nurse  practitioner or physician's assistant working with me, had a face-to-face encounter that meets the physician face-to-face encounter requirements with this patient on 03/11/2012. The encounter with the patient was in whole, or in part for the following medical condition(s) which is the primary reason for home health care (List medical condition): Stroke, seizure   Questions: Responses:   The encounter with the patient was in whole, or in part, for the following medical condition, which is the primary reason for home health care Stroke, seizure   I certify that, based on my findings, the following services are medically necessary home health services Physical therapy   My clinical findings support the need for the above services Unsafe ambulation due to balance issues   Further, I certify that my clinical findings support that this patient is homebound due to: Unsafe ambulation due to balance issues   To provide the following care/treatments PT       Medication List     As of 03/11/2012  1:15 PM    STOP taking these medications         clopidogrel 75 MG tablet   Commonly known as: PLAVIX      TAKE these medications         amoxicillin-clavulanate 875-125 MG per tablet   Commonly known as: AUGMENTIN   Take 1 tablet by mouth every 12 (twelve) hours.      hydrochlorothiazide 25 MG tablet   Commonly known as: HYDRODIURIL  Take 25 mg by mouth daily.      ISTALOL 0.5 % (DAILY) Soln   Generic drug: Timolol Maleate   Place 1 drop into both eyes daily.      nebivolol 10 MG tablet   Commonly known as: BYSTOLIC   Take 10 mg by mouth daily.      nitroGLYCERIN 0.4 MG SL tablet   Commonly known as: NITROSTAT   Place 0.4 mg under the tongue every 5 (five) minutes as needed.      nystatin 100000 UNIT/ML suspension   Commonly known as: MYCOSTATIN   Take 5 mLs (500,000 Units total) by mouth 4 (four) times daily. Use until thrush resolved 48 hours.      phenytoin 100 MG ER capsule    Commonly known as: DILANTIN   Take 1 capsule (100 mg total) by mouth 2 (two) times daily.      rosuvastatin 10 MG tablet   Commonly known as: CRESTOR   Take 10 mg by mouth at bedtime.      triamcinolone cream 0.1 %   Commonly known as: KENALOG   Apply 1 application topically as needed.         The results of significant diagnostics from this hospitalization (including imaging, microbiology, ancillary and laboratory) are listed below for reference.    Significant Diagnostic Studies: Dg Chest 2 View  03/11/2012  *RADIOLOGY REPORT*  Clinical Data: Confusion, cough, hypertension.  CHEST - 2 VIEW  Comparison: 03/06/2012  Findings: Previous CABG.  Residual oral contrast noted in the visualized upper abdomen.  The patient has been extubated and the nasogastric tube removed.  Biapical peripheral pleural parenchymal disease left greater than right largely stable.  Improved aeration of the right upper lobe.  Small bilateral pleural effusions.  Heart size remains normal.  IMPRESSION:  1.  Extubation with improved aeration of the right upper lobe. 2.  Residual small pleural effusions.   Original Report Authenticated By: D. Andria Rhein, MD    Ct Head Wo Contrast  03/04/2012  *RADIOLOGY REPORT*  Clinical Data: Seizure, unresponsive  CT HEAD WITHOUT CONTRAST  Technique:  Contiguous axial images were obtained from the base of the skull through the vertex without contrast.  Comparison: MRI brain dated 04/16/2008  Findings: 11 x 8 mm hyperdense lesion/hemorrhage in the medial left temporal lobe/hippocampus.  No evidence of extra-axial fluid collection.  No mass lesion, mass effect, or midline shift.  No CT evidence of acute infarction.  Extensive subcortical white matter and periventricular small vessel ischemic changes.  Intracranial atherosclerosis.  Global cortical and central atrophy.  Very mild progression of ventriculomegaly since 2010.  The visualized paranasal sinuses are essentially clear. The mastoid  air cells are unopacified.  No evidence of calvarial fracture.  IMPRESSION: 11 x 8 mm focus of hemorrhage in the left hippocampus.  Atrophy with extensive small vessel ischemic changes and intracranial atherosclerosis.  Very mild progression of ventriculomegaly since 2010.  These results were called by telephone on 03/04/2012 at 2010 hours to Dr. Zadie Rhine, who verbally acknowledged these results.   Original Report Authenticated By: Charline Bills, M.D.    Mr Brain Wo Contrast  03/05/2012  *RADIOLOGY REPORT*  Clinical Data:  Aphasia and seizure.  On Plavix.  MRI BRAIN WITHOUT CONTRAST MRA HEAD WITHOUT CONTRAST  Technique: Multiplanar, multiecho pulse sequences of the brain and surrounding structures were obtained according to standard protocol without intravenous contrast.  Angiographic images of the head were obtained using MRA technique without contrast.  Comparison:  03/04/2012 and 06/21/2008 CT.  04/16/2008 MR.  MRI HEAD  Findings:  1.2 x 1 x 1 cm hematoma medial left temporal lobe (uncus) with mild surrounding vasogenic edema.  Breakthrough of hemorrhage into the left temporal horn with dependent blood noted. The cause of this hemorrhage is indeterminate and may be related to a hypertensive hemorrhage in this patient who is on blood thinner. No underlying masses seen at this level on the remote MR. Recommend follow-up until clearance.  No acute thrombotic infarct.  Remote partially hemorrhagic left occipital lobe infarct with encephalomalacia.  Remote small infarcts centrum semiovale bilaterally, basal ganglia bilaterally and the left thalamus.  Moderate small vessel disease type changes.  Global atrophy.  Ventricular prominence without significant change and may be related to atrophy rather than hydrocephalus.  Mild asymmetry cerebrospinal fluid spaces posterior fossa region greater on the left unchanged.  Paranasal sinus mucosal thickening.  Cervical medullary junction, pituitary region, pineal  region and orbital structures unremarkable.  IMPRESSION: 1.2 x 1 x 1 cm hematoma medial left temporal lobe (uncus) with mild surrounding vasogenic edema.  Breakthrough of hemorrhage into the left temporal horn with dependent blood noted.  Remote infarcts, small vessel disease type changes and atrophy as detailed above.  MRA HEAD  Findings: On source sequences, there are no abnormal vessels in the region of the left temporal lobe (uncus) hematoma.  Anterior circulation without medium or large size vessel significant stenosis or occlusion.  Mild narrowing A1 segment the left anterior cerebral artery.  Tiny bulge of the M1 segment of the middle cerebral artery bilaterally suggestive of origin of vessel rather than aneurysm.  Middle cerebral artery mild to moderate branch vessel irregularity bilaterally.  Mild irregularity vertebral arteries bilaterally.  High-grade stenosis proximal basilar artery with moderate stenosis mid basilar artery.  Nonvisualization AICAs.  Moderate narrowing proximal aspect right posterior cerebral artery with moderate narrowing mid to distal aspect posterior cerebral artery bilaterally.  Irregularity of the superior cerebellar artery bilaterally.  IMPRESSION: No abnormal vessels seen extending to the left temporal lobe (uncus) hematoma).  Intracranial atherosclerotic type changes most notable involving the basilar artery as detailed above.   Original Report Authenticated By: Lacy Duverney, M.D.    Microbiology: Recent Results (from the past 240 hour(s))  URINE CULTURE     Status: Normal   Collection Time   03/04/12  7:09 PM      Component Value Range Status Comment   Specimen Description URINE, RANDOM   Final    Special Requests NONE   Final    Culture  Setup Time 03/04/2012 20:19   Final    Colony Count >=100,000 COLONIES/ML   Final    Culture STREPTOCOCCUS GROUP D;high probability for S.bovis   Final    Report Status 03/07/2012 FINAL   Final    Organism ID, Bacteria  STREPTOCOCCUS GROUP D;high probability for S.bovis   Final   MRSA PCR SCREENING     Status: Normal   Collection Time   03/04/12 11:46 PM      Component Value Range Status Comment   MRSA by PCR NEGATIVE  NEGATIVE Final      Labs: Basic Metabolic Panel:  Lab 03/10/12 8657 03/09/12 0955 03/08/12 0440 03/07/12 0425 03/06/12 0410 03/05/12 0605  NA 140 -- 138 136 135 136  K 3.5 -- 3.2* 3.0* 3.8 3.5  CL 102 -- 104 105 103 101  CO2 25 -- 23 21 19 24   GLUCOSE 113* -- 91 86 110* 124*  BUN 14 --  18 16 18 23   CREATININE 0.83 -- 0.84 0.90 0.91 1.11*  CALCIUM 8.6 -- 8.6 8.3* 8.5 8.8  MG 1.2* 1.2* 1.3* -- 2.0 1.6  PHOS 3.5 2.2* 1.5* -- 3.3 3.2   Liver Function Tests:  Lab 03/11/12 0802 03/04/12 1904  AST -- 36  ALT -- 16  ALKPHOS -- 64  BILITOT -- 0.3  PROT -- 6.8  ALBUMIN 3.2* 3.6   CBC:  Lab 03/08/12 0440 03/07/12 0425 03/06/12 0410 03/05/12 0605 03/04/12 1904  WBC 4.5 5.5 9.7 6.2 6.7  NEUTROABS -- 3.9 -- -- 4.6  HGB 9.5* 9.4* 10.3* 9.6* 10.7*  HCT 27.3* 26.9* 30.1* 27.6* 31.0*  MCV 86.1 87.1 87.5 87.1 86.6  PLT 104* 90* 81* 104* 137*   Cardiac Enzymes:  Lab 03/04/12 1904  CKTOTAL --  CKMB --  CKMBINDEX --  TROPONINI <0.30   BNCBG:  Lab 03/07/12 2141 03/04/12 1844  GLUCAP 90 119*    Principal Problem:  *Cerebral brain hemorrhage Active Problems:  Hypertension  Hypercholesterolemia  CVA (cerebral vascular accident)  Unspecified intracranial hemorrhage  Seizure  Acute respiratory failure  Acute encephalopathy  Anemia  UTI (lower urinary tract infection)   Time coordinating discharge: 40 minutes  Signed:  [name], MD [facility] 03/11/2012, 1:15 PM

## 2012-03-11 NOTE — Progress Notes (Signed)
MEDICATION RELATED CONSULT NOTE - FOLLOW UP   Pharmacy Consult for Phenytoin  Indication: Seizures  Allergies  Allergen Reactions  . Lipitor (Atorvastatin Calcium)     HEADACHES    Patient Measurements: Height: 5\' 4"  (162.6 cm) Weight: 140 lb 10.5 oz (63.8 kg) IBW/kg (Calculated) : 54.7  Adjusted Body Weight:   Vital Signs: Temp: 98.3 F (36.8 C) (12/23 1001) Temp src: Oral (12/23 1001) BP: 123/50 mmHg (12/23 1001) Pulse Rate: 72  (12/23 1001) Intake/Output from previous day: 12/22 0701 - 12/23 0700 In: 1240 [P.O.:1140; IV Piggyback:100] Out: -  Intake/Output from this shift:    Labs:  Basename 03/11/12 0802 03/10/12 0615 03/09/12 0955  WBC -- -- --  HGB -- -- --  HCT -- -- --  PLT -- -- --  APTT -- -- --  CREATININE -- 0.83 --  LABCREA -- -- --  CREATININE -- 0.83 --  CREAT24HRUR -- -- --  MG -- 1.2* 1.2*  PHOS -- 3.5 2.2*  ALBUMIN 3.2* -- --  PROT -- -- --  ALBUMIN 3.2* -- --  AST -- -- --  ALT -- -- --  ALKPHOS -- -- --  BILITOT -- -- --  BILIDIR -- -- --  IBILI -- -- --   Estimated Creatinine Clearance: 41.2 ml/min (by C-G formula based on Cr of 0.83).   Microbiology: Recent Results (from the past 720 hour(s))  URINE CULTURE     Status: Normal   Collection Time   03/04/12  7:09 PM      Component Value Range Status Comment   Specimen Description URINE, RANDOM   Final    Special Requests NONE   Final    Culture  Setup Time 03/04/2012 20:19   Final    Colony Count >=100,000 COLONIES/ML   Final    Culture STREPTOCOCCUS GROUP D;high probability for S.bovis   Final    Report Status 03/07/2012 FINAL   Final    Organism ID, Bacteria STREPTOCOCCUS GROUP D;high probability for S.bovis   Final   MRSA PCR SCREENING     Status: Normal   Collection Time   03/04/12 11:46 PM      Component Value Range Status Comment   MRSA by PCR NEGATIVE  NEGATIVE Final     Medications:  Scheduled:    . antiseptic oral rinse  1 application Mouth Rinse QID  .  chlorhexidine  15 mL Mouth/Throat BID  . enoxaparin (LOVENOX) injection  40 mg Subcutaneous Q24H  . loratadine  10 mg Oral Daily  . magnesium oxide  400 mg Oral BID  . nebivolol  10 mg Oral Daily  . nystatin  5 mL Oral QID  . pantoprazole (PROTONIX) IV  40 mg Intravenous QHS  . phenytoin  100 mg Oral TID  . timolol  1 drop Both Eyes Daily  . [DISCONTINUED] cefTRIAXone (ROCEPHIN)  IV  1 g Intravenous Q24H  . [DISCONTINUED] phosphorus  500 mg Oral TID    Assessment: 78yo female with new seizures probably due to small mesial temporal hemorrhage.  Pt has had no further seizures on Phenytoin.  Steady-state level this AM mildly elevated at 22, no correction for albumin necessary as albumin is > 2.5.  Goal of Therapy:  Phenytoin level 10-20  Plan:  Change Dilantin to 100mg  po Q12   Anne Hanna, PharmD Clinical Pharmacist [facility]

## 2012-03-11 NOTE — Progress Notes (Signed)
Physical Therapy Treatment Patient Details Name: Anne Hanna MRN: [id number] DOB: October 22, 1922 Today's Date: 03/11/2012 Time: 1308-6578 PT Time Calculation (min): 16 min  PT Assessment / Plan / Recommendation Comments on Treatment Session  Pt moving well.  Cont to recommend use of RW initially to ensure safety.  Pt's daughter present entire session & states pt will have 24 hr (A)/(S) for first several days.      Follow Up Recommendations  Home health PT;Supervision/Assistance - 24 hour     Does the patient have the potential to tolerate intense rehabilitation     Barriers to Discharge       Equipment Recommendations    Rolling Walker   Recommendations for Other Services    Frequency Min 4X/week   Plan Discharge plan remains appropriate    Precautions / Restrictions Precautions Precautions: Fall Restrictions Weight Bearing Restrictions: No       Mobility  Bed Mobility Bed Mobility: Supine to Sit;Sitting - Scoot to Edge of Bed Supine to Sit: 6: Modified independent (Device/Increase time);HOB flat Sitting - Scoot to Edge of Bed: 6: Modified independent (Device/Increase time) Transfers Transfers: Sit to Stand;Stand to Sit Sit to Stand: 5: Supervision;With upper extremity assist;From bed Stand to Sit: 5: Supervision;With upper extremity assist;With armrests;To chair/3-in-1 Details for Transfer Assistance: Cues to reinforce safest hand placement & body positioning before sitting.   Ambulation/Gait Ambulation/Gait Assistance: 4: Min guard Ambulation Distance (Feet): 300 Feet Assistive device: Rolling walker Ambulation/Gait Assistance Details: Cues to stay closer to RW & encouragement to increase stride length.  Pt's daughter present & reports her current gait speed is normal for her.   Gait Pattern: Step-through pattern;Decreased stride length Stairs: Yes Stairs Assistance: 4: Min guard Stair Management Technique: Two rails;Step to pattern;Forwards Number of Stairs: 5   Wheelchair Mobility Wheelchair Mobility: No Modified Rankin (Stroke Patients Only) Pre-Morbid Rankin Score: No symptoms Modified Rankin: Moderately severe disability      PT Goals Acute Rehab PT Goals PT Goal Formulation: With patient Time For Goal Achievement: 03/16/12 Potential to Achieve Goals: Good Pt will go Sit to Stand: with modified independence PT Goal: Sit to Stand - Progress: Progressing toward goal Pt will go Stand to Sit: with modified independence PT Goal: Stand to Sit - Progress: Progressing toward goal Pt will Transfer Bed to Chair/Chair to Bed: with modified independence Pt will Ambulate: >150 feet;with supervision;with least restrictive assistive device PT Goal: Ambulate - Progress: Progressing toward goal Pt will Go Up / Down Stairs: 3-5 stairs;with min assist PT Goal: Up/Down Stairs - Progress: Met Additional Goals Additional Goal #1: Pt will score <19 on the DGI indicating a decreased fall risk  Visit Information  Last PT Received On: 03/11/12 Assistance Needed: +1    Subjective Data      Cognition  Overall Cognitive Status: Appears within functional limits for tasks assessed/performed Arousal/Alertness: Awake/alert Orientation Level: Appears intact for tasks assessed Behavior During Session: [name] for tasks performed    Balance     End of Session PT - End of Session Equipment Utilized During Treatment: Gait belt Activity Tolerance: Patient tolerated treatment well Patient left: in chair;with call bell/phone within reach;with family/visitor present Nurse Communication: Mobility status     Verdell Face, [place] [phone] 03/11/2012

## 2012-03-11 NOTE — Progress Notes (Signed)
Occupational Therapy Treatment and Discharge Patient Details Name: Anne Hanna MRN: [id number] DOB: 1922/09/09 Today's Date: 03/11/2012 Time: 4098-1191 OT Time Calculation (min): 19 min  OT Assessment / Plan / Recommendation Comments on Treatment Session This 78 yo female, making progress and is to D/C today. Will sign off with the follow up recommendation still being HHOT and 24 hour S/A.    Follow Up Recommendations  Home health OT;Supervision/Assistance - 24 hour       Equipment Recommendations  None recommended by OT       Frequency Min 3X/week   Plan Discharge plan needs to be updated    Precautions / Restrictions Precautions Precautions: Fall Precaution Comments: right eye lateral visual field cut from old CVA Restrictions Weight Bearing Restrictions: No       ADL  ADL Comments: Pt able to tell me where she might find items for B/D and in her room and then she was able to find them with Supervision from a RW level. Still needing increased A to find her way around the unit with only min VCs.     OT Goals Miscellaneous OT Goals OT Goal: Miscellaneous Goal #1 - Progress: Met OT Goal: Miscellaneous Goal #2 - Progress: Progressing toward goals  Visit Information  Last OT Received On: 03/11/12 Assistance Needed: +1          Cognition  Overall Cognitive Status: Impaired Area of Impairment: Memory Arousal/Alertness: Awake/alert Orientation Level: Appears intact for tasks assessed Behavior During Session: [facility] for tasks performed Memory Deficits: Could not remember her room number even though I had her say it to herself two times after she looked to find what it was and before we left her room.    Mobility   Bed Mobility Bed Mobility: Supine to Sit;Sitting - Scoot to Edge of Bed Supine to Sit: 6: Modified independent (Device/Increase time);HOB flat Sitting - Scoot to Edge of Bed: 6: Modified independent (Device/Increase time) Transfers Transfers: Sit to  Stand;Stand to Sit Sit to Stand: 5: Supervision;With upper extremity assist;With armrests Stand to Sit: With upper extremity assist;With armrests;To chair/3-in-1 Details for Transfer Assistance: VCs for safe hand placement for sit to stand             End of Session OT - End of Session Equipment Utilized During Treatment:  (RW) Activity Tolerance: Patient tolerated treatment well Patient left: in chair;with call bell/phone within reach;with family/visitor present (daughter Bonita Quin)    Evette Georges [phone] 03/11/2012, 2:04 PM

## 2012-03-11 NOTE — Progress Notes (Signed)
Speech Language Pathology Dysphagia Treatment Patient Details Name: Anne Hanna MRN: [id number] DOB: [birth year] Today's Date: 03/11/2012 Time: 1325-1340 SLP Time Calculation (min): 15 min  Assessment / Plan / Recommendation Clinical Impression  Updated posted precautions and reviewed with pt/daughter. Daughter reports pt talks while eating. Encouraged pt to minimize distractions during meals. Min cues required to adhere to precautions. No overt s/s aspiration observed or reported. Pt being Dc'd today.  Contact information provided if needs arise.     Diet Recommendation  Continue with Current Diet: Dysphagia 3 (mechanical soft);Thin liquid    SLP Plan Continue with current plan of care   Pertinent Vitals/Pain None reported   Swallowing Goals  SLP Swallowing Goals Patient will utilize recommended strategies during swallow to increase swallowing safety with: Minimal assistance Swallow Study Goal #2 - Progress: Progressing toward goal  General Temperature Spikes Noted: No Respiratory Status: Room air Behavior/Cognition: Alert;Cooperative;Pleasant mood;Hard of hearing;Requires cueing Patient Positioning: Upright in chair  Dysphagia Treatment Treatment focused on: Skilled observation of diet tolerance;Patient/family/caregiver education;Utilization of compensatory strategies Family/Caregiver Educated: Pt and daughter Treatment Methods/Modalities: Skilled observation Patient observed directly with PO's: Yes Type of PO's observed: Thin liquids;Dysphagia 3 (soft) Feeding: Able to feed self Liquids provided via: Cup Pharyngeal Phase Signs & Symptoms: Multiple swallows Type of cueing: Verbal Amount of cueing: Minimal      Anne Hanna B. Tony, [facility], CCC-SLP [phone]  Anne Hanna 03/11/2012, 1:46 PM

## 2012-03-11 NOTE — Progress Notes (Signed)
[facility] PROGRESS NOTE  Anne Hanna RUE:[id number] DOB: September 23, 1922 DOA: 03/04/2012 PCP: [name], MD  Assessment/Plan: 1. Small mesial temporal hemorrhage--Etiology likely hemorrhagic infarct from intracranial atherosclerotic disease. Start ASA 81mg  daily after one week. Consider repeat CT head prior to starting ASA. Follow up with Dr. Pearlean Brownie, Stroke Clinic in 2 months. PT/OT noted. Dysphagia 3 (Mechanical Soft);Thin liquid.  2. Seizure--no further seizures. Continue Dilantin per neurology. Change Dilantin to 100mg  po Q12 per pharmacy, check repeat level in 7 days. No driving heavy machinery or automobiles, no climbing heights or staying in standing water for 6-12 months or until cleared by neurologist/primary care MD 3. Cough--continues to improve. Breathing well. Repeat chest x-ray demonstrates improvement. As patient has improved on Rocephin for UTI complicated pneumonia is doubted and no indication for atypical agents. Complete 3 days of Augmentin. 4. Hypokalemia, Hypomagnesemia, hypophosphotemia--repleted magnesium. 5. S/p acute respiratory failure--intubated for airwary protection.  6. Acute renal failure--resolved. 7. UTI, likely strep bovis--finished Rocephin. Discussed with Dr. Dulce Sellar, given stroke, recommends recovery and outpatient follow-up rather than inpatient workup. 8. Anemia--chronic, seen 06/2008, stable currently. Follow-up as outpatient.  9. Thrombocytopenia--chronic, seen 06/2008, stable currently. Follow-up as outpatient.   Code Status: full code Family Communication: none today Disposition Plan: home with HHPT  Anne Sacks, MD  [facility] Team 6 Pager [phone] If 8PM-8AM, please contact night-coverage at [email or web address], password [facility] 03/11/2012, 12:16 PM  LOS: 7 days   Brief narrative: 78 y.o. female who was speaking on the phone day of admission and started speaking gibberish. Her daughter called EMS and on arrival she was seen to have a  seizure. She remained altered in the ER and was intubated. CT showed a small mesial temporal hemorrhage.   D/w neuro dr Amada Jupiter. Pt not a surgical candidate. 78 yo F with small intraparenchymal hemorrahge and seizures. Loaded with Fosphenytoin. Goal SBP < 160. Plavix held.  Consultants:  Admitted by [facility]  Neurology/Stroke Team  [facility]  ST--dysphagia 3 diet, thin liquid, meds crushed with puree.  OT--no follow-up  PT--HHPT  Procedures:  EEG--"This is a normal EEG. Wakefulness could not be fully evaluated due to artifact."  2d echo--Left ventricle: Possible mild hypokinesis of the base-inferior wall. The cavity size was normal. Wall thickness was normal. The estimated ejection fraction was 60%  12/19 Findings consistent with 40-59%stenosis involving the right internal carotid artery. - No evidence of stenosis involving the left internal carotid artery. - Bilateral vertebral artery flow antegrade.  Antibiotics:  Rocephin 12/18 >> 12/22  Augmentin 12/23 >> 12/25  HPI/Subjective: Feels fine. No problems. Ready to go home.  Objective: Filed Vitals:   03/11/12 0155 03/11/12 0549 03/11/12 0614 03/11/12 1001  BP: 124/50 143/49  123/50  Pulse: 71 76  72  Temp: 99 F (37.2 C) 98.6 F (37 C)  98.3 F (36.8 C)  TempSrc: Oral Oral  Oral  Resp: 16 17  18   Height:      Weight:   63.8 kg (140 lb 10.5 oz)   SpO2: 98% 95%  94%    Intake/Output Summary (Last 24 hours) at 03/11/12 1216 Last data filed at 03/11/12 0700  Gross per 24 hour  Intake    640 ml  Output      0 ml  Net    640 ml   Filed Weights   03/09/12 0500 03/10/12 0443 03/11/12 0614  Weight: 63.7 kg (140 lb 6.9 oz) 62.8 kg (138 lb 7.2 oz) 63.8 kg (140 lb 10.5 oz)  Exam:  General:  Appears calm and comfortable Cardiovascular: RRR, no m/r/g. Ambulated with a walker. Respiratory: CTA bilaterally, no w/r/r. Normal respiratory effort. Psychiatric: grossly normal mood and affect, speech fluent and  appropriate  Data Reviewed: Basic Metabolic Panel:  Lab 03/10/12 9811 03/09/12 0955 03/08/12 0440 03/07/12 0425 03/06/12 0410 03/05/12 0605  NA 140 -- 138 136 135 136  K 3.5 -- 3.2* 3.0* 3.8 3.5  CL 102 -- 104 105 103 101  CO2 25 -- 23 21 19 24   GLUCOSE 113* -- 91 86 110* 124*  BUN 14 -- 18 16 18 23   CREATININE 0.83 -- 0.84 0.90 0.91 1.11*  CALCIUM 8.6 -- 8.6 8.3* 8.5 8.8  MG 1.2* 1.2* 1.3* -- 2.0 1.6  PHOS 3.5 2.2* 1.5* -- 3.3 3.2   Liver Function Tests:  Lab 03/11/12 0802 03/04/12 1904  AST -- 36  ALT -- 16  ALKPHOS -- 64  BILITOT -- 0.3  PROT -- 6.8  ALBUMIN 3.2* 3.6   CBC:  Lab 03/08/12 0440 03/07/12 0425 03/06/12 0410 03/05/12 0605 03/04/12 1904  WBC 4.5 5.5 9.7 6.2 6.7  NEUTROABS -- 3.9 -- -- 4.6  HGB 9.5* 9.4* 10.3* 9.6* 10.7*  HCT 27.3* 26.9* 30.1* 27.6* 31.0*  MCV 86.1 87.1 87.5 87.1 86.6  PLT 104* 90* 81* 104* 137*   Cardiac Enzymes:  Lab 03/04/12 1904  CKTOTAL --  CKMB --  CKMBINDEX --  TROPONINI <0.30   CBG:  Lab 03/07/12 2141 03/04/12 1844  GLUCAP 90 119*    Recent Results (from the past 240 hour(s))  URINE CULTURE     Status: Normal   Collection Time   03/04/12  7:09 PM      Component Value Range Status Comment   Specimen Description URINE, RANDOM   Final    Special Requests NONE   Final    Culture  Setup Time 03/04/2012 20:19   Final    Colony Count >=100,000 COLONIES/ML   Final    Culture STREPTOCOCCUS GROUP D;high probability for S.bovis   Final    Report Status 03/07/2012 FINAL   Final    Organism ID, Bacteria STREPTOCOCCUS GROUP D;high probability for S.bovis   Final   MRSA PCR SCREENING     Status: Normal   Collection Time   03/04/12 11:46 PM      Component Value Range Status Comment   MRSA by PCR NEGATIVE  NEGATIVE Final      Studies: Dg Chest 2 View  03/11/2012  *RADIOLOGY REPORT*  Clinical Data: Confusion, cough, hypertension.  CHEST - 2 VIEW  Comparison: 03/06/2012  Findings: Previous CABG.  Residual oral contrast noted  in the visualized upper abdomen.  The patient has been extubated and the nasogastric tube removed.  Biapical peripheral pleural parenchymal disease left greater than right largely stable.  Improved aeration of the right upper lobe.  Small bilateral pleural effusions.  Heart size remains normal.  IMPRESSION:  1.  Extubation with improved aeration of the right upper lobe. 2.  Residual small pleural effusions.   Original Report Authenticated By: D. Andria Rhein, MD     Scheduled Meds:    . antiseptic oral rinse  1 application Mouth Rinse QID  . chlorhexidine  15 mL Mouth/Throat BID  . enoxaparin (LOVENOX) injection  40 mg Subcutaneous Q24H  . loratadine  10 mg Oral Daily  . magnesium oxide  400 mg Oral BID  . nebivolol  10 mg Oral Daily  . nystatin  5 mL Oral  QID  . pantoprazole (PROTONIX) IV  40 mg Intravenous QHS  . phenytoin  100 mg Oral BID  . timolol  1 drop Both Eyes Daily   Continuous Infusions:    . sodium chloride 20 mL/hr at 03/07/12 1100    Principal Problem:  *Cerebral brain hemorrhage Active Problems:  Hypertension  Hypercholesterolemia  CVA (cerebral vascular accident)  Unspecified intracranial hemorrhage  Seizure  Acute respiratory failure  Acute encephalopathy  Anemia  UTI (lower urinary tract infection)     Anne Sacks, MD  [facility] Team 6 Pager [phone] If 8PM-8AM, please contact night-coverage at [email or web address], password [facility] 03/11/2012, 12:16 PM  LOS: 7 days

## 2012-03-12 NOTE — Care Management Note (Signed)
    Page 1 of 2   03/12/2012     1:23:14 PM   CARE MANAGEMENT NOTE 03/12/2012  Patient:  Anne Hanna, Anne Hanna   Account Number:  [id number]  Date Initiated:  03/12/2012  Documentation initiated by:  [facility]  Subjective/Objective Assessment:   Admitted with seizure     Action/Plan:   Pt/Ot evals- recommended HHPT and rolling walker   Anticipated DC Date:     Anticipated DC Plan:        DC Planning Services  CM consult      Choice offered to / List presented to:  C-1 Patient   DME arranged  Levan Hurst      DME agency  [facility]     HH arranged  HH-2 PT      [facility] agency  [facility]   Status of service:  Completed, signed off Medicare Important Message given?   (If response is "NO", the following Medicare IM given date fields will be blank) Date Medicare IM given:   Date Additional Medicare IM given:    Discharge Disposition:  HOME W HOME HEALTH SERVICES  Per UR Regulation:  Reviewed for med. necessity/level of care/duration of stay  If discussed at Long Length of Stay Meetings, dates discussed:    Comments:  03/12/12 Spoke with patient and her daughter on 03/11/12 about HHC. Patient chose [facility] from the [facility] agencies  list.Contacted Corrie Dandy at [facility] on 03/11/12 and requested HHPT. Rolling walker was delivered to patient's room by [facility] prior to patient's discharged on 03/11/12.  Jacquelynn Cree RN, BSN, CCM

## 2012-04-12 ENCOUNTER — Ambulatory Visit (INDEPENDENT_AMBULATORY_CARE_PROVIDER_SITE_OTHER): Payer: Medicare Other | Admitting: Cardiology

## 2012-04-12 ENCOUNTER — Encounter: Payer: Self-pay | Admitting: Cardiology

## 2012-04-12 VITALS — BP 126/68 | HR 72 | Resp 18 | Ht 64.0 in | Wt 129.0 lb

## 2012-04-12 DIAGNOSIS — I629 Nontraumatic intracranial hemorrhage, unspecified: Secondary | ICD-10-CM

## 2012-04-12 DIAGNOSIS — I639 Cerebral infarction, unspecified: Secondary | ICD-10-CM

## 2012-04-12 DIAGNOSIS — I2510 Atherosclerotic heart disease of native coronary artery without angina pectoris: Secondary | ICD-10-CM

## 2012-04-12 DIAGNOSIS — I10 Essential (primary) hypertension: Secondary | ICD-10-CM

## 2012-04-12 DIAGNOSIS — I6350 Cerebral infarction due to unspecified occlusion or stenosis of unspecified cerebral artery: Secondary | ICD-10-CM

## 2012-04-12 MED ORDER — NITROGLYCERIN 0.4 MG SL SUBL: 0.4000 mg | SUBLINGUAL_TABLET | SUBLINGUAL | Status: AC | PRN

## 2012-04-12 NOTE — Progress Notes (Signed)
Anne Hanna Date of Birth: 1922-06-22   History of Present Illness: Anne Hanna is seen today for followup of her coronary disease. She was admitted in December with a hemorrhagic stroke involving the temporal lobe. She had a seizure. She initially was on Dilantin but developed a severe rash. This was switched to Keppra. During her hospital stay she had a urinary tract infection with culture growing strep bovis. She has been seen by GI and colonoscopy recommended. She has made an excellent recovery from a neurologic standpoint. She still feels generally weak. She has completed her physical therapy. She is taking a baby aspirin daily now. Blood pressure has been well-controlled since her stroke.   Current Outpatient Prescriptions on File Prior to Visit  Medication Sig Dispense Refill  . hydrochlorothiazide 25 MG tablet Take 25 mg by mouth daily.        Marland Kitchen levETIRAcetam (KEPPRA) 250 MG tablet Take 250 mg by mouth every 12 (twelve) hours.      . nebivolol (BYSTOLIC) 10 MG tablet Take 10 mg by mouth daily.      . nitroGLYCERIN (NITROSTAT) 0.4 MG SL tablet Place 1 tablet (0.4 mg total) under the tongue every 5 (five) minutes as needed.  25 tablet  3  . rosuvastatin (CRESTOR) 10 MG tablet Take 10 mg by mouth at bedtime.      . Timolol Maleate (ISTALOL) 0.5 % (DAILY) SOLN Place 1 drop into both eyes daily.      Marland Kitchen triamcinolone cream (KENALOG) 0.1 % Apply 1 application topically as needed.        Allergies  Allergen Reactions  . Lipitor (Atorvastatin Calcium)     HEADACHES    Past Medical History  Diagnosis Date  . Coronary artery disease     STATUS POST CABG  . Hypertension   . Hypercholesterolemia   . CVA (cerebral vascular accident)     OCULAR CVA  . Hx: UTI (urinary tract infection)   . Glaucoma(365)     History of glaucoma  . Bronchopneumonia     Right lung  . Urinary tract infection     Escherichia coli urinary tract infection  . Acute sinusitis   . Thrombocytopenia 2010      platelet count of 96 at discharge (95 on admission)  . Hyperlipidemia   . History of transient ischemic attack   . Hyponatremia     volume depletion - resolved  . Volume overload     History of postoperative volume overload  . Delirium     Postoperative delirium  . Cataract   . CVA (cerebral infarction)     temporal hemorhage    Past Surgical History  Procedure Date  . Cardiac catheterization 10/05/2006    NORMAL LEFT VENTRICULAR SIZE. THERE IS MODERATE GLOBAL HYPOKINESIA WITH OVERALL EF 40%  . Coronary artery bypass graft 09/2006    LIMA GRAFT TO THE LAD, SAPHENOUS VEIN GRAFT TO THE FIRST OBTUSE MARGINAL VESSEL WITH SEQUENTIAL GRAFT TO THE SECOND MARGINAL VESSEL , AND SAPHENOUS VEIN GRAFT TO THE PDA  . Cataract surgery     Bilateral cataract extraction-- x2  . Tendon repair     Status post repair of tendon of the left index finger    History  Smoking status  . Never Smoker   Smokeless tobacco  . Not on file    History  Alcohol Use  . Yes    Family History  Problem Relation Age of Onset  . Heart failure Mother   .  Heart failure Father   . Heart disease Brother     Review of Systems:  As noted in HPI. All other systems were reviewed and are negative.  Physical Exam: BP 126/68  Pulse 72  Resp 18  Ht 5\' 4"  (1.626 m)  Wt 129 lb (58.514 kg)  BMI 22.14 kg/m2  SpO2 92% She is a pleasant white female in no acute distress. HEENT exam is unremarkable. Pupils equal round and reactive, sclera clear. Oropharynx is clear and tongue is midline. She has no JVD or bruits. Lungs are clear. Cardiac exam reveals a regular rate and rhythm without gallop, murmur, or click. Abdomen is soft nontender without mass. Bowel sounds positive. She has no edema. Pedal pulses are good. She is alert and oriented x3. Cranial nerves II through XII are intact. No focal findings.   LABORATORY DATA:   Assessment / Plan: 1. Hemorrhagic CVA involving the temporal lobe. Clinically she has made  an excellent recovery. Continue baby aspirin.  2.Seizure. Now on Keppra.  3. Strep bovis UTI.  4. Hypertension, controlled.  5. Coronary disease status post CABG in 2008. No significant anginal symptoms.  6. Hyperlipidemia.  The family was concerned about her having a colonoscopy this soon after her stroke. Apparently this is scheduled for next week. I think it would be reasonable to wait 3 months from her stroke before proceeding with invasive procedures especially since she would require sedation.

## 2012-04-12 NOTE — Patient Instructions (Signed)
Continue your current therapy  I will see you again in 6 months.   

## 2012-04-15 ENCOUNTER — Telehealth: Payer: Self-pay | Admitting: Cardiology

## 2012-04-15 NOTE — Telephone Encounter (Signed)
They need forms to get a handicap sticker

## 2012-04-15 NOTE — Telephone Encounter (Signed)
Spoke to patient's daughter Bonita Quin was Dr.Jordan not in office today will have Dr.Jordan sign handicap parking sticker 04/16/12 and I will call her back when ready.

## 2012-04-17 ENCOUNTER — Encounter (HOSPITAL_COMMUNITY): Payer: Self-pay

## 2012-04-17 ENCOUNTER — Ambulatory Visit (HOSPITAL_COMMUNITY): Admit: 2012-04-17 | Payer: Medicare Other | Admitting: Gastroenterology

## 2012-04-17 SURGERY — COLONOSCOPY
Anesthesia: Moderate Sedation

## 2012-04-17 NOTE — Telephone Encounter (Signed)
Handicap parking sticker signed by Dr.Jordan and mailed to patient.

## 2012-06-14 ENCOUNTER — Other Ambulatory Visit: Payer: Self-pay | Admitting: Gastroenterology

## 2012-06-14 DIAGNOSIS — D649 Anemia, unspecified: Secondary | ICD-10-CM

## 2012-06-14 DIAGNOSIS — R102 Pelvic and perineal pain: Secondary | ICD-10-CM

## 2012-06-14 DIAGNOSIS — A491 Streptococcal infection, unspecified site: Secondary | ICD-10-CM

## 2012-06-21 ENCOUNTER — Ambulatory Visit
Admission: RE | Admit: 2012-06-21 | Discharge: 2012-06-21 | Disposition: A | Discharge status: Home / Self Care | Payer: Medicare Other | Source: Ambulatory Visit | Attending: Gastroenterology | Admitting: Gastroenterology

## 2012-06-21 DIAGNOSIS — D649 Anemia, unspecified: Secondary | ICD-10-CM

## 2012-06-21 DIAGNOSIS — R102 Pelvic and perineal pain: Secondary | ICD-10-CM

## 2012-06-21 DIAGNOSIS — A491 Streptococcal infection, unspecified site: Secondary | ICD-10-CM

## 2012-06-21 MED ORDER — IOHEXOL 300 MG/ML  SOLN
100.0000 mL | Freq: Once | INTRAMUSCULAR | Status: AC | PRN
  Administered 2012-06-21: 100 mL via INTRAVENOUS

## 2012-06-26 ENCOUNTER — Other Ambulatory Visit: Payer: Self-pay | Admitting: Cardiology

## 2012-07-26 ENCOUNTER — Other Ambulatory Visit: Payer: Self-pay | Admitting: Neurology

## 2012-08-29 ENCOUNTER — Encounter: Payer: Self-pay | Admitting: Neurology

## 2012-08-29 ENCOUNTER — Ambulatory Visit (INDEPENDENT_AMBULATORY_CARE_PROVIDER_SITE_OTHER): Payer: Medicare Other | Admitting: Neurology

## 2012-08-29 VITALS — BP 171/76 | HR 65 | Ht 64.7 in | Wt 133.0 lb

## 2012-08-29 DIAGNOSIS — R413 Other amnesia: Secondary | ICD-10-CM

## 2012-08-29 DIAGNOSIS — H538 Other visual disturbances: Secondary | ICD-10-CM | POA: Insufficient documentation

## 2012-08-29 NOTE — Patient Instructions (Signed)
Continue Keppra for seizures and aspirin for stroke prevention. Maintain strict control of hypertension with blood pressure goal below 130/90 and lipids with LDL cholesterol goal below 100 mg percent. I have also encouraged her to be involved and mentally challenging activities like playing bridge, sudoku, solving crossword puzzles to help with her mild memory difficulties. She may have cataract surgery if necessary. She will return for followup in 6 months with Larita Fife, NP.

## 2012-08-29 NOTE — Progress Notes (Signed)
[facility] [address] [place]. [place] [address] [phone]       OFFICE FOLLOW-UP NOTE  [name] Date of Birth:  07-05-1922 Medical Record Number:  [id number]   HPI:  50 year lady who at hemorrhagic  left medial temporal infarct in December 2013 with post stroke seizures and mild memory loss. Remote history of left occipital and right subinsular infarcts with vascular risk factors of basilar artery stenosis, hypertension, hyperlipidemia and cerebrovascular disease. 08/29/12  She is seen for followup today after her last visit on 05/27/12. She continues to do well and has not had any further episodes of memory loss ,confusion ,seizures or strokes. She remains on aspirin which is tolerating well without bleeding, bruising or other side effects. She states that her cholesterol is under good control and was checked by her primary physician but I do not have the results. Her blood pressure today slightly elevated but she states that this usually fine. She plans on having cataract surgery and wants clearance for that. She did not undergo colonoscopy after last visit but he instead underwent CT colonography which was fine. She underwent followup  EEG study on 05/28/12 which showed mild by hemispheric diffuse slowing without definite atrophy from activity. ROS:   14 system review of systems is positive for fatigue, memory loss, attention difficulties only  PMH:  Past Medical History  Diagnosis Date  . Coronary artery disease     STATUS POST CABG  . Hypertension   . Hypercholesterolemia   . CVA (cerebral vascular accident)     OCULAR CVA  . Hx: UTI (urinary tract infection)   . Glaucoma     History of glaucoma  . Bronchopneumonia     Right lung  . Urinary tract infection     Escherichia coli urinary tract infection  . Acute sinusitis   . Thrombocytopenia 2010     platelet count of 96 at discharge (95 on admission)  . Hyperlipidemia   . History of transient  ischemic attack   . Hyponatremia     volume depletion - resolved  . Volume overload     History of postoperative volume overload  . Delirium     Postoperative delirium  . Cataract   . CVA (cerebral infarction)     temporal hemorhage    Social History:  History   Social History  . Marital Status: Widowed    Spouse Name: N/A    Number of Children: 5  . Years of Education: N/A   Occupational History  . business office    Social History Main Topics  . Smoking status: Never Smoker   . Smokeless tobacco: Not on file  . Alcohol Use: Yes  . Drug Use: No  . Sexually Active:    Other Topics Concern  . Not on file   Social History Narrative  . No narrative on file    Medications:   Current Outpatient Prescriptions on File Prior to Visit  Medication Sig Dispense Refill  . aspirin 81 MG tablet Take 81 mg by mouth daily.      . hydrochlorothiazide 25 MG tablet Take 25 mg by mouth daily.        Marland Kitchen levETIRAcetam (KEPPRA) 250 MG tablet TAKE ONE TABLET BY MOUTH TWICE DAILY  60 tablet  6  . nebivolol (BYSTOLIC) 10 MG tablet Take 10 mg by mouth daily.      . nitroGLYCERIN (NITROSTAT) 0.4 MG SL tablet Place 1 tablet (0.4 mg total)  under the tongue every 5 (five) minutes as needed.  25 tablet  3  . rosuvastatin (CRESTOR) 10 MG tablet Take 10 mg by mouth at bedtime.      . Timolol Maleate (ISTALOL) 0.5 % (DAILY) SOLN Place 1 drop into both eyes daily.       No current facility-administered medications on file prior to visit.    Allergies:   Allergies  Allergen Reactions  . Dilantin (Phenytoin)   . Lipitor (Atorvastatin Calcium)     HEADACHES   Filed Vitals:   08/29/12 1607  BP: 171/76  Pulse: 65    Physical Exam General: frail elderly Caucasian lady in no distress Head: head normocephalic and atraumatic. Orohparynx benign Neck: supple with no carotid or supraclavicular bruits Cardiovascular: regular rate and rhythm, no murmurs Musculoskeletal: no deformity Skin:  no  rash/petichiae Vascular:  Normal pulses all extremities  Neurologic Exam Mental Status: Awake and fully alert. Oriented to place and time. Mild reduced attention span, concentration and fund of knowledge appropriate. Mood and affect appropriate. Animal Naming 6 only. Recall 1/3 only. Cranial Nerves: Fundoscopic exam reveals sharp disc margins. Pupils equal, briskly reactive to light. Extraocular movements full without nystagmus. Visual fields full show dense right hemianopsia to confrontation. Hearing diminished bilaterally.. Facial sensation intact. Face, tongue, palate moves normally and symmetrically.  Motor: Normal bulk and tone. Normal strength in all tested extremity muscles. Sensory.: intact to tough and pinprick and vibratory.  Coordination: Rapid alternating movements normal in all extremities. Finger-to-nose and heel-to-shin performed accurately bilaterally. Gait and Station: Arises from chair without difficulty. Stance is normal. Gait demonstrates normal stride length and balance . Able to heel, toe and tandem walk without difficulty.  Reflexes: 1+ and symmetric. Toes downgoing.     ASSESSMENT: 85 year lady who at hemorrhagic  left medial temporal infarct in December 2013 with post stroke seizures and mild memory loss. Remote history of left occipital and right subinsular infarcts with vascular risk factors of basilar artery stenosis, hypertension, hyperlipidemia and cerebrovascular disease.    PLAN: Continue Keppra for seizure prophylaxis and aspirin for stroke prevention with strict control of hypertension with blood pressure goal below 130/90 and lipids with LDL cholesterol goal below 100 mg percent. I have encouraged her to participate in cognitively challenging activities like reading, solving crossword puzzles, sodoku and playing bridge. Return for followup in 6 months with Anne Fife, NP

## 2012-10-09 ENCOUNTER — Encounter: Payer: Self-pay | Admitting: Cardiology

## 2012-10-09 ENCOUNTER — Ambulatory Visit (INDEPENDENT_AMBULATORY_CARE_PROVIDER_SITE_OTHER): Payer: Medicare Other | Admitting: Cardiology

## 2012-10-09 VITALS — BP 150/62 | HR 64 | Ht 65.0 in | Wt 134.0 lb

## 2012-10-09 DIAGNOSIS — I6350 Cerebral infarction due to unspecified occlusion or stenosis of unspecified cerebral artery: Secondary | ICD-10-CM

## 2012-10-09 DIAGNOSIS — I2510 Atherosclerotic heart disease of native coronary artery without angina pectoris: Secondary | ICD-10-CM

## 2012-10-09 DIAGNOSIS — I10 Essential (primary) hypertension: Secondary | ICD-10-CM

## 2012-10-09 DIAGNOSIS — I639 Cerebral infarction, unspecified: Secondary | ICD-10-CM

## 2012-10-09 NOTE — Progress Notes (Signed)
Anne Hanna Date of Birth: Aug 21, 1922   History of Present Illness: Anne Hanna is seen today for followup of her coronary disease. She is status post CABG in 2008. She has a history of a hemorrhagic stroke in December 2013 involving the temporal lobe. She has done well from a cardiac standpoint without any recurrent symptoms of chest pain, shortness of breath, or palpitations. She is on Keppra for seizure prophylaxis. She did have a urinary tract infection during her hospital stay that grew strep bovis. Subsequent CT of the abdomen showed no evidence of colon mass. She was noted to have an incidental gallstone and diverticulosis. A hepatic cyst was noted. She recently has had bilateral cataract surgery.   Current Outpatient Prescriptions on File Prior to Visit  Medication Sig Dispense Refill  . aspirin 81 MG tablet Take 81 mg by mouth daily.      . hydrochlorothiazide 25 MG tablet Take 25 mg by mouth daily.        Marland Kitchen levETIRAcetam (KEPPRA) 250 MG tablet TAKE ONE TABLET BY MOUTH TWICE DAILY  60 tablet  6  . nebivolol (BYSTOLIC) 10 MG tablet Take 10 mg by mouth daily.      . nitroGLYCERIN (NITROSTAT) 0.4 MG SL tablet Place 1 tablet (0.4 mg total) under the tongue every 5 (five) minutes as needed.  25 tablet  3  . rosuvastatin (CRESTOR) 10 MG tablet Take 10 mg by mouth at bedtime.      . Timolol Maleate (ISTALOL) 0.5 % (DAILY) SOLN Place 1 drop into both eyes daily.       No current facility-administered medications on file prior to visit.    Allergies  Allergen Reactions  . Dilantin (Phenytoin)   . Lipitor (Atorvastatin Calcium)     HEADACHES    Past Medical History  Diagnosis Date  . Coronary artery disease     STATUS POST CABG  . Hypertension   . Hypercholesterolemia   . CVA (cerebral vascular accident)     OCULAR CVA  . Hx: UTI (urinary tract infection)   . Glaucoma     History of glaucoma  . Bronchopneumonia     Right lung  . Urinary tract infection     Escherichia  coli urinary tract infection  . Acute sinusitis   . Thrombocytopenia 2010     platelet count of 96 at discharge (95 on admission)  . Hyperlipidemia   . History of transient ischemic attack   . Hyponatremia     volume depletion - resolved  . Volume overload     History of postoperative volume overload  . Delirium     Postoperative delirium  . Cataract   . CVA (cerebral infarction)     temporal hemorhage  . Gallstones   . Hepatic cyst     Past Surgical History  Procedure Laterality Date  . Cardiac catheterization  10/05/2006    NORMAL LEFT VENTRICULAR SIZE. THERE IS MODERATE GLOBAL HYPOKINESIA WITH OVERALL EF 40%  . Coronary artery bypass graft  09/2006    LIMA GRAFT TO THE LAD, SAPHENOUS VEIN GRAFT TO THE FIRST OBTUSE MARGINAL VESSEL WITH SEQUENTIAL GRAFT TO THE SECOND MARGINAL VESSEL , AND SAPHENOUS VEIN GRAFT TO THE PDA  . Cataract surgery      Bilateral cataract extraction-- x2  . Tendon repair      Status post repair of tendon of the left index finger    History  Smoking status  . Never Smoker   Smokeless tobacco  .  Not on file    History  Alcohol Use  . Yes    Family History  Problem Relation Age of Onset  . Heart failure Mother   . Heart failure Father   . Heart disease Brother     Review of Systems:  As noted in HPI. All other systems were reviewed and are negative.  Physical Exam: BP 150/62  Pulse 64  Ht 5\' 5"  (1.651 m)  Wt 134 lb (60.782 kg)  BMI 22.3 kg/m2 She is a pleasant white female in no acute distress. HEENT exam is unremarkable. Pupils equal round and reactive, sclera clear. Oropharynx is clear and tongue is midline. She has no JVD or bruits. Lungs are clear. Cardiac exam reveals a regular rate and rhythm without gallop, murmur, or click. Abdomen is soft nontender without mass. Bowel sounds positive. She has no edema. Pedal pulses are good. She is alert and oriented x3. Cranial nerves II through XII are intact. No focal findings.   LABORATORY  DATA:   Assessment / Plan: 1. Hemorrhagic CVA involving the temporal lobe. Clinically she has made an excellent recovery. Continue baby aspirin.  2.Seizure. Now on Keppra.  3. Strep bovis UTI. Resolved  4. Hypertension, controlled.  5. Coronary disease status post CABG in 2008. No significant anginal symptoms.  6. Hyperlipidemia.

## 2012-10-09 NOTE — Patient Instructions (Signed)
Continue your current therapy  I will see you in 6 months.   

## 2012-12-03 ENCOUNTER — Telehealth: Payer: Self-pay | Admitting: *Deleted

## 2012-12-03 NOTE — Telephone Encounter (Signed)
Lynden Ang calling are: mother.   She is taking keppra 250mg  po bid.  Her pharmacy at [facility] changed the generic manufacturer and pt started this 2 mo ago.  Noted to have sx of decreased memory, leg wobbliness, periodic headaches 4-5 wks into taking new generic brand.  Questioning getting keppra level to see what it is and if this causing her issues.  Started her the previous brand 12-02-12.  She takes her med 0800 and before bed.  Will be ok to wait for 12-04-12 when Dr. Pearlean Brownie back in office.  [phone].

## 2012-12-04 NOTE — Telephone Encounter (Signed)
No need to get keppra level as it has not been shown to be of clinical benefit. Continue Keppra and the current dose which is quite low and I do not think patient isn't having any side effects from it

## 2012-12-05 NOTE — Telephone Encounter (Signed)
I called Anne Hanna back, after speaking with Dr. Pearlean Brownie,  and spoke to her sister, Anne Hanna.  If after 2 wks on the original generic keppra, and no change then see pcp, if thinks neurological then to call us back.

## 2013-01-14 ENCOUNTER — Other Ambulatory Visit: Payer: Self-pay | Admitting: Family Medicine

## 2013-01-14 DIAGNOSIS — R160 Hepatomegaly, not elsewhere classified: Secondary | ICD-10-CM

## 2013-01-17 ENCOUNTER — Ambulatory Visit
Admission: RE | Admit: 2013-01-17 | Discharge: 2013-01-17 | Disposition: A | Discharge status: Home / Self Care | Payer: Medicare Other | Source: Ambulatory Visit | Attending: Family Medicine | Admitting: Family Medicine

## 2013-01-17 DIAGNOSIS — R160 Hepatomegaly, not elsewhere classified: Secondary | ICD-10-CM

## 2013-01-17 MED ORDER — IOHEXOL 300 MG/ML  SOLN: 100.0000 mL | Freq: Once | INTRAMUSCULAR | Status: AC | PRN

## 2013-02-16 ENCOUNTER — Other Ambulatory Visit: Payer: Self-pay | Admitting: Neurology

## 2013-02-27 ENCOUNTER — Ambulatory Visit (INDEPENDENT_AMBULATORY_CARE_PROVIDER_SITE_OTHER): Payer: Medicare Other | Admitting: Nurse Practitioner

## 2013-02-27 ENCOUNTER — Encounter: Payer: Self-pay | Admitting: Nurse Practitioner

## 2013-02-27 ENCOUNTER — Encounter (INDEPENDENT_AMBULATORY_CARE_PROVIDER_SITE_OTHER): Payer: Self-pay

## 2013-02-27 VITALS — BP 150/67 | HR 61 | Temp 97.0°F | Ht 63.5 in | Wt 138.0 lb

## 2013-02-27 DIAGNOSIS — I6529 Occlusion and stenosis of unspecified carotid artery: Secondary | ICD-10-CM

## 2013-02-27 DIAGNOSIS — R569 Unspecified convulsions: Secondary | ICD-10-CM

## 2013-02-27 DIAGNOSIS — I6521 Occlusion and stenosis of right carotid artery: Secondary | ICD-10-CM

## 2013-02-27 DIAGNOSIS — I639 Cerebral infarction, unspecified: Secondary | ICD-10-CM

## 2013-02-27 DIAGNOSIS — I6350 Cerebral infarction due to unspecified occlusion or stenosis of unspecified cerebral artery: Secondary | ICD-10-CM

## 2013-02-27 DIAGNOSIS — R413 Other amnesia: Secondary | ICD-10-CM

## 2013-02-27 MED ORDER — LEVETIRACETAM 250 MG PO TABS: 250.0000 mg | ORAL_TABLET | Freq: Two times a day (BID) | ORAL | Status: DC

## 2013-02-27 MED ORDER — LEVETIRACETAM 250 MG PO TABS: ORAL_TABLET | ORAL | Status: DC

## 2013-02-27 NOTE — Progress Notes (Signed)
PATIENT: Anne Hanna DOB: 04/01/1922   REASON FOR VISIT: routine follow up for stroke, seizures HISTORY FROM: patient  HISTORY OF PRESENT ILLNESS: 35 year lady who had a hemorrhagic left medial temporal infarct in December 2013 with post stroke seizures and mild memory loss. Remote history of left occipital and right subinsular infarcts with vascular risk factors of basilar artery stenosis, hypertension, hyperlipidemia and cerebrovascular disease.   08/29/12 (PS):  She is seen for followup today after her last visit on 05/27/12. She continues to do well and has not had any further episodes of memory loss ,confusion ,seizures or strokes. She remains on aspirin which is tolerating well without bleeding, bruising or other side effects. She states that her cholesterol is under good control and was checked by her primary physician but I do not have the results. Her blood pressure today slightly elevated but she states that this usually fine. She plans on having cataract surgery and wants clearance for that. She did not undergo colonoscopy after last visit but he instead underwent CT colonography which was fine. She underwent followup EEG study on 05/28/12 which showed mild by hemispheric diffuse slowing without definite eliptiform activity.  02/27/13 (LL):  Anne Hanna is seen for follow up today for seizures and stroke.  She is doing well.  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  Planning to do an EGD to look for esophageal varices.   She remains on aspirin which is tolerating well without bleeding, bruising or other side effects. She states that her cholesterol is under good control and was checked by her primary physician but I do not have the results.  BP in office today is 150/67, which is higher than it is at home, per daughter.  ROS:  14 system review of systems is positive for right knee pain only  ALLERGIES: Allergies  Allergen Reactions  . Dilantin  [Phenytoin]   . Lipitor [Atorvastatin Calcium]     HEADACHES    HOME MEDICATIONS: Outpatient Prescriptions Prior to Visit  Medication Sig Dispense Refill  . aspirin 81 MG tablet Take 81 mg by mouth daily.      . hydrochlorothiazide 25 MG tablet Take 25 mg by mouth daily.        . nebivolol (BYSTOLIC) 10 MG tablet Take 10 mg by mouth daily.      . nitroGLYCERIN (NITROSTAT) 0.4 MG SL tablet Place 1 tablet (0.4 mg total) under the tongue every 5 (five) minutes as needed.  25 tablet  3  . prednisoLONE acetate (PRED FORTE) 1 % ophthalmic suspension       . rosuvastatin (CRESTOR) 10 MG tablet Take 10 mg by mouth at bedtime.      . Timolol Maleate (ISTALOL) 0.5 % (DAILY) SOLN Place 1 drop into both eyes daily.      Marland Kitchen levETIRAcetam (KEPPRA) 250 MG tablet Take one tablet by mouth twice daily  60 tablet  0   No facility-administered medications prior to visit.    PAST MEDICAL HISTORY: Past Medical History  Diagnosis Date  . Coronary artery disease     STATUS POST CABG  . Hypertension   . Hypercholesterolemia   . CVA (cerebral vascular accident)     OCULAR CVA  . Hx: UTI (urinary tract infection)   . Glaucoma     History of glaucoma  . Bronchopneumonia     Right lung  . Urinary tract infection     Escherichia  coli urinary tract infection  . Acute sinusitis   . Thrombocytopenia 2010     platelet count of 96 at discharge (95 on admission)  . Hyperlipidemia   . History of transient ischemic attack   . Hyponatremia     volume depletion - resolved  . Volume overload     History of postoperative volume overload  . Delirium     Postoperative delirium  . Cataract   . CVA (cerebral infarction)     temporal hemorhage  . Gallstones   . Hepatic cyst     PAST SURGICAL HISTORY: Past Surgical History  Procedure Laterality Date  . Cardiac catheterization  10/05/2006    NORMAL LEFT VENTRICULAR SIZE. THERE IS MODERATE GLOBAL HYPOKINESIA WITH OVERALL EF 40%  . Coronary artery bypass graft   09/2006    LIMA GRAFT TO THE LAD, SAPHENOUS VEIN GRAFT TO THE FIRST OBTUSE MARGINAL VESSEL WITH SEQUENTIAL GRAFT TO THE SECOND MARGINAL VESSEL , AND SAPHENOUS VEIN GRAFT TO THE PDA  . Cataract surgery      Bilateral cataract extraction-- x2  . Tendon repair      Status post repair of tendon of the left index finger    FAMILY HISTORY: Family History  Problem Relation Age of Onset  . Heart failure Mother   . Heart failure Father   . Heart disease Brother     SOCIAL HISTORY: History   Social History  . Marital Status: Widowed    Spouse Name: N/A    Number of Children: 5  . Years of Education: 16   Occupational History  . business office    Social History Main Topics  . Smoking status: Never Smoker   . Smokeless tobacco: Never Used  . Alcohol Use: Yes     Comment: rarely  . Drug Use: No  . Sexual Activity: Not on file   Other Topics Concern  . Not on file   Social History Narrative   Patient is a widow and lives at home alone.   Patient has 5 children.   Patient is retired.   Patient has a college education.   Patient is right-handed.   Patient drinks two cups of coffee every morning.          PHYSICAL EXAM  Filed Vitals:   02/27/13 1332  BP: 150/67  Pulse: 61  Temp: 97 F (36.1 C)  Height: 5' 3.5" (1.613 m)  Weight: 138 lb (62.596 kg)   Body mass index is 24.06 kg/(m^2).  General: frail elderly Caucasian lady in no distress  Head: head normocephalic and atraumatic. Orohparynx benign  Neck: supple with no carotid or supraclavicular bruits  Cardiovascular: regular rate and rhythm, no murmurs  Musculoskeletal: no deformity  Skin: no rash/petichiae  Vascular: Normal pulses all extremities   Neurologic Exam  Mental Status: Awake and fully alert. Oriented to place and time. Mild reduced attention span, concentration and fund of knowledge appropriate. Mood and affect appropriate. Animal Naming 6 only. Recall 1/3 only.  Cranial Nerves: Pupils equal,  briskly reactive to light. Extraocular movements full without nystagmus. Visual fields full show dense right hemianopsia to confrontation. Hearing diminished bilaterally. Facial sensation intact. Face, tongue, palate moves normally and symmetrically.  Motor: Normal bulk and tone. Normal strength in all tested extremity muscles.  Sensory: intact to touch and pinprick. Coordination: Rapid alternating movements normal in all extremities. Finger-to-nose and heel-to-shin performed accurately bilaterally.  Gait and Station: Arises from chair without difficulty. Stance is normal. Gait demonstrates normal  stride length and balance. Able to heel and toe walk with mild difficulty. Tandem unsteady. Romberg negative. Reflexes: 1+ and symmetric. Toes downgoing.   DIAGNOSTIC DATA (LABS, IMAGING, TESTING) - I reviewed patient records, labs, notes, testing and imaging myself where available.  TSH 4.41   ASSESSMENT AND PLAN 77 year lady who at hemorrhagic left medial temporal infarct in December 2013 with post stroke seizures and mild memory loss.  Remote history of left occipital and right subinsular infarcts with vascular risk factors of basilar artery stenosis, hypertension, hyperlipidemia and cerebrovascular disease.  Recently diagnosed with idiopathic liver cirrhosis, mild.  Doing well from a stroke standpoint, no recurrent seizures.  PLAN: Repeat Carotid Doppler study. Continue Keppra for seizure prophylaxis and aspirin for stroke prevention with strict control of hypertension with blood pressure goal below 130/90 and lipids with LDL cholesterol goal below 100 mg percent. I have encouraged her to participate in cognitively challenging activities like reading, solving crossword puzzles, sodoku and playing bridge.  Return for followup in 6 months with Larita Fife, NP   Orders Placed This Encounter  Procedures  . US Carotid Duplex Bilateral   Meds ordered this encounter  Medications  . levETIRAcetam (KEPPRA) 250  MG tablet    Sig: Take one tablet by mouth twice daily    Dispense:  60 tablet    Refill:  5    Order Specific Question:  Supervising Provider    Answer:  Micki Riley [[id number]]  . (KEPPRA) 250 MG tablet  (Rx printed)    Sig: Take 1 tablet (250 mg total) by mouth 2 (two) times daily.    Dispense:  60 tablet    Refill:  11    Order Specific Question: Brand Necessary Supervising Provider    Answer: Janice Norrie, PRAMOD S [[id number]]   Tawny Asal Josafat Enrico, MSN, NP-C 02/27/2013, 2:19 PM [facility] [address], [address] [place], [place] [address] [phone]  Note: This document was prepared with digital dictation and possible smart phrase technology. Any transcriptional errors that result from this process are unintentional.

## 2013-02-27 NOTE — Patient Instructions (Addendum)
PLAN:  We are ordering a repeat carotid doppler study, it has been 1 year since last study.  Continue Keppra for seizure prophylaxis and aspirin for stroke prevention with strict control of hypertension with blood pressure goal below 130/90 and lipids with LDL cholesterol goal below 100 mg percent. I have encouraged her to participate in cognitively challenging activities like reading, solving crossword puzzles, sodoku and playing bridge. Return for followup in 6 months with Larita Fife, NP

## 2013-03-25 ENCOUNTER — Ambulatory Visit (INDEPENDENT_AMBULATORY_CARE_PROVIDER_SITE_OTHER): Payer: Medicare Other

## 2013-03-25 DIAGNOSIS — I6521 Occlusion and stenosis of right carotid artery: Secondary | ICD-10-CM

## 2013-03-25 DIAGNOSIS — I6529 Occlusion and stenosis of unspecified carotid artery: Secondary | ICD-10-CM

## 2013-03-27 ENCOUNTER — Telehealth: Payer: Self-pay | Admitting: Nurse Practitioner

## 2013-03-27 NOTE — Telephone Encounter (Signed)
Spoke with Thayer DallasLinda Norris, patient's daughter, and let her know that her mother's carotid dopplers were negative for significant stenosis. She acknowledged results and had no questions.

## 2013-04-09 ENCOUNTER — Encounter: Payer: Self-pay | Admitting: Cardiology

## 2013-04-09 ENCOUNTER — Ambulatory Visit (INDEPENDENT_AMBULATORY_CARE_PROVIDER_SITE_OTHER): Payer: Medicare Other | Admitting: Cardiology

## 2013-04-09 VITALS — BP 162/64 | HR 68 | Ht 64.0 in | Wt 135.8 lb

## 2013-04-09 DIAGNOSIS — I10 Essential (primary) hypertension: Secondary | ICD-10-CM

## 2013-04-09 DIAGNOSIS — I639 Cerebral infarction, unspecified: Secondary | ICD-10-CM

## 2013-04-09 DIAGNOSIS — I2510 Atherosclerotic heart disease of native coronary artery without angina pectoris: Secondary | ICD-10-CM

## 2013-04-09 DIAGNOSIS — I6350 Cerebral infarction due to unspecified occlusion or stenosis of unspecified cerebral artery: Secondary | ICD-10-CM

## 2013-04-09 DIAGNOSIS — E7800 Pure hypercholesterolemia, unspecified: Secondary | ICD-10-CM

## 2013-04-09 NOTE — Progress Notes (Signed)
Anne Hanna Date of Birth: 28-Jan-1923   History of Present Illness: Anne Hanna is seen today for followup of her coronary disease. Anne Hanna is status post CABG in 2008. Anne Hanna has a history of a hemorrhagic stroke in December 2013 involving the temporal lobe. Anne Hanna has done well from a cardiac standpoint without any recurrent symptoms of chest pain, shortness of breath, or palpitations. Anne Hanna is on Keppra for seizure prophylaxis. Anne Hanna did have a urinary tract infection during her hospital stay that grew strep bovis. Subsequent CT of the abdomen showed no evidence of colon mass. Anne Hanna reports Anne Hanna does have mild cirrhosis and is scheduled for EGD next week by Dr. Dulce Sellar. Anne Hanna does complain of bursitis in right knee.   Current Outpatient Prescriptions on File Prior to Visit  Medication Sig Dispense Refill  . aspirin 81 MG tablet Take 81 mg by mouth daily.      . hydrochlorothiazide 25 MG tablet Take 25 mg by mouth daily.        Marland Kitchen levETIRAcetam (KEPPRA) 250 MG tablet Take 1 tablet (250 mg total) by mouth 2 (two) times daily.  60 tablet  11  . nebivolol (BYSTOLIC) 10 MG tablet Take 10 mg by mouth daily.      . nitroGLYCERIN (NITROSTAT) 0.4 MG SL tablet Place 1 tablet (0.4 mg total) under the tongue every 5 (five) minutes as needed.  25 tablet  3  . omeprazole (PRILOSEC) 20 MG capsule Take 20 mg by mouth daily.      . rosuvastatin (CRESTOR) 10 MG tablet Take 10 mg by mouth at bedtime.      . Timolol Maleate (ISTALOL) 0.5 % (DAILY) SOLN Place 1 drop into both eyes daily.       No current facility-administered medications on file prior to visit.    Allergies  Allergen Reactions  . Dilantin [Phenytoin]   . Lipitor [Atorvastatin Calcium]     HEADACHES    Past Medical History  Diagnosis Date  . Coronary artery disease     STATUS POST CABG  . Hypertension   . Hypercholesterolemia   . CVA (cerebral vascular accident)     OCULAR CVA  . Hx: UTI (urinary tract infection)   . Glaucoma     History of  glaucoma  . Bronchopneumonia     Right lung  . Urinary tract infection     Escherichia coli urinary tract infection  . Acute sinusitis   . Thrombocytopenia 2010     platelet count of 96 at discharge (95 on admission)  . Hyperlipidemia   . History of transient ischemic attack   . Hyponatremia     volume depletion - resolved  . Volume overload     History of postoperative volume overload  . Delirium     Postoperative delirium  . Cataract   . CVA (cerebral infarction)     temporal hemorhage  . Gallstones   . Hepatic cyst     Past Surgical History  Procedure Laterality Date  . Cardiac catheterization  10/05/2006    NORMAL LEFT VENTRICULAR SIZE. THERE IS MODERATE GLOBAL HYPOKINESIA WITH OVERALL EF 40%  . Coronary artery bypass graft  09/2006    LIMA GRAFT TO THE LAD, SAPHENOUS VEIN GRAFT TO THE FIRST OBTUSE MARGINAL VESSEL WITH SEQUENTIAL GRAFT TO THE SECOND MARGINAL VESSEL , AND SAPHENOUS VEIN GRAFT TO THE PDA  . Cataract surgery      Bilateral cataract extraction-- x2  . Tendon repair  Status post repair of tendon of the left index finger    History  Smoking status  . Never Smoker   Smokeless tobacco  . Never Used    History  Alcohol Use  . Yes    Comment: rarely    Family History  Problem Relation Age of Onset  . Heart failure Mother   . Heart failure Father   . Heart disease Brother     Review of Systems:  As noted in HPI. All other systems were reviewed and are negative.  Physical Exam: BP 162/64  Pulse 68  Ht 5\' 4"  (1.626 m)  Wt 135 lb 12.8 oz (61.598 kg)  BMI 23.30 kg/m2 Anne Hanna is a pleasant white female in no acute distress. HEENT exam is unremarkable. Pupils equal round and reactive, sclera clear. Oropharynx is clear and tongue is midline. Anne Hanna has no JVD or bruits. Lungs are clear. Cardiac exam reveals a regular rate and rhythm without gallop, murmur, or click. Abdomen is soft nontender without mass. Bowel sounds positive. Anne Hanna has no edema. Pedal  pulses are good. Her right knee does not appear swollen or tender on exam.Anne Hanna is alert and oriented x3. Cranial nerves II through XII are intact. No focal findings.   LABORATORY DATA:  Reviewed from primary care 01/23/13--A1c- 5.4%. Hgb 10.5, plts 117K, Bun 27, creatinine 1.05. Chol 144, trig- 76, HDL 73, LDL 56.   Assessment / Plan: 1. Hemorrhagic CVA involving the temporal lobe. Clinically Anne Hanna has made an excellent recovery. Continue baby aspirin.  2.Seizure.  on Keppra.  3. Strep bovis UTI. Resolved  4. Hypertension  5. Coronary disease status post CABG in 2008. No significant anginal symptoms.  6. Hyperlipidemia.  I instructed her to take Tylenol prn for analgesia, avoid NSAIDs. Continue her Statin. I will follow up in 6 months.

## 2013-04-09 NOTE — Patient Instructions (Signed)
Take Tylenol prn for knee pain.  Continue your other therapy  I will see you in 6 months.

## 2013-04-17 ENCOUNTER — Other Ambulatory Visit: Payer: Self-pay | Admitting: Cardiology

## 2013-04-17 ENCOUNTER — Ambulatory Visit: Payer: Medicare Other | Admitting: Cardiology

## 2013-04-21 ENCOUNTER — Other Ambulatory Visit: Payer: Self-pay | Admitting: Gastroenterology

## 2013-04-21 NOTE — Addendum Note (Signed)
Addended by: Elleanna Melling on: 04/21/2013 02:59 PM   Modules accepted: Orders  

## 2013-04-22 ENCOUNTER — Encounter (HOSPITAL_COMMUNITY): Payer: Self-pay | Admitting: Gastroenterology

## 2013-04-22 ENCOUNTER — Ambulatory Visit (HOSPITAL_COMMUNITY)
Admission: RE | Admit: 2013-04-22 | Discharge: 2013-04-22 | Disposition: A | Discharge status: Home / Self Care | Payer: Medicare Other | Source: Ambulatory Visit | Attending: Gastroenterology | Admitting: Gastroenterology

## 2013-04-22 ENCOUNTER — Encounter (HOSPITAL_COMMUNITY)
Admission: RE | Disposition: A | Discharge status: Home / Self Care | Payer: Self-pay | Source: Ambulatory Visit | Attending: Gastroenterology

## 2013-04-22 DIAGNOSIS — I10 Essential (primary) hypertension: Secondary | ICD-10-CM | POA: Insufficient documentation

## 2013-04-22 DIAGNOSIS — I2510 Atherosclerotic heart disease of native coronary artery without angina pectoris: Secondary | ICD-10-CM | POA: Insufficient documentation

## 2013-04-22 DIAGNOSIS — Z7982 Long term (current) use of aspirin: Secondary | ICD-10-CM | POA: Insufficient documentation

## 2013-04-22 DIAGNOSIS — G40909 Epilepsy, unspecified, not intractable, without status epilepticus: Secondary | ICD-10-CM | POA: Insufficient documentation

## 2013-04-22 DIAGNOSIS — Z951 Presence of aortocoronary bypass graft: Secondary | ICD-10-CM | POA: Insufficient documentation

## 2013-04-22 DIAGNOSIS — E785 Hyperlipidemia, unspecified: Secondary | ICD-10-CM | POA: Insufficient documentation

## 2013-04-22 DIAGNOSIS — E7800 Pure hypercholesterolemia, unspecified: Secondary | ICD-10-CM | POA: Insufficient documentation

## 2013-04-22 DIAGNOSIS — I8510 Secondary esophageal varices without bleeding: Secondary | ICD-10-CM | POA: Insufficient documentation

## 2013-04-22 DIAGNOSIS — K7460 Unspecified cirrhosis of liver: Secondary | ICD-10-CM | POA: Insufficient documentation

## 2013-04-22 DIAGNOSIS — Z8673 Personal history of transient ischemic attack (TIA), and cerebral infarction without residual deficits: Secondary | ICD-10-CM | POA: Insufficient documentation

## 2013-04-22 HISTORY — PX: ESOPHAGOGASTRODUODENOSCOPY: SHX5428

## 2013-04-22 SURGERY — EGD (ESOPHAGOGASTRODUODENOSCOPY)
Anesthesia: Moderate Sedation

## 2013-04-22 MED ORDER — BUTAMBEN-TETRACAINE-BENZOCAINE 2-2-14 % EX AERO
INHALATION_SPRAY | CUTANEOUS | Status: DC | PRN
  Administered 2013-04-22: 2 via TOPICAL

## 2013-04-22 MED ORDER — MIDAZOLAM HCL 10 MG/2ML IJ SOLN
INTRAMUSCULAR | Status: DC | PRN
  Administered 2013-04-22 (×2): 1 mg via INTRAVENOUS

## 2013-04-22 MED ORDER — MIDAZOLAM HCL 5 MG/ML IJ SOLN
INTRAMUSCULAR | Status: AC
  Filled 2013-04-22: qty 2

## 2013-04-22 MED ORDER — FENTANYL CITRATE 0.05 MG/ML IJ SOLN
INTRAMUSCULAR | Status: AC
  Filled 2013-04-22: qty 2

## 2013-04-22 MED ORDER — FENTANYL CITRATE 0.05 MG/ML IJ SOLN
INTRAMUSCULAR | Status: DC | PRN
  Administered 2013-04-22: 25 ug via INTRAVENOUS

## 2013-04-22 MED ORDER — SODIUM CHLORIDE 0.9 % IV SOLN
INTRAVENOUS | Status: DC
  Administered 2013-04-22: 500 mL via INTRAVENOUS

## 2013-04-22 NOTE — Op Note (Signed)
[facility] [address] [place], [id number]   ENDOSCOPY PROCEDURE REPORT  PATIENT: Anne Hanna, Anne F.  MR#: [id number] BIRTHDATE: 1922-12-11 , 89  yrs. old GENDER: Female ENDOSCOPIST: Willis ModenaWilliam Ashyla Luth, MD REFERRED BY:  Lupita RaiderKimberlee Shaw, M.D. PROCEDURE DATE:  04/22/2013 PROCEDURE:  EGD, screening ASA CLASS:     Class III INDICATIONS:  cirrhosis, variceal screening. MEDICATIONS: Fentanyl 25 mcg IV and Versed 2 mg IV TOPICAL ANESTHETIC: Cetacaine Spray  DESCRIPTION OF PROCEDURE: After the risks benefits and alternatives of the procedure were thoroughly explained, informed consent was obtained.  The Pentax Gastroscope [id number] endoscope was introduced through the mouth and advanced to the second portion of the duodenum. Without limitations.  The instrument was slowly withdrawn as the mucosa was fully examined.     Findings:  Normal esophagus; no esophageal varices were seen.  Small cluster of gastric varices seen in the fundus upon retroflexion. Stomach and pylorus otherwise normal.  No portal gastropathy seen. Normal duodenum to the second portion.            The scope was then withdrawn from the patient and the procedure completed.  ENDOSCOPIC IMPRESSION:     As above.  Small cluster of gastric varices.  RECOMMENDATIONS:     1.  Watch for potential complications of procedure. 2.  Gastric varices are small, wouldn't add low dose non-specific betablocker (she is already on bystolic)  or nitrate based on today's appearance. 3.  Follow-up with [facility] in 3-4 months for ongoing management of cirrhosis.  eSigned:  Willis ModenaWilliam Gillermo Poch, MD 04/22/2013 9:29 AM   CC:

## 2013-04-22 NOTE — Discharge Instructions (Signed)
Endoscopy Care After Please read the instructions outlined below and refer to this sheet in the next few weeks. These discharge instructions provide you with general information on caring for yourself after you leave the hospital. Your doctor may also give you specific instructions. While your treatment has been planned according to the most current medical practices available, unavoidable complications occasionally occur. If you have any problems or questions after discharge, please call Dr. Dulce Sellarutlaw [facility]) at [phone].  HOME CARE INSTRUCTIONS Activity  You may resume your regular activity but move at a slower pace for the next 24 hours.   Take frequent rest periods for the next 24 hours.   Walking will help expel (get rid of) the air and reduce the bloated feeling in your abdomen.   No driving for 24 hours (because of the anesthesia (medicine) used during the test).   You may shower.   Do not sign any important legal documents or operate any machinery for 24 hours (because of the anesthesia used during the test).  Nutrition  Drink plenty of fluids.   You may resume your normal diet.   Begin with a light meal and progress to your normal diet.   Avoid alcoholic beverages for 24 hours or as instructed by your caregiver.  Medications You may resume your normal medications unless your caregiver tells you otherwise. What you can expect today  You may experience abdominal discomfort such as a feeling of fullness or "gas" pains.   You may experience a sore throat for 2 to 3 days. This is normal. Gargling with salt water may help this.    SEEK IMMEDIATE MEDICAL CARE IF:  You have excessive nausea (feeling sick to your stomach) and/or vomiting.   You have severe abdominal pain and distention (swelling).   You have trouble swallowing.   You have a temperature over 100 F (37.8 C).   You have rectal bleeding or vomiting of blood.  Document Released:  10/19/2003 Document Revised: 11/16/2010 Document Reviewed: 05/01/2007 [facility] Patient Information 2012 [place], [place].Esophagogastroduodenoscopy Care After Refer to this sheet in the next few weeks. These instructions provide you with information on caring for yourself after your procedure. Your caregiver may also give you more specific instructions. Your treatment has been planned according to current medical practices, but problems sometimes occur. Call your caregiver if you have any problems or questions after your procedure.  HOME CARE INSTRUCTIONS  Do not eat or drink anything until the numbing medicine (local anesthetic) has worn off and your gag reflex has returned. You will know that the local anesthetic has worn off when you can swallow comfortably.  Do not drive for 12 hours after the procedure or as directed by your caregiver.  Only take medicines as directed by your caregiver. SEEK MEDICAL CARE IF:   You cannot stop coughing.  You are not urinating at all or less than usual. SEEK IMMEDIATE MEDICAL CARE IF:  You have difficulty swallowing.  You cannot eat or drink.  You have worsening throat or chest pain.  You have dizziness, lightheadedness, or you faint.  You have nausea or vomiting.  You have chills.  You have a fever.  You have severe abdominal pain.  You have black, tarry, or bloody stools. Document Released: 02/21/2012 Document Reviewed: 02/21/2012 [facility] Patient Information 2014 [place], [place].

## 2013-04-22 NOTE — H&P (View-Only) (Signed)
Anne Hanna Date of Birth: 28-Jan-1923   History of Present Illness: Anne Hanna is seen today for followup of her coronary disease. She is status post CABG in 2008. She has a history of a hemorrhagic stroke in December 2013 involving the temporal lobe. She has done well from a cardiac standpoint without any recurrent symptoms of chest pain, shortness of breath, or palpitations. She is on Keppra for seizure prophylaxis. She did have a urinary tract infection during her hospital stay that grew strep bovis. Subsequent CT of the abdomen showed no evidence of colon mass. She reports she does have mild cirrhosis and is scheduled for EGD next week by Anne Hanna. She does complain of bursitis in right knee.   Current Outpatient Prescriptions on File Prior to Visit  Medication Sig Dispense Refill  . aspirin 81 MG tablet Take 81 mg by mouth daily.      . hydrochlorothiazide 25 MG tablet Take 25 mg by mouth daily.        Marland Kitchen levETIRAcetam (KEPPRA) 250 MG tablet Take 1 tablet (250 mg total) by mouth 2 (two) times daily.  60 tablet  11  . nebivolol (BYSTOLIC) 10 MG tablet Take 10 mg by mouth daily.      . nitroGLYCERIN (NITROSTAT) 0.4 MG SL tablet Place 1 tablet (0.4 mg total) under the tongue every 5 (five) minutes as needed.  25 tablet  3  . omeprazole (PRILOSEC) 20 MG capsule Take 20 mg by mouth daily.      . rosuvastatin (CRESTOR) 10 MG tablet Take 10 mg by mouth at bedtime.      . Timolol Maleate (ISTALOL) 0.5 % (DAILY) SOLN Place 1 drop into both eyes daily.       No current facility-administered medications on file prior to visit.    Allergies  Allergen Reactions  . Dilantin [Phenytoin]   . Lipitor [Atorvastatin Calcium]     HEADACHES    Past Medical History  Diagnosis Date  . Coronary artery disease     STATUS POST CABG  . Hypertension   . Hypercholesterolemia   . CVA (cerebral vascular accident)     OCULAR CVA  . Hx: UTI (urinary tract infection)   . Glaucoma     History of  glaucoma  . Bronchopneumonia     Right lung  . Urinary tract infection     Escherichia coli urinary tract infection  . Acute sinusitis   . Thrombocytopenia 2010     platelet count of 96 at discharge (95 on admission)  . Hyperlipidemia   . History of transient ischemic attack   . Hyponatremia     volume depletion - resolved  . Volume overload     History of postoperative volume overload  . Delirium     Postoperative delirium  . Cataract   . CVA (cerebral infarction)     temporal hemorhage  . Gallstones   . Hepatic cyst     Past Surgical History  Procedure Laterality Date  . Cardiac catheterization  10/05/2006    NORMAL LEFT VENTRICULAR SIZE. THERE IS MODERATE GLOBAL HYPOKINESIA WITH OVERALL EF 40%  . Coronary artery bypass graft  09/2006    LIMA GRAFT TO THE LAD, SAPHENOUS VEIN GRAFT TO THE FIRST OBTUSE MARGINAL VESSEL WITH SEQUENTIAL GRAFT TO THE SECOND MARGINAL VESSEL , AND SAPHENOUS VEIN GRAFT TO THE PDA  . Cataract surgery      Bilateral cataract extraction-- x2  . Tendon repair  Status post repair of tendon of the left index finger    History  Smoking status  . Never Smoker   Smokeless tobacco  . Never Used    History  Alcohol Use  . Yes    Comment: rarely    Family History  Problem Relation Age of Onset  . Heart failure Mother   . Heart failure Father   . Heart disease Brother     Review of Systems:  As noted in HPI. All other systems were reviewed and are negative.  Physical Exam: BP 162/64  Pulse 68  Ht 5\' 4"  (1.626 m)  Wt 135 lb 12.8 oz (61.598 kg)  BMI 23.30 kg/m2 She is a pleasant white female in no acute distress. HEENT exam is unremarkable. Pupils equal round and reactive, sclera clear. Oropharynx is clear and tongue is midline. She has no JVD or bruits. Lungs are clear. Cardiac exam reveals a regular rate and rhythm without gallop, murmur, or click. Abdomen is soft nontender without mass. Bowel sounds positive. She has no edema. Pedal  pulses are good. Her right knee does not appear swollen or tender on exam.She is alert and oriented x3. Cranial nerves II through XII are intact. No focal findings.   LABORATORY DATA:  Reviewed from primary care 01/23/13--A1c- 5.4%. Hgb 10.5, plts 117K, Bun 27, creatinine 1.05. Chol 144, trig- 76, HDL 73, LDL 56.   Assessment / Plan: 1. Hemorrhagic CVA involving the temporal lobe. Clinically she has made an excellent recovery. Continue baby aspirin.  2.Seizure.  on Keppra.  3. Strep bovis UTI. Resolved  4. Hypertension  5. Coronary disease status post CABG in 2008. No significant anginal symptoms.  6. Hyperlipidemia.  I instructed her to take Tylenol prn for analgesia, avoid NSAIDs. Continue her Statin. I will follow up in 6 months.

## 2013-04-22 NOTE — Interval H&P Note (Signed)
History and Physical Interval Note:  04/22/2013 9:00 AM  Wynelle BeckmannMary F Sanker  has presented today for surgery, with the diagnosis of cirrh.-varricies screening  The various methods of treatment have been discussed with the patient and family. After consideration of risks, benefits and other options for treatment, the patient has consented to  Procedure(s): ESOPHAGOGASTRODUODENOSCOPY (EGD) (N/A) as a surgical intervention .  The patient's history has been reviewed, patient examined, no change in status, stable for surgery.  I have reviewed the patient's chart and labs.  Questions were answered to the patient's satisfaction.     Freddy JakschUTLAW,Anyiah Coverdale M  Assessment  1.  Cirrhosis. 2.  Multiple medical problems.  Plan:  1.  Endoscopy to screen for gastroesophageal varices. 2.  Risks (bleeding, infection, bowel perforation that could require surgery, sedation-related changes in cardiopulmonary systems), benefits (identification and possible treatment of source of symptoms, exclusion of certain causes of symptoms), and alternatives (watchful waiting, radiographic imaging studies, empiric medical treatment) of upper endoscopy (EGD) were explained to patient/family in detail and patient wishes to proceed.

## 2013-04-23 ENCOUNTER — Encounter (HOSPITAL_COMMUNITY): Payer: Self-pay | Admitting: Gastroenterology

## 2013-08-28 ENCOUNTER — Encounter: Payer: Self-pay | Admitting: Nurse Practitioner

## 2013-08-28 ENCOUNTER — Ambulatory Visit (INDEPENDENT_AMBULATORY_CARE_PROVIDER_SITE_OTHER): Payer: Medicare Other | Admitting: Nurse Practitioner

## 2013-08-28 ENCOUNTER — Encounter (INDEPENDENT_AMBULATORY_CARE_PROVIDER_SITE_OTHER): Payer: Self-pay

## 2013-08-28 VITALS — BP 133/65 | HR 59 | Ht 64.0 in | Wt 136.0 lb

## 2013-08-28 DIAGNOSIS — R413 Other amnesia: Secondary | ICD-10-CM

## 2013-08-28 DIAGNOSIS — I639 Cerebral infarction, unspecified: Secondary | ICD-10-CM

## 2013-08-28 DIAGNOSIS — R569 Unspecified convulsions: Secondary | ICD-10-CM

## 2013-08-28 DIAGNOSIS — I6350 Cerebral infarction due to unspecified occlusion or stenosis of unspecified cerebral artery: Secondary | ICD-10-CM

## 2013-08-28 DIAGNOSIS — I2510 Atherosclerotic heart disease of native coronary artery without angina pectoris: Secondary | ICD-10-CM

## 2013-08-28 NOTE — Patient Instructions (Signed)
Continue Keppra for seizure prophylaxis and aspirin for stroke prevention with strict control of hypertension with blood pressure goal below 140/90 and lipids with LDL cholesterol goal below 100 mg percent. I have encouraged her to participate in cognitively challenging activities like reading, solving crossword puzzles, sodoku and playing bridge.  Return for followup in 6 months, sooner as needed.

## 2013-08-28 NOTE — Progress Notes (Signed)
PATIENT: Anne BeckmannMary F Hanna DOB: 11/23/1922  REASON FOR VISIT: routine follow up for stroke and seizures HISTORY FROM: patient  HISTORY OF PRESENT ILLNESS: [age] year lady who had a hemorrhagic left medial temporal infarct in December 2013 with post stroke seizures and mild memory loss. Remote history of left occipital and right subinsular infarcts with vascular risk factors of basilar artery stenosis, hypertension, hyperlipidemia and cerebrovascular disease.   08/29/12 (PS): She is seen for followup today after her last visit on 05/27/12. She continues to do well and has not had any further episodes of memory loss ,confusion ,seizures or strokes. She remains on aspirin which is tolerating well without bleeding, bruising or other side effects. She states that her cholesterol is under good control and was checked by her primary physician but I do not have the results. Her blood pressure today slightly elevated but she states that this usually fine. She plans on having cataract surgery and wants clearance for that. She did not undergo colonoscopy after last visit but he instead underwent CT colonography which was fine. She underwent followup EEG study on 05/28/12 which showed mild by hemispheric diffuse slowing without definite eliptiform activity.  02/27/13 (LL): [name] is seen for follow up today for seizures and stroke. She is doing well. 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 Planning to do an EGD to look for esophageal varices.  She remains on aspirin which is tolerating well without bleeding, bruising or other side effects. She states that her cholesterol is under good control and was checked by her primary physician but I do not have the results. BP in office today is 150/67, which is higher than it is at home, per daughter.   08/28/13 (LL):  Since last visit she has been well, no falls or illnesses.  EGD was reportedly normal.  She states that blood pressure is  under good control, in the office today is 133/65.  She has not had any seizures.  She is tolerating aspirin well with no signs of significant bleeding or bruising.  ROS:  14 system review of systems is positive for right knee pain only.  ALLERGIES: Allergies  Allergen Reactions  . Dilantin [Phenytoin]   . Amlodipine Swelling  . Lipitor [Atorvastatin Calcium]     HEADACHES    HOME MEDICATIONS: Outpatient Prescriptions Prior to Visit  Medication Sig Dispense Refill  . aspirin 81 MG tablet Take 81 mg by mouth daily.      . CRESTOR 10 MG tablet Take one tablet by mouth at bedtime  30 tablet  5  . hydrochlorothiazide 25 MG tablet Take 25 mg by mouth daily.        Marland Kitchen. levETIRAcetam (KEPPRA) 250 MG tablet Take 1 tablet (250 mg total) by mouth 2 (two) times daily.  60 tablet  11  . nebivolol (BYSTOLIC) 10 MG tablet Take 10 mg by mouth daily.      . nitroGLYCERIN (NITROSTAT) 0.4 MG SL tablet Place 1 tablet (0.4 mg total) under the tongue every 5 (five) minutes as needed.  25 tablet  3  . omeprazole (PRILOSEC) 20 MG capsule Take 20 mg by mouth daily.      . Timolol Maleate (ISTALOL) 0.5 % (DAILY) SOLN Place 1 drop into both eyes daily.       No facility-administered medications prior to visit.    PHYSICAL EXAM  Filed Vitals:   08/28/13 1002  BP: 133/65  Pulse: 59  Height: 5\' 4"  (1.626 m)  Weight: 136 lb (61.689 kg)   Body mass index is 23.33 kg/(m^2). No exam data present  General: frail elderly Caucasian lady in no distress  Head: head normocephalic and atraumatic. Orohparynx benign  Neck: supple with no carotid or supraclavicular bruits  Cardiovascular: regular rate and rhythm, no murmurs  Musculoskeletal: no deformity  Skin: no rash/petichiae   Neurologic Exam  Mental Status: Awake and fully alert. Oriented to place and time. Mild reduced attention span, concentration and fund of knowledge appropriate. Mood and affect appropriate. Animal Naming 7 only. Recall 2/3 only.    Cranial Nerves: Pupils equal, briskly reactive to light. Extraocular movements full without nystagmus. Visual fields full show dense right hemianopsia to confrontation. Hearing diminished bilaterally. Facial sensation intact. Face, tongue, palate moves normally and symmetrically.  Motor: Normal bulk and tone. Normal strength in all tested extremity muscles.  Sensory: intact to touch and pinprick in all 4 extremities Coordination: Rapid alternating movements normal in all extremities. Finger-to-nose and heel-to-shin performed accurately bilaterally.  Gait and Station: Arises from chair without difficulty. Stance is normal. Gait demonstrates normal stride length and balance. Tandem unsteady. Romberg negative.  Reflexes: 1+ and symmetric.    ASSESSMENT AND PLAN 11 year lady who at hemorrhagic left medial temporal infarct in December 2013 with post stroke seizures and mild memory loss. Remote history of left occipital and right subinsular infarcts with vascular risk factors of basilar artery stenosis, hypertension, hyperlipidemia and cerebrovascular disease. Recently diagnosed with idiopathic liver cirrhosis, mild. Doing well from a stroke standpoint, no recurrent seizures.   PLAN:  Continue Keppra for seizure prophylaxis and aspirin for stroke prevention with strict control of hypertension with blood pressure goal below 140/90 and lipids with LDL cholesterol goal below 100 mg percent. I have encouraged her to participate in cognitively challenging activities like reading, solving crossword puzzles, sodoku and playing bridge.  Return for followup in 6 months, sooner as needed.  Ronal Fear, MSN, NP-C 08/28/2013, 10:38 AM [facility] [address], [address] [place], [place] [address] [phone]  Note: This document was prepared with digital dictation and possible smart phrase technology. Any transcriptional errors that result from this process are unintentional.

## 2013-09-01 NOTE — Progress Notes (Signed)
I agree with above 

## 2013-09-18 ENCOUNTER — Other Ambulatory Visit: Payer: Self-pay | Admitting: Nurse Practitioner

## 2013-10-07 ENCOUNTER — Encounter: Payer: Self-pay | Admitting: Cardiology

## 2013-10-07 ENCOUNTER — Ambulatory Visit (INDEPENDENT_AMBULATORY_CARE_PROVIDER_SITE_OTHER): Payer: Medicare Other | Admitting: Cardiology

## 2013-10-07 VITALS — BP 115/60 | HR 73 | Ht 65.0 in | Wt 137.1 lb

## 2013-10-07 DIAGNOSIS — I2510 Atherosclerotic heart disease of native coronary artery without angina pectoris: Secondary | ICD-10-CM

## 2013-10-07 DIAGNOSIS — E7800 Pure hypercholesterolemia, unspecified: Secondary | ICD-10-CM

## 2013-10-07 DIAGNOSIS — I10 Essential (primary) hypertension: Secondary | ICD-10-CM

## 2013-10-07 NOTE — Progress Notes (Signed)
Anne Hanna Date of Birth: [birth year]   History of Present Illness: Anne Hanna is seen today for followup of her coronary disease. She is status post CABG in 2008. She has a history of a hemorrhagic stroke in December 2013 involving the temporal lobe. She has done well from a cardiac standpoint without any recurrent symptoms of chest pain, shortness of breath, or palpitations. She is on Keppra for seizure prophylaxis. She states she doesn't remember anything for 2 months after her stroke but feels she has now made a complete recovery.    Current Outpatient Prescriptions on File Prior to Visit  Medication Sig Dispense Refill  . aspirin 81 MG tablet Take 81 mg by mouth daily.      . CRESTOR 10 MG tablet Take one tablet by mouth at bedtime  30 tablet  5  . hydrochlorothiazide 25 MG tablet Take 25 mg by mouth daily.        Marland Kitchen levETIRAcetam (KEPPRA) 250 MG tablet TAKE ONE TABLET BY MOUTH TWICE DAILY   60 tablet  6  . nebivolol (BYSTOLIC) 10 MG tablet Take 10 mg by mouth daily.      . nitroGLYCERIN (NITROSTAT) 0.4 MG SL tablet Place 1 tablet (0.4 mg total) under the tongue every 5 (five) minutes as needed.  25 tablet  3  . Timolol Maleate (ISTALOL) 0.5 % (DAILY) SOLN Place 1 drop into both eyes daily.       No current facility-administered medications on file prior to visit.    Allergies  Allergen Reactions  . Dilantin [Phenytoin]   . Amlodipine Swelling  . Lipitor [Atorvastatin Calcium]     HEADACHES    Past Medical History  Diagnosis Date  . Coronary artery disease     STATUS POST CABG  . Hypertension   . Hypercholesterolemia   . CVA (cerebral vascular accident)     OCULAR CVA  . Hx: UTI (urinary tract infection)   . Glaucoma     History of glaucoma  . Bronchopneumonia     Right lung  . Urinary tract infection     Escherichia coli urinary tract infection  . Acute sinusitis   . Thrombocytopenia 2010     platelet count of 96 at discharge (95 on admission)  .  Hyperlipidemia   . History of transient ischemic attack   . Hyponatremia     volume depletion - resolved  . Volume overload     History of postoperative volume overload  . Delirium     Postoperative delirium  . Cataract   . CVA (cerebral infarction)     temporal hemorhage  . Gallstones   . Hepatic cyst     Past Surgical History  Procedure Laterality Date  . Cardiac catheterization  10/05/2006    NORMAL LEFT VENTRICULAR SIZE. THERE IS MODERATE GLOBAL HYPOKINESIA WITH OVERALL EF 40%  . Coronary artery bypass graft  09/2006    LIMA GRAFT TO THE LAD, SAPHENOUS VEIN GRAFT TO THE FIRST OBTUSE MARGINAL VESSEL WITH SEQUENTIAL GRAFT TO THE SECOND MARGINAL VESSEL , AND SAPHENOUS VEIN GRAFT TO THE PDA  . Cataract surgery      Bilateral cataract extraction-- x2  . Tendon repair      Status post repair of tendon of the left index finger  . Esophagogastroduodenoscopy N/A 04/22/2013    Procedure: ESOPHAGOGASTRODUODENOSCOPY (EGD);  Surgeon: Willis Modena, MD;  Location: [facility] ENDOSCOPY;  Service: Endoscopy;  Laterality: N/A;    History  Smoking status  . Never  Smoker   Smokeless tobacco  . Never Used    History  Alcohol Use  . Yes    Comment: rarely    Family History  Problem Relation Age of Onset  . Heart failure Mother   . Heart failure Father   . Heart disease Brother     Review of Systems:  As noted in HPI. All other systems were reviewed and are negative.  Physical Exam: BP 115/60  Pulse 73  Ht 5\' 5"  (1.651 m)  Wt 137 lb 1.6 oz (62.188 kg)  BMI 22.81 kg/m2 She is a pleasant white female in no acute distress. HEENT exam is unremarkable. Pupils equal round and reactive, sclera clear. Oropharynx is clear and tongue is midline. She has no JVD or bruits. Lungs are clear. Cardiac exam reveals a regular rate and rhythm without gallop, murmur, or click. Abdomen is soft nontender without mass. Bowel sounds positive. She has no edema. Pedal pulses are good. Her right knee does not  appear swollen or tender on exam.She is alert and oriented x3. Cranial nerves II through XII are intact. No focal findings.   LABORATORY DATA:  Ecg: NSR with occ PVC. Otherwise normal.   Assessment / Plan: 1. Hemorrhagic CVA involving the temporal lobe. Clinically she has made an excellent recovery. Continue baby aspirin.  2. Seizure.  on Keppra.  3.  Hypertension  4. Coronary disease status post CABG in 2008. No significant anginal symptoms.  5. Hyperlipidemia.  I will follow up in 6 months. Patient reports lab work done by primary care.

## 2013-10-07 NOTE — Patient Instructions (Signed)
Continue your current therapy  I will see you in 6 months.   

## 2013-10-08 ENCOUNTER — Other Ambulatory Visit: Payer: Self-pay

## 2013-10-08 DIAGNOSIS — I10 Essential (primary) hypertension: Secondary | ICD-10-CM

## 2013-10-08 DIAGNOSIS — I639 Cerebral infarction, unspecified: Secondary | ICD-10-CM

## 2013-10-18 ENCOUNTER — Other Ambulatory Visit: Payer: Self-pay | Admitting: Cardiology

## 2013-10-21 ENCOUNTER — Telehealth: Payer: Self-pay | Admitting: Neurology

## 2013-10-21 ENCOUNTER — Encounter: Payer: Self-pay | Admitting: Neurology

## 2013-10-21 NOTE — Telephone Encounter (Signed)
Spoke to patient's daughter regarding rescheduling 03/10/14 appointment per Dr. Marlis EdelsonSethi's schedule, daughter verbalized understanding, printed and mailed letter with new appointment time.

## 2014-03-10 ENCOUNTER — Ambulatory Visit: Payer: Medicare Other | Admitting: Neurology

## 2014-03-21 ENCOUNTER — Other Ambulatory Visit: Payer: Self-pay | Admitting: Internal Medicine

## 2014-04-07 ENCOUNTER — Encounter: Payer: Self-pay | Admitting: Cardiology

## 2014-04-07 ENCOUNTER — Ambulatory Visit (INDEPENDENT_AMBULATORY_CARE_PROVIDER_SITE_OTHER): Payer: Medicare Other | Admitting: Cardiology

## 2014-04-07 VITALS — BP 120/60 | HR 78 | Ht 65.0 in | Wt 130.9 lb

## 2014-04-07 DIAGNOSIS — E7800 Pure hypercholesterolemia, unspecified: Secondary | ICD-10-CM

## 2014-04-07 DIAGNOSIS — I10 Essential (primary) hypertension: Secondary | ICD-10-CM

## 2014-04-07 DIAGNOSIS — I2510 Atherosclerotic heart disease of native coronary artery without angina pectoris: Secondary | ICD-10-CM

## 2014-04-07 NOTE — Progress Notes (Signed)
Anne Hanna Date of Birth: 02-13-1923   History of Present Illness: Anne Hanna is seen today for followup of her coronary disease. She is status post CABG in 2008. She has a history of a hemorrhagic stroke in December 2013 involving the temporal lobe. She has done well from a cardiac standpoint without any recurrent symptoms of chest pain, shortness of breath, or palpitations. Her only complaint is that she doesn't have much of an appetite. She has lost 6 lbs.    Current Outpatient Prescriptions on File Prior to Visit  Medication Sig Dispense Refill  . aspirin 81 MG tablet Take 81 mg by mouth daily.    . CRESTOR 10 MG tablet TAKE ONE TABLET BY MOUTH AT BEDTIME  30 tablet 3  . hydrochlorothiazide 25 MG tablet Take 25 mg by mouth daily.      Marland Kitchen levETIRAcetam (KEPPRA) 250 MG tablet TAKE ONE TABLET BY MOUTH TWICE DAILY  60 tablet 6  . nebivolol (BYSTOLIC) 10 MG tablet Take 10 mg by mouth daily.    . nitroGLYCERIN (NITROSTAT) 0.4 MG SL tablet Place 1 tablet (0.4 mg total) under the tongue every 5 (five) minutes as needed. 25 tablet 3  . Timolol Maleate (ISTALOL) 0.5 % (DAILY) SOLN Place 1 drop into both eyes daily.     No current facility-administered medications on file prior to visit.    Allergies  Allergen Reactions  . Dilantin [Phenytoin]   . Amlodipine Swelling  . Lipitor [Atorvastatin Calcium]     HEADACHES    Past Medical History  Diagnosis Date  . Coronary artery disease     STATUS POST CABG  . Hypertension   . Hypercholesterolemia   . CVA (cerebral vascular accident)     OCULAR CVA  . Hx: UTI (urinary tract infection)   . Glaucoma     History of glaucoma  . Bronchopneumonia     Right lung  . Urinary tract infection     Escherichia coli urinary tract infection  . Acute sinusitis   . Thrombocytopenia 2010     platelet count of 96 at discharge (95 on admission)  . Hyperlipidemia   . History of transient ischemic attack   . Hyponatremia     volume depletion -  resolved  . Volume overload     History of postoperative volume overload  . Delirium     Postoperative delirium  . Cataract   . CVA (cerebral infarction)     temporal hemorhage  . Gallstones   . Hepatic cyst     Past Surgical History  Procedure Laterality Date  . Cardiac catheterization  10/05/2006    NORMAL LEFT VENTRICULAR SIZE. THERE IS MODERATE GLOBAL HYPOKINESIA WITH OVERALL EF 40%  . Coronary artery bypass graft  09/2006    LIMA GRAFT TO THE LAD, SAPHENOUS VEIN GRAFT TO THE FIRST OBTUSE MARGINAL VESSEL WITH SEQUENTIAL GRAFT TO THE SECOND MARGINAL VESSEL , AND SAPHENOUS VEIN GRAFT TO THE PDA  . Cataract surgery      Bilateral cataract extraction-- x2  . Tendon repair      Status post repair of tendon of the left index finger  . Esophagogastroduodenoscopy N/A 04/22/2013    Procedure: ESOPHAGOGASTRODUODENOSCOPY (EGD);  Surgeon: Willis Modena, MD;  Location: [facility] ENDOSCOPY;  Service: Endoscopy;  Laterality: N/A;    History  Smoking status  . Never Smoker   Smokeless tobacco  . Never Used    History  Alcohol Use  . Yes    Comment: rarely  Family History  Problem Relation Age of Onset  . Heart failure Mother   . Heart failure Father   . Heart disease Brother     Review of Systems:  As noted in HPI. All other systems were reviewed and are negative.  Physical Exam: BP 120/60 mmHg  Pulse 78  Ht 5\' 5"  (1.651 m)  Wt 130 lb 14.4 oz (59.376 kg)  BMI 21.78 kg/m2 She is a pleasant white female in no acute distress. HEENT exam is unremarkable. She has no JVD or bruits. Lungs are clear. Cardiac exam reveals a regular rate and rhythm without gallop, murmur, or click. Abdomen is soft nontender without mass. Bowel sounds positive. She has no edema. Pedal pulses are good. Her right knee does not appear swollen or tender on exam.She is alert and oriented x3. Cranial nerves II through XII are intact. No focal findings.   LABORATORY DATA:  Labs reviewed from November and scanned  into chart.    Assessment / Plan: 1. Hemorrhagic CVA involving the temporal lobe. Continue baby aspirin.  2. History of seizures.  on Keppra.  3.  Hypertension- controlled.  4. Coronary disease status post CABG in 2008. No significant anginal symptoms.  5. Hyperlipidemia under excellent control on Crestor.  I will follow up in 6 months.

## 2014-04-07 NOTE — Patient Instructions (Signed)
Continue your current therapy  I will see you in 6 months.   

## 2014-04-16 ENCOUNTER — Other Ambulatory Visit: Payer: Self-pay | Admitting: Nurse Practitioner

## 2014-05-18 ENCOUNTER — Encounter: Payer: Self-pay | Admitting: Neurology

## 2014-05-18 ENCOUNTER — Ambulatory Visit (INDEPENDENT_AMBULATORY_CARE_PROVIDER_SITE_OTHER): Payer: Medicare Other | Admitting: Neurology

## 2014-05-18 VITALS — BP 145/67 | HR 61 | Ht 65.0 in | Wt 131.6 lb

## 2014-05-18 DIAGNOSIS — I6529 Occlusion and stenosis of unspecified carotid artery: Secondary | ICD-10-CM | POA: Diagnosis not present

## 2014-05-18 NOTE — Progress Notes (Signed)
PATIENT: Anne BeckmannMary F Hanna DOB: 04/01/1922  REASON FOR VISIT: routine follow up for stroke and seizures HISTORY FROM: patient  HISTORY OF PRESENT ILLNESS: [age] year lady who had a hemorrhagic left medial temporal infarct in December 2013 with post stroke seizures and mild memory loss. Remote history of left occipital and right subinsular infarcts with vascular risk factors of basilar artery stenosis, hypertension, hyperlipidemia and cerebrovascular disease.   08/29/12 (PS): She is seen for followup today after her last visit on 05/27/12. She continues to do well and has not had any further episodes of memory loss ,confusion ,seizures or strokes. She remains on aspirin which is tolerating well without bleeding, bruising or other side effects. She states that her cholesterol is under good control and was checked by her primary physician but I do not have the results. Her blood pressure today slightly elevated but she states that this usually fine. She plans on having cataract surgery and wants clearance for that. She did not undergo colonoscopy after last visit but he instead underwent CT colonography which was fine. She underwent followup EEG study on 05/28/12 which showed mild by hemispheric diffuse slowing without definite eliptiform activity.  02/27/13 (LL): Anne Hanna is seen for follow up today for seizures and stroke. She is doing well. 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 Planning to do an EGD to look for esophageal varices.  She remains on aspirin which is tolerating well without bleeding, bruising or other side effects. She states that her cholesterol is under good control and was checked by her primary physician but I do not have the results. BP in office today is 150/67, which is higher than it is at home, per daughter.   08/28/13 (LL):  Since last visit she has been well, no falls or illnesses.  EGD was reportedly normal.  She states that blood pressure is  under good control, in the office today is 133/65.  She has not had any seizures.  She is tolerating aspirin well with no signs of significant bleeding or bruising.  ROS:  14 system review of systems is positive for hearing loss only and all other systems negative. Update 05/18/2014 : She returns for follow-up after last visit in June 2015. She is accompanied by to offer her daughters. She is doing well from neurovascular standpoint without recurrent stroke or TIA symptoms. She continues to be bothered mainly by her poor hearing. She states her blood pressure is quite good and she takes hydrochlorothiazide regularly. She had lipid profile checked by her primary physician 3 months ago and it was okay. She remains on aspirin which is tolerating well without bleeding, bruising or other side effects. She is also on Keppra but has not had any recurrent seizures. She has no new complaints today. ALLERGIES: Allergies  Allergen Reactions  . Dilantin [Phenytoin]   . Amlodipine Swelling  . Lipitor [Atorvastatin Calcium]     HEADACHES    HOME MEDICATIONS: Outpatient Prescriptions Prior to Visit  Medication Sig Dispense Refill  . aspirin 81 MG tablet Take 81 mg by mouth daily.    . CRESTOR 10 MG tablet TAKE ONE TABLET BY MOUTH AT BEDTIME  30 tablet 3  . hydrochlorothiazide 25 MG tablet Take 25 mg by mouth daily.      Marland Kitchen. levETIRAcetam (KEPPRA) 250 MG tablet TAKE ONE TABLET BY MOUTH TWICE DAILY  60 tablet 1  . nebivolol (BYSTOLIC) 10 MG tablet Take 10 mg by  mouth daily.    . nitroGLYCERIN (NITROSTAT) 0.4 MG SL tablet Place 1 tablet (0.4 mg total) under the tongue every 5 (five) minutes as needed. 25 tablet 3  . Omeprazole (PRILOSEC PO) Take by mouth daily before breakfast.    . Timolol Maleate (ISTALOL) 0.5 % (DAILY) SOLN Place 1 drop into both eyes daily.     No facility-administered medications prior to visit.    PHYSICAL EXAM  Filed Vitals:   05/18/14 1105  BP: 145/67  Pulse: 61  Height:   (1.651 m)  Weight: 131 lb 9.6 oz (59.693 kg)   Body mass index is 21.9 kg/(m^2). No exam data present  General: frail elderly Caucasian lady in no distress  Head: head normocephalic and atraumatic. Orohparynx benign  Neck: supple with no carotid or supraclavicular bruits  Cardiovascular: regular rate and rhythm, no murmurs  Musculoskeletal: mild kyphosis  Skin: no rash/petichiae   Neurologic Exam  Mental Status: Awake and fully alert. Oriented to place and time. Mild reduced attention span, concentration and fund of knowledge appropriate. Mood and affect appropriate.  Cranial Nerves: Pupils equal, briskly reactive to light. Extraocular movements full without nystagmus. Visual fields full show dense right hemianopsia to confrontation. Hearing diminished bilaterally. Facial sensation intact. Face, tongue, palate moves normally and symmetrically.  Motor: Normal bulk and tone. Normal strength in all tested extremity muscles.  Sensory: intact to touch and pinprick in all 4 extremities Coordination: Rapid alternating movements normal in all extremities. Finger-to-nose and heel-to-shin performed accurately bilaterally.  Gait and Station: Arises from chair without difficulty. Stance is normal. Gait demonstrates normal stride length and balance. Tandem unsteady. Romberg negative.  Reflexes: 1+ and symmetric.    ASSESSMENT AND PLAN 58 year lady who at hemorrhagic left medial temporal infarct in December 2013 with post stroke seizures and mild memory loss. Remote history of left occipital and right subinsular infarcts with vascular risk factors of basilar artery stenosis, hypertension, hyperlipidemia and cerebrovascular disease.   Doing well from a stroke standpoint, no recurrent seizures.   PLAN:   I had a long d/w patient and 2 daughters about her remote strokes, risk for recurrent stroke/TIAs, personally independently reviewed imaging studies and stroke evaluation results and answered  questions.Continue aspirin 81 mg orally every day  for secondary stroke prevention and maintain strict control of hypertension with blood pressure goal below 130/90, diabetes with hemoglobin A1c goal below 6.5% and lipids with LDL cholesterol goal below 100 mg/dL. I also advised the patient to eat a healthy diet with plenty of whole grains, cereals, fruits and vegetables, exercise regularly and maintain ideal body weight .check screening carotid ultrasound study. I encouraged the patient to do mentally challenging task like solving crossword puzzles, sudoku to help with her mild cognitive impairment which appears to be stable Followup in the future with me in  6 months.    Delia Heady, MD  05/18/2014, 4:26 PM [facility] [address], [address] [place], [place] [address] [phone]  Note: This document was prepared with digital dictation and possible smart phrase technology. Any transcriptional errors that result from this process are unintentional.

## 2014-05-18 NOTE — Patient Instructions (Signed)
I had a long d/w patient and 2 daughters about her remote strokes, risk for recurrent stroke/TIAs, personally independently reviewed imaging studies and stroke evaluation results and answered questions.Continue aspirin 81 mg orally every day  for secondary stroke prevention and maintain strict control of hypertension with blood pressure goal below 130/90, diabetes with hemoglobin A1c goal below 6.5% and lipids with LDL cholesterol goal below 100 mg/dL. I also advised the patient to eat a healthy diet with plenty of whole grains, cereals, fruits and vegetables, exercise regularly and maintain ideal body weight .check screening carotid ultrasound study. I encouraged the patient to do mentally challenging task like solving crossword puzzles, sudoku to help with her mild cognitive impairment which appears to be stable Followup in the future with me in  6 months.

## 2014-05-20 ENCOUNTER — Other Ambulatory Visit: Payer: Self-pay | Admitting: *Deleted

## 2014-05-20 ENCOUNTER — Telehealth: Payer: Self-pay | Admitting: Neurology

## 2014-05-20 DIAGNOSIS — R569 Unspecified convulsions: Secondary | ICD-10-CM

## 2014-05-20 MED ORDER — LEVETIRACETAM 250 MG PO TABS: 250.0000 mg | ORAL_TABLET | Freq: Two times a day (BID) | ORAL | Status: DC

## 2014-05-20 NOTE — Telephone Encounter (Signed)
Called patient back and informed her that Rx is ready for pick up, call back with any other questions.

## 2014-05-20 NOTE — Telephone Encounter (Signed)
Patient's daughter stated [facility] needs hard copy of Rx levETIRAcetam (KEPPRA) 250 MG tablet.  Requesting to pick up written Rx.  Please call and advise.

## 2014-05-21 ENCOUNTER — Telehealth: Payer: Self-pay | Admitting: Radiology

## 2014-05-21 NOTE — Telephone Encounter (Signed)
Left voice message to return call for scheduling carotid doppler study.

## 2014-06-03 ENCOUNTER — Ambulatory Visit (INDEPENDENT_AMBULATORY_CARE_PROVIDER_SITE_OTHER): Payer: Medicare Other

## 2014-06-03 DIAGNOSIS — I6529 Occlusion and stenosis of unspecified carotid artery: Secondary | ICD-10-CM | POA: Diagnosis not present

## 2014-06-09 ENCOUNTER — Telehealth: Payer: Self-pay | Admitting: Neurology

## 2014-06-09 DIAGNOSIS — R569 Unspecified convulsions: Secondary | ICD-10-CM

## 2014-06-09 NOTE — Telephone Encounter (Signed)
Patient's daughter wants to change her from generic Levetiracetam to Brand Name Only Keppra.  Okay to update?

## 2014-06-09 NOTE — Telephone Encounter (Signed)
Pt's daughter is calling wanting pt's Rx for levETIRAcetam (KEPPRA) 250 MG tablet to be rewritten for Brand name.  Please call and advise.

## 2014-06-09 NOTE — Telephone Encounter (Signed)
Ok if insurance will cover or they can afford it

## 2014-06-10 MED ORDER — LEVETIRACETAM 250 MG PO TABS: 250.0000 mg | ORAL_TABLET | Freq: Two times a day (BID) | ORAL | Status: DC

## 2014-06-10 NOTE — Telephone Encounter (Signed)
Rx has been updated.  I called back, got no answer.  Left message relaying providers note.

## 2014-06-15 ENCOUNTER — Telehealth: Payer: Self-pay

## 2014-06-15 NOTE — Telephone Encounter (Signed)
[facility]([employer]) has approved the request for coverage on Keppra (Brand Name) effective until 03/20/2015 Ref # [id number]

## 2014-07-08 ENCOUNTER — Other Ambulatory Visit: Payer: Self-pay | Admitting: Neurology

## 2014-07-08 ENCOUNTER — Telehealth: Payer: Self-pay | Admitting: *Deleted

## 2014-07-08 DIAGNOSIS — R569 Unspecified convulsions: Secondary | ICD-10-CM

## 2014-07-08 MED ORDER — LEVETIRACETAM 250 MG PO TABS: 250.0000 mg | ORAL_TABLET | Freq: Two times a day (BID) | ORAL | Status: DC

## 2014-07-08 NOTE — Telephone Encounter (Signed)
Requesting written Rx for Keppra.  Dr Pearlean BrownieSethi is not in the office.  [facility]

## 2014-07-08 NOTE — Telephone Encounter (Signed)
Pt's daughter Olegario MessierKathy is calling requesting a written Rx for levETIRAcetam (KEPPRA) 250 MG tablet Brand Name for 6 months.  Do not send to [facility].  She would like to pick this Rx up.  Please call and advise.  Please [phone] you can leave a detailed message if no answer.

## 2014-07-08 NOTE — Telephone Encounter (Signed)
Patient's dgt requested to pick up Keppra rx - up front and ready - dgt aware.

## 2014-07-24 ENCOUNTER — Other Ambulatory Visit: Payer: Self-pay | Admitting: *Deleted

## 2014-07-24 MED ORDER — ROSUVASTATIN CALCIUM 10 MG PO TABS: 10.0000 mg | ORAL_TABLET | Freq: Every day | ORAL | Status: DC

## 2014-10-05 ENCOUNTER — Encounter: Payer: Self-pay | Admitting: Cardiology

## 2014-10-05 ENCOUNTER — Ambulatory Visit (INDEPENDENT_AMBULATORY_CARE_PROVIDER_SITE_OTHER): Payer: Medicare Other | Admitting: Cardiology

## 2014-10-05 VITALS — BP 138/52 | HR 65 | Ht 65.0 in | Wt 127.0 lb

## 2014-10-05 DIAGNOSIS — I10 Essential (primary) hypertension: Secondary | ICD-10-CM

## 2014-10-05 DIAGNOSIS — E7800 Pure hypercholesterolemia, unspecified: Secondary | ICD-10-CM

## 2014-10-05 DIAGNOSIS — I2510 Atherosclerotic heart disease of native coronary artery without angina pectoris: Secondary | ICD-10-CM | POA: Diagnosis not present

## 2014-10-05 DIAGNOSIS — I6529 Occlusion and stenosis of unspecified carotid artery: Secondary | ICD-10-CM

## 2014-10-05 NOTE — Progress Notes (Signed)
Anne Hanna Date of Birth: 1922/08/12   History of Present Illness: Anne Hanna is seen today for followup of her coronary disease. She is status post CABG in 2008. She has a history of a hemorrhagic stroke in December 2013 involving the temporal lobe. She has done well from a cardiac standpoint without any recurrent symptoms of chest pain, shortness of breath, or palpitations. She has lost 4 lbs. She does complain of low back pain if she is on her feet a lot.    Current Outpatient Prescriptions on File Prior to Visit  Medication Sig Dispense Refill  . aspirin 81 MG tablet Take 81 mg by mouth daily.    . hydrochlorothiazide 25 MG tablet Take 25 mg by mouth daily.      Marland Kitchen levETIRAcetam (KEPPRA) 250 MG tablet Take 1 tablet (250 mg total) by mouth 2 (two) times daily. 60 tablet 6  . nebivolol (BYSTOLIC) 10 MG tablet Take 10 mg by mouth daily.    . nitroGLYCERIN (NITROSTAT) 0.4 MG SL tablet Place 1 tablet (0.4 mg total) under the tongue every 5 (five) minutes as needed. 25 tablet 3  . Omeprazole (PRILOSEC PO) Take by mouth daily before breakfast.    . rosuvastatin (CRESTOR) 10 MG tablet Take 1 tablet (10 mg total) by mouth at bedtime. 30 tablet 3  . Timolol Maleate (ISTALOL) 0.5 % (DAILY) SOLN Place 1 drop into both eyes daily.     No current facility-administered medications on file prior to visit.    Allergies  Allergen Reactions  . Dilantin [Phenytoin]   . Amlodipine Swelling  . Lipitor [Atorvastatin Calcium]     HEADACHES    Past Medical History  Diagnosis Date  . Coronary artery disease     STATUS POST CABG  . Hypertension   . Hypercholesterolemia   . CVA (cerebral vascular accident)     OCULAR CVA  . Hx: UTI (urinary tract infection)   . Glaucoma     History of glaucoma  . Bronchopneumonia     Right lung  . Urinary tract infection     Escherichia coli urinary tract infection  . Acute sinusitis   . Thrombocytopenia 2010     platelet count of 96 at discharge (95  on admission)  . Hyperlipidemia   . History of transient ischemic attack   . Hyponatremia     volume depletion - resolved  . Volume overload     History of postoperative volume overload  . Delirium     Postoperative delirium  . Cataract   . CVA (cerebral infarction)     temporal hemorhage  . Gallstones   . Hepatic cyst     Past Surgical History  Procedure Laterality Date  . Cardiac catheterization  10/05/2006    NORMAL LEFT VENTRICULAR SIZE. THERE IS MODERATE GLOBAL HYPOKINESIA WITH OVERALL EF 40%  . Coronary artery bypass graft  09/2006    LIMA GRAFT TO THE LAD, SAPHENOUS VEIN GRAFT TO THE FIRST OBTUSE MARGINAL VESSEL WITH SEQUENTIAL GRAFT TO THE SECOND MARGINAL VESSEL , AND SAPHENOUS VEIN GRAFT TO THE PDA  . Cataract surgery      Bilateral cataract extraction-- x2  . Tendon repair      Status post repair of tendon of the left index finger  . Esophagogastroduodenoscopy N/A 04/22/2013    Procedure: ESOPHAGOGASTRODUODENOSCOPY (EGD);  Surgeon: Willis Modena, MD;  Location: [facility] ENDOSCOPY;  Service: Endoscopy;  Laterality: N/A;    History  Smoking status  . Never Smoker  Smokeless tobacco  . Never Used    History  Alcohol Use No    Family History  Problem Relation Age of Onset  . Heart failure Mother   . Heart failure Father   . Heart disease Brother     Review of Systems:  As noted in HPI. All other systems were reviewed and are negative.  Physical Exam: BP 138/52 mmHg  Pulse 65  Ht 5\' 5"  (1.651 m)  Wt 57.607 kg (127 lb)  BMI 21.13 kg/m2 She is a pleasant white female in no acute distress. HEENT exam is unremarkable. She has no JVD or bruits. Lungs are clear. Cardiac exam reveals a regular rate and rhythm without gallop, murmur, or click. Abdomen is soft nontender without mass. Bowel sounds positive. She has no edema. Pedal pulses are good. Her right knee does not appear swollen or tender on exam.She is alert and oriented x3. Cranial nerves II through XII are  intact. No focal findings.   LABORATORY DATA:  Ecg today shows NSR with normal Ecg. I have personally reviewed and interpreted this study.    Assessment / Plan: 1. Hemorrhagic CVA involving the temporal lobe. Continue baby aspirin.  2. History of seizures.  on Keppra.  3.  Hypertension- controlled.  4. Coronary disease status post CABG in 2008. No significant anginal symptoms.  5. Hyperlipidemia under excellent control on Crestor.  I will follow up in 6 months.

## 2014-11-04 ENCOUNTER — Encounter: Payer: Self-pay | Admitting: Nurse Practitioner

## 2014-11-04 ENCOUNTER — Ambulatory Visit (INDEPENDENT_AMBULATORY_CARE_PROVIDER_SITE_OTHER): Payer: Medicare Other | Admitting: Nurse Practitioner

## 2014-11-04 VITALS — BP 142/64 | HR 62 | Ht 65.0 in | Wt 131.6 lb

## 2014-11-04 DIAGNOSIS — I10 Essential (primary) hypertension: Secondary | ICD-10-CM

## 2014-11-04 DIAGNOSIS — I639 Cerebral infarction, unspecified: Secondary | ICD-10-CM

## 2014-11-04 DIAGNOSIS — R569 Unspecified convulsions: Secondary | ICD-10-CM

## 2014-11-04 MED ORDER — LEVETIRACETAM 250 MG PO TABS: 250.0000 mg | ORAL_TABLET | Freq: Two times a day (BID) | ORAL | Status: AC

## 2014-11-04 NOTE — Progress Notes (Signed)
[facility]  PATIENT: Anne Hanna DOB: [birth year]   REASON FOR VISIT: Follow-up for history of stroke in 2013 with post stroke seizures and mild cognitive impairment HISTORY FROM: Patient and daughter    HISTORY OF PRESENT ILLNESS:89 year lady who had a hemorrhagic left medial temporal infarct in December 2013 with post stroke seizures and mild memory loss. Remote history of left occipital and right subinsular infarcts with vascular risk factors of basilar artery stenosis, hypertension, hyperlipidemia and cerebrovascular disease.   08/29/12 (PS): She is seen for followup today after her last visit on 05/27/12. She continues to do well and has not had any further episodes of memory loss ,confusion ,seizures or strokes. She remains on aspirin which is tolerating well without bleeding, bruising or other side effects. She states that her cholesterol is under good control and was checked by her primary physician but I do not have the results. Her blood pressure today slightly elevated but she states that this usually fine. She plans on having cataract surgery and wants clearance for that. She did not undergo colonoscopy after last visit but he instead underwent CT colonography which was fine. She underwent followup EEG study on 05/28/12 which showed mild by hemispheric diffuse slowing without definite eliptiform activity.   Update 05/18/2014 PS: She returns for follow-up after last visit in June 2015. She is accompanied by her  daughter. She is doing well from neurovascular standpoint without recurrent stroke or TIA symptoms. She continues to be bothered mainly by her poor hearing. She states her blood pressure is quite good and she takes hydrochlorothiazide regularly. She had lipid profile checked by her primary physician 3 months ago and it was okay. She remains on aspirin which is tolerating well without bleeding, bruising or other side effects. She is also on Keppra but has not had  any recurrent seizures. She has no new complaints today.  UPDATE 11/04/2014 Anne Hanna, 81 year old female returns for follow-up. She has not had further stroke or TIA symptoms and is doing well from neurovascular standpoint. She remains on aspirin 0.81 daily without significant bruising or bleeding. She is also on Keppra and has not had any recurrent seizure since her initial stroke. Carotid Doppler done on 06/03/2014 was negative for significant stenosis involving the extracranial carotid arteries bilaterally. She is extremely hard of hearing. Blood pressure is  stable according to the daughter and is 142/62 in the office today lipid profile is followed by her primary care. She returns for reevaluation, she has no new complaints    REVIEW OF SYSTEMS: Full 14 system review of systems performed and notable only for those listed, all others are neg:  Constitutional: neg  Cardiovascular: neg Ear/Nose/Throat: neg  Skin: neg Eyes: neg Respiratory: neg Gastroitestinal: neg  Hematology/Lymphatic: neg  Endocrine: neg Musculoskeletal:neg Allergy/Immunology: neg Neurological: neg Psychiatric: neg Sleep : neg   ALLERGIES: Allergies  Allergen Reactions  . Dilantin [Phenytoin]   . Amlodipine Swelling  . Lipitor [Atorvastatin Calcium]     HEADACHES    HOME MEDICATIONS: Outpatient Prescriptions Prior to Visit  Medication Sig Dispense Refill  . aspirin 81 MG tablet Take 81 mg by mouth daily.    . hydrochlorothiazide 25 MG tablet Take 25 mg by mouth daily.      Marland Kitchen LATANOPROST OP Apply 1 drop to eye daily. One drop in each eye daily.    Marland Kitchen levETIRAcetam (KEPPRA) 250 MG tablet Take 1 tablet (250 mg total) by mouth 2 (two) times daily. 60 tablet  6  . nebivolol (BYSTOLIC) 10 MG tablet Take 10 mg by mouth daily.    . nitroGLYCERIN (NITROSTAT) 0.4 MG SL tablet Place 1 tablet (0.4 mg total) under the tongue every 5 (five) minutes as needed. 25 tablet 3  . Omeprazole (PRILOSEC PO) Take 20 mg by mouth  daily before breakfast.     . rosuvastatin (CRESTOR) 10 MG tablet Take 1 tablet (10 mg total) by mouth at bedtime. 30 tablet 3  . Timolol Maleate (ISTALOL) 0.5 % (DAILY) SOLN Place 1 drop into both eyes daily.     No facility-administered medications prior to visit.    PAST MEDICAL HISTORY: Past Medical History  Diagnosis Date  . Coronary artery disease     STATUS POST CABG  . Hypertension   . Hypercholesterolemia   . CVA (cerebral vascular accident)     OCULAR CVA  . Hx: UTI (urinary tract infection)   . Glaucoma     History of glaucoma  . Bronchopneumonia     Right lung  . Urinary tract infection     Escherichia coli urinary tract infection  . Acute sinusitis   . Thrombocytopenia 2010     platelet count of 96 at discharge (95 on admission)  . Hyperlipidemia   . History of transient ischemic attack   . Hyponatremia     volume depletion - resolved  . Volume overload     History of postoperative volume overload  . Delirium     Postoperative delirium  . Cataract   . CVA (cerebral infarction)     temporal hemorhage  . Gallstones   . Hepatic cyst     PAST SURGICAL HISTORY: Past Surgical History  Procedure Laterality Date  . Cardiac catheterization  10/05/2006    NORMAL LEFT VENTRICULAR SIZE. THERE IS MODERATE GLOBAL HYPOKINESIA WITH OVERALL EF 40%  . Coronary artery bypass graft  09/2006    LIMA GRAFT TO THE LAD, SAPHENOUS VEIN GRAFT TO THE FIRST OBTUSE MARGINAL VESSEL WITH SEQUENTIAL GRAFT TO THE SECOND MARGINAL VESSEL , AND SAPHENOUS VEIN GRAFT TO THE PDA  . Cataract surgery      Bilateral cataract extraction-- x2  . Tendon repair      Status post repair of tendon of the left index finger  . Esophagogastroduodenoscopy N/A 04/22/2013    Procedure: ESOPHAGOGASTRODUODENOSCOPY (EGD);  Surgeon: Willis Modena, MD;  Location: [facility] ENDOSCOPY;  Service: Endoscopy;  Laterality: N/A;    FAMILY HISTORY: Family History  Problem Relation Age of Onset  . Heart failure Mother   .  Heart failure Father   . Heart disease Brother     SOCIAL HISTORY: Social History   Social History  . Marital Status: Widowed    Spouse Name: N/A  . Number of Children: 5  . Years of Education: 16   Occupational History  . business office    Social History Main Topics  . Smoking status: Never Smoker   . Smokeless tobacco: Never Used  . Alcohol Use: No  . Drug Use: No  . Sexual Activity: Not on file   Other Topics Concern  . Not on file   Social History Narrative   Patient is a widow and lives at home alone.   Patient has 5 children.   Patient is retired.   Patient has a college education.   Patient is right-handed.   Patient drinks two cups of coffee every morning.           PHYSICAL EXAM  Filed Vitals:  11/04/14 1100  BP: 142/64  Pulse: 62  Height: 5\' 5"  (1.651 m)  Weight: 131 lb 9.6 oz (59.693 kg)   Body mass index is 21.9 kg/(m^2). General: frail elderly Caucasian lady in no distress  Head: head normocephalic and atraumatic. Orohparynx benign  Neck: supple with no carotid or supraclavicular bruits  Cardiovascular: regular rate and rhythm, no murmurs  Musculoskeletal: mild kyphosis  Skin: no rash/petichiae   Neurologic Exam  Mental Status: Awake and fully alert. Oriented to place and time. Mild reduced attention span, concentration and fund of knowledge appropriate. Mood and affect appropriate.  Cranial Nerves: Pupils equal, briskly reactive to light. Extraocular movements full without nystagmus. Visual fields full show dense right hemianopsia to confrontation. Hearing diminished bilaterally. Facial sensation intact. Face, tongue, palate moves normally and symmetrically.  Motor: Normal bulk and tone. Normal strength in all tested extremity muscles.  Sensory: intact to touch and pinprick in all 4 extremities Coordination: Rapid alternating movements normal in all extremities. Finger-to-nose and heel-to-shin performed accurately bilaterally.   Gait and Station: Arises from chair without difficulty. Stance is normal. Gait demonstrates normal stride length and balance. Tandem unsteady. Romberg negative. Ambulates with single-point cane Reflexes: 1+ and symmetric.    DIAGNOSTIC DATA (LABS, IMAGING, TESTING) -   ASSESSMENT AND PLAN 75 year lady who had a  hemorrhagic left medial temporal infarct in December 2013 with post stroke seizures and mild memory loss. Remote history of left occipital and right subinsular infarcts with vascular risk factors of basilar artery stenosis, hypertension, hyperlipidemia and cerebrovascular disease. Doing well from a stroke standpoint, no recurrent seizures.   Continue aspirin 81 mg orally every day for secondary stroke prevention I spent additional 10 minutes in total face to face time with the patient more than 50% of which was spent counseling and coordination of care, reviewing test results, given results of carotid Doppler from 06/03/14  reviewing medications and discussing and reviewing the diagnosis of stroke and risk factor management Maintain strict control of hypertension with blood pressure goal below 130/90, today's reading 142/64. lipids with LDL cholesterol goal below 100 mg/dL.  Use cane at all times for safe ambulation as she is at risk for falls  Continue  healthy diet with plenty of whole grains, cereals, fruits and vegetables Continue Keppra at current dose Rx to patient as requested Follow-up yearly and when necessary Vst time 25 min Nilda Riggs, [facility], [facility], APRN  [facility] [address], [address] [place], [place] [address] [phone]

## 2014-11-04 NOTE — Progress Notes (Signed)
I agree with the above plan 

## 2014-11-04 NOTE — Patient Instructions (Addendum)
Continue aspirin 81 mg orally every day for secondary stroke prevention Maintain strict control of hypertension with blood pressure goal below 130/90, today's reading 142/64. lipids with LDL cholesterol goal below 100 mg/dL.  Use cane at all times for safe ambulation as she is at risk for falls  Continue  healthy diet with plenty of whole grains, cereals, fruits and vegetables Continue Keppra at current dose Rx to patient as requested Follow-up yearly and when necessary

## 2014-11-12 ENCOUNTER — Other Ambulatory Visit: Payer: Self-pay | Admitting: Internal Medicine

## 2014-11-16 ENCOUNTER — Ambulatory Visit: Payer: PRIVATE HEALTH INSURANCE | Admitting: Neurology

## 2014-11-18 ENCOUNTER — Telehealth: Payer: Self-pay | Admitting: Cardiology

## 2014-11-18 MED ORDER — CRESTOR 10 MG PO TABS: 10.0000 mg | ORAL_TABLET | Freq: Every day | ORAL | Status: DC

## 2014-11-18 NOTE — Telephone Encounter (Signed)
Returned call to patient's daughter Bonita Quin no answer.Left message on personal voice mail Dr.Jordan out of office,will have him sign crestor prescription and call her when ready for pick up.

## 2014-11-18 NOTE — Telephone Encounter (Signed)
Pt's daughter Bonita Quin called in requesting a hand written prescription for Crestor so she can find somewhere to get it filled. The pt is refusing to take the generic form. Please f/u with her  Thanks

## 2014-11-19 NOTE — Telephone Encounter (Signed)
Did the daughter return your call?

## 2014-11-20 NOTE — Telephone Encounter (Signed)
Returned call to patient's daughter Bonita Quin. Crestor written prescription will be left at [facility] office front desk for pick up.

## 2015-02-22 ENCOUNTER — Telehealth: Payer: Self-pay | Admitting: Cardiology

## 2015-02-22 MED ORDER — ROSUVASTATIN CALCIUM 10 MG PO TABS: 10.0000 mg | ORAL_TABLET | Freq: Every day | ORAL | Status: AC

## 2015-02-22 MED ORDER — ROSUVASTATIN CALCIUM 10 MG PO TABS: 10.0000 mg | ORAL_TABLET | Freq: Every day | ORAL | Status: DC

## 2015-02-22 NOTE — Telephone Encounter (Signed)
Printed Rx signed by Dr. Swaz[place] & placed at front desk accordion file. Informed patient's daughter.

## 2015-02-22 NOTE — Telephone Encounter (Signed)
New message      *STAT* If patient is at the pharmacy, call can be transferred to refill team.   1. Which medications need to be refilled? (please list name of each medication and dose if known)  crestor 10mg  2. Which pharmacy/location (including street and city if local pharmacy) is medication to be sent to? Need written presc for someone to pick up--call when ready  3. Do they need a 30 day or 90 day supply?  90 day

## 2015-05-03 ENCOUNTER — Ambulatory Visit (INDEPENDENT_AMBULATORY_CARE_PROVIDER_SITE_OTHER): Payer: Medicare Other | Admitting: Cardiology

## 2015-05-03 ENCOUNTER — Encounter: Payer: Self-pay | Admitting: Cardiology

## 2015-05-03 VITALS — BP 146/76 | HR 84 | Ht 65.0 in | Wt 125.3 lb

## 2015-05-03 DIAGNOSIS — I2510 Atherosclerotic heart disease of native coronary artery without angina pectoris: Secondary | ICD-10-CM

## 2015-05-03 DIAGNOSIS — E7800 Pure hypercholesterolemia, unspecified: Secondary | ICD-10-CM

## 2015-05-03 DIAGNOSIS — I10 Essential (primary) hypertension: Secondary | ICD-10-CM | POA: Diagnosis not present

## 2015-05-03 NOTE — Patient Instructions (Signed)
Continue your current therapy  I will see you in 6 months.   

## 2015-05-03 NOTE — Progress Notes (Signed)
Anne Hanna Date of Birth: [birth year]   History of Present Illness: Anne Hanna is seen today for followup of her coronary disease. She is seen with her daughter. She is now 82 years old. She is status post CABG in 2008. She has a history of a hemorrhagic stroke in December 2013 involving the temporal lobe.  She has done well from a cardiac standpoint without any recurrent symptoms of chest pain, shortness of breath, or palpitations. She has lost 6 lbs. She has some aches and pains but overall feels pretty well. She is fairly sedentary. No longer drives. Doesn't feel safe walking around her neighborhood. Admits she doesn't feel hungry anymore.    Current Outpatient Prescriptions on File Prior to Visit  Medication Sig Dispense Refill  . aspirin 81 MG tablet Take 81 mg by mouth daily.    . hydrochlorothiazide 25 MG tablet Take 25 mg by mouth daily.      Marland Kitchen LATANOPROST OP Apply 1 drop to eye daily. One drop in each eye daily.    Marland Kitchen levETIRAcetam (KEPPRA) 250 MG tablet Take 1 tablet (250 mg total) by mouth 2 (two) times daily. 180 tablet 3  . nebivolol (BYSTOLIC) 10 MG tablet Take 10 mg by mouth daily.    . nitroGLYCERIN (NITROSTAT) 0.4 MG SL tablet Place 1 tablet (0.4 mg total) under the tongue every 5 (five) minutes as needed. 25 tablet 3  . rosuvastatin (CRESTOR) 10 MG tablet Take 1 tablet (10 mg total) by mouth daily. 90 tablet 1  . Timolol Maleate (ISTALOL) 0.5 % (DAILY) SOLN Place 1 drop into both eyes daily.     No current facility-administered medications on file prior to visit.    Allergies  Allergen Reactions  . Dilantin [Phenytoin]   . Amlodipine Swelling  . Lipitor [Atorvastatin Calcium]     HEADACHES    Past Medical History  Diagnosis Date  . Coronary artery disease     STATUS POST CABG  . Hypertension   . Hypercholesterolemia   . CVA (cerebral vascular accident) (HCC)     OCULAR CVA  . Hx: UTI (urinary tract infection)   . Glaucoma     History of glaucoma  .  Bronchopneumonia     Right lung  . Urinary tract infection     Escherichia coli urinary tract infection  . Acute sinusitis   . Thrombocytopenia (HCC) 2010     platelet count of 96 at discharge (95 on admission)  . Hyperlipidemia   . History of transient ischemic attack   . Hyponatremia     volume depletion - resolved  . Volume overload     History of postoperative volume overload  . Delirium     Postoperative delirium  . Cataract   . CVA (cerebral infarction)     temporal hemorhage  . Gallstones   . Hepatic cyst     Past Surgical History  Procedure Laterality Date  . Cardiac catheterization  10/05/2006    NORMAL LEFT VENTRICULAR SIZE. THERE IS MODERATE GLOBAL HYPOKINESIA WITH OVERALL EF 40%  . Coronary artery bypass graft  09/2006    LIMA GRAFT TO THE LAD, SAPHENOUS VEIN GRAFT TO THE FIRST OBTUSE MARGINAL VESSEL WITH SEQUENTIAL GRAFT TO THE SECOND MARGINAL VESSEL , AND SAPHENOUS VEIN GRAFT TO THE PDA  . Cataract surgery      Bilateral cataract extraction-- x2  . Tendon repair      Status post repair of tendon of the left index finger  .  Esophagogastroduodenoscopy N/A 04/22/2013    Procedure: ESOPHAGOGASTRODUODENOSCOPY (EGD);  Surgeon: Willis Modena, MD;  [facility];  Service: Endoscopy;  Laterality: N/A;    History  Smoking status  . Never Smoker   Smokeless tobacco  . Never Used    History  Alcohol Use No    Family History  Problem Relation Age of Onset  . Heart failure Mother   . Heart failure Father   . Heart disease Brother     Review of Systems:  As noted in HPI. All other systems were reviewed and are negative.  Physical Exam: BP 146/76 mmHg  Pulse 84  Ht  (1.651 m)  Wt 56.841 kg (125 lb 5 oz)  BMI 20.85 kg/m2 She is a pleasant white female in no acute distress. HEENT exam is unremarkable. She has no JVD or bruits. Lungs are clear. Cardiac exam reveals a regular rate and rhythm without gallop, murmur, or click. Normal S1-2. Abdomen is  soft nontender without mass. Bowel sounds positive. She has no edema. Pedal pulses are good. Her right knee does not appear swollen or tender on exam.She is alert and oriented x3. Cranial nerves II through XII are intact. No focal findings.   LABORATORY DATA:      Assessment / Plan: 1. Hemorrhagic CVA involving the temporal lobe. Continue baby aspirin.  2. History of seizures.  on Keppra.  3.  Hypertension- controlled.  4. Coronary disease status post CABG in 2008. No significant anginal symptoms.  5. Hyperlipidemia on Crestor. Labs followed by primary care.  Encourage increased activity in the home. I will follow up in 6 months.

## 2015-05-26 ENCOUNTER — Encounter (HOSPITAL_COMMUNITY): Payer: Self-pay | Admitting: *Deleted

## 2015-05-26 ENCOUNTER — Observation Stay (HOSPITAL_COMMUNITY): Payer: Medicare Other

## 2015-05-26 ENCOUNTER — Observation Stay (HOSPITAL_COMMUNITY)
Admission: EM | Admit: 2015-05-26 | Discharge: 2015-05-28 | Disposition: A | Discharge status: Home Health | Payer: Medicare Other | Attending: Internal Medicine | Admitting: Internal Medicine

## 2015-05-26 ENCOUNTER — Emergency Department (HOSPITAL_COMMUNITY): Payer: Medicare Other

## 2015-05-26 DIAGNOSIS — I10 Essential (primary) hypertension: Secondary | ICD-10-CM | POA: Diagnosis not present

## 2015-05-26 DIAGNOSIS — J189 Pneumonia, unspecified organism: Secondary | ICD-10-CM | POA: Diagnosis present

## 2015-05-26 DIAGNOSIS — E7800 Pure hypercholesterolemia, unspecified: Secondary | ICD-10-CM | POA: Diagnosis not present

## 2015-05-26 DIAGNOSIS — Z951 Presence of aortocoronary bypass graft: Secondary | ICD-10-CM | POA: Diagnosis not present

## 2015-05-26 DIAGNOSIS — R262 Difficulty in walking, not elsewhere classified: Secondary | ICD-10-CM | POA: Insufficient documentation

## 2015-05-26 DIAGNOSIS — I2510 Atherosclerotic heart disease of native coronary artery without angina pectoris: Secondary | ICD-10-CM | POA: Diagnosis present

## 2015-05-26 DIAGNOSIS — I110 Hypertensive heart disease with heart failure: Secondary | ICD-10-CM | POA: Insufficient documentation

## 2015-05-26 DIAGNOSIS — R778 Other specified abnormalities of plasma proteins: Secondary | ICD-10-CM | POA: Diagnosis present

## 2015-05-26 DIAGNOSIS — R17 Unspecified jaundice: Secondary | ICD-10-CM | POA: Diagnosis present

## 2015-05-26 DIAGNOSIS — Z7982 Long term (current) use of aspirin: Secondary | ICD-10-CM | POA: Diagnosis not present

## 2015-05-26 DIAGNOSIS — Z66 Do not resuscitate: Secondary | ICD-10-CM | POA: Diagnosis not present

## 2015-05-26 DIAGNOSIS — R7989 Other specified abnormal findings of blood chemistry: Secondary | ICD-10-CM | POA: Diagnosis present

## 2015-05-26 DIAGNOSIS — R0600 Dyspnea, unspecified: Secondary | ICD-10-CM | POA: Diagnosis present

## 2015-05-26 DIAGNOSIS — J9601 Acute respiratory failure with hypoxia: Secondary | ICD-10-CM | POA: Diagnosis present

## 2015-05-26 DIAGNOSIS — Z79899 Other long term (current) drug therapy: Secondary | ICD-10-CM | POA: Diagnosis not present

## 2015-05-26 DIAGNOSIS — Z8673 Personal history of transient ischemic attack (TIA), and cerebral infarction without residual deficits: Secondary | ICD-10-CM | POA: Diagnosis not present

## 2015-05-26 DIAGNOSIS — R748 Abnormal levels of other serum enzymes: Secondary | ICD-10-CM | POA: Diagnosis not present

## 2015-05-26 DIAGNOSIS — D649 Anemia, unspecified: Secondary | ICD-10-CM | POA: Diagnosis not present

## 2015-05-26 DIAGNOSIS — D696 Thrombocytopenia, unspecified: Secondary | ICD-10-CM | POA: Insufficient documentation

## 2015-05-26 DIAGNOSIS — D509 Iron deficiency anemia, unspecified: Principal | ICD-10-CM | POA: Insufficient documentation

## 2015-05-26 DIAGNOSIS — R0602 Shortness of breath: Secondary | ICD-10-CM | POA: Diagnosis present

## 2015-05-26 DIAGNOSIS — N179 Acute kidney failure, unspecified: Secondary | ICD-10-CM | POA: Diagnosis not present

## 2015-05-26 DIAGNOSIS — K7460 Unspecified cirrhosis of liver: Secondary | ICD-10-CM | POA: Diagnosis not present

## 2015-05-26 DIAGNOSIS — I509 Heart failure, unspecified: Secondary | ICD-10-CM | POA: Diagnosis not present

## 2015-05-26 HISTORY — DX: Unspecified cirrhosis of liver: K74.60

## 2015-05-26 LAB — BASIC METABOLIC PANEL
ANION GAP: 17 — AB (ref 5–15)
BUN: 41 mg/dL — AB (ref 6–20)
CO2: 16 mmol/L — ABNORMAL LOW (ref 22–32)
Calcium: 10.2 mg/dL (ref 8.9–10.3)
Chloride: 99 mmol/L — ABNORMAL LOW (ref 101–111)
Creatinine, Ser: 1.93 mg/dL — ABNORMAL HIGH (ref 0.44–1.00)
GFR calc Af Amer: 25 mL/min — ABNORMAL LOW (ref >60)
GFR, EST NON AFRICAN AMERICAN: 22 mL/min — AB (ref >60)
Glucose, Bld: 115 mg/dL — ABNORMAL HIGH (ref 65–99)
Potassium: 5 mmol/L (ref 3.5–5.1)
SODIUM: 132 mmol/L — AB (ref 135–145)

## 2015-05-26 LAB — POC OCCULT BLOOD, ED: FECAL OCCULT BLD: NEGATIVE

## 2015-05-26 LAB — COMPREHENSIVE METABOLIC PANEL
ALBUMIN: 3.2 g/dL — AB (ref 3.5–5.0)
ALK PHOS: 43 U/L (ref 38–126)
ALT: 17 U/L (ref 14–54)
AST: 37 U/L (ref 15–41)
Anion gap: 11 (ref 5–15)
BUN: 44 mg/dL — AB (ref 6–20)
CALCIUM: 9.8 mg/dL (ref 8.9–10.3)
CO2: 22 mmol/L (ref 22–32)
CREATININE: 1.91 mg/dL — AB (ref 0.44–1.00)
Chloride: 101 mmol/L (ref 101–111)
GFR calc non Af Amer: 22 mL/min — ABNORMAL LOW (ref >60)
GFR, EST AFRICAN AMERICAN: 26 mL/min — AB (ref >60)
GLUCOSE: 111 mg/dL — AB (ref 65–99)
Potassium: 4.5 mmol/L (ref 3.5–5.1)
SODIUM: 134 mmol/L — AB (ref 135–145)
Total Bilirubin: 1.6 mg/dL — ABNORMAL HIGH (ref 0.3–1.2)
Total Protein: 5.5 g/dL — ABNORMAL LOW (ref 6.5–8.1)

## 2015-05-26 LAB — TROPONIN I: Troponin I: 0.56 ng/mL (ref <0.031)

## 2015-05-26 LAB — I-STAT TROPONIN, ED: Troponin i, poc: 0.31 ng/mL (ref 0.00–0.08)

## 2015-05-26 LAB — CBC
HCT: 18.2 % — ABNORMAL LOW (ref 36.0–46.0)
HEMOGLOBIN: 5.5 g/dL — AB (ref 12.0–15.0)
MCH: 20.7 pg — ABNORMAL LOW (ref 26.0–34.0)
MCHC: 30.2 g/dL (ref 30.0–36.0)
MCV: 68.4 fL — ABNORMAL LOW (ref 78.0–100.0)
Platelets: 124 10*3/uL — ABNORMAL LOW (ref 150–400)
RBC: 2.66 MIL/uL — ABNORMAL LOW (ref 3.87–5.11)
RDW: 18.8 % — AB (ref 11.5–15.5)
WBC: 4.4 10*3/uL (ref 4.0–10.5)

## 2015-05-26 LAB — RETICULOCYTES
RBC.: 3.11 MIL/uL — ABNORMAL LOW (ref 3.87–5.11)
RETIC CT PCT: 1 % (ref 0.4–3.1)
Retic Count, Absolute: 31.1 10*3/uL (ref 19.0–186.0)

## 2015-05-26 LAB — BRAIN NATRIURETIC PEPTIDE: B Natriuretic Peptide: 1485.7 pg/mL — ABNORMAL HIGH (ref 0.0–100.0)

## 2015-05-26 LAB — PREPARE RBC (CROSSMATCH)

## 2015-05-26 MED ORDER — ASPIRIN EC 81 MG PO TBEC
81.0000 mg | DELAYED_RELEASE_TABLET | Freq: Every day | ORAL | Status: DC
  Administered 2015-05-27 – 2015-05-28 (×2): 81 mg via ORAL
  Filled 2015-05-26 (×2): qty 1

## 2015-05-26 MED ORDER — PSEUDOEPHEDRINE-GUAIFENESIN ER 60-600 MG PO TB12: 1.0000 | ORAL_TABLET | Freq: Every day | ORAL | Status: DC

## 2015-05-26 MED ORDER — TRAZODONE HCL 50 MG PO TABS: 25.0000 mg | ORAL_TABLET | Freq: Every evening | ORAL | Status: DC | PRN

## 2015-05-26 MED ORDER — ROSUVASTATIN CALCIUM 10 MG PO TABS
10.0000 mg | ORAL_TABLET | Freq: Every day | ORAL | Status: DC
  Administered 2015-05-27 – 2015-05-28 (×2): 10 mg via ORAL
  Filled 2015-05-26 (×2): qty 1

## 2015-05-26 MED ORDER — PANTOPRAZOLE SODIUM 40 MG IV SOLR
40.0000 mg | Freq: Two times a day (BID) | INTRAVENOUS | Status: DC
  Administered 2015-05-26 – 2015-05-27 (×4): 40 mg via INTRAVENOUS
  Filled 2015-05-26 (×4): qty 40

## 2015-05-26 MED ORDER — BISACODYL 10 MG RE SUPP: 10.0000 mg | Freq: Every day | RECTAL | Status: DC | PRN

## 2015-05-26 MED ORDER — ONDANSETRON HCL 4 MG PO TABS: 4.0000 mg | ORAL_TABLET | Freq: Four times a day (QID) | ORAL | Status: DC | PRN

## 2015-05-26 MED ORDER — GUAIFENESIN ER 600 MG PO TB12
600.0000 mg | ORAL_TABLET | Freq: Two times a day (BID) | ORAL | Status: DC
  Administered 2015-05-26 – 2015-05-28 (×4): 600 mg via ORAL
  Filled 2015-05-26 (×4): qty 1

## 2015-05-26 MED ORDER — TIMOLOL MALEATE 0.5 % OP SOLN
1.0000 [drp] | Freq: Every day | OPHTHALMIC | Status: DC
  Administered 2015-05-27: 1 [drp] via OPHTHALMIC
  Filled 2015-05-26: qty 5

## 2015-05-26 MED ORDER — ALUM & MAG HYDROXIDE-SIMETH 200-200-20 MG/5ML PO SUSP: 30.0000 mL | Freq: Four times a day (QID) | ORAL | Status: DC | PRN

## 2015-05-26 MED ORDER — NEBIVOLOL HCL 10 MG PO TABS
10.0000 mg | ORAL_TABLET | Freq: Every day | ORAL | Status: DC
  Administered 2015-05-27 – 2015-05-28 (×2): 10 mg via ORAL
  Filled 2015-05-26: qty 1
  Filled 2015-05-26: qty 4
  Filled 2015-05-26 (×3): qty 1
  Filled 2015-05-26: qty 4

## 2015-05-26 MED ORDER — LEVETIRACETAM 250 MG PO TABS
250.0000 mg | ORAL_TABLET | Freq: Two times a day (BID) | ORAL | Status: DC
  Administered 2015-05-26 – 2015-05-28 (×4): 250 mg via ORAL
  Filled 2015-05-26 (×4): qty 1

## 2015-05-26 MED ORDER — LATANOPROST 0.005 % OP SOLN
1.0000 [drp] | Freq: Every day | OPHTHALMIC | Status: DC
  Administered 2015-05-27: 1 [drp] via OPHTHALMIC
  Filled 2015-05-26: qty 2.5

## 2015-05-26 MED ORDER — ACETAMINOPHEN 325 MG PO TABS
650.0000 mg | ORAL_TABLET | Freq: Four times a day (QID) | ORAL | Status: DC | PRN
  Administered 2015-05-27: 650 mg via ORAL
  Filled 2015-05-26: qty 2

## 2015-05-26 MED ORDER — LORATADINE 10 MG PO TABS
10.0000 mg | ORAL_TABLET | Freq: Every day | ORAL | Status: DC
  Administered 2015-05-26 – 2015-05-28 (×3): 10 mg via ORAL
  Filled 2015-05-26 (×3): qty 1

## 2015-05-26 MED ORDER — SODIUM CHLORIDE 0.9 % IV SOLN
10.0000 mL/h | Freq: Once | INTRAVENOUS | Status: AC
  Administered 2015-05-26: 10 mL/h via INTRAVENOUS

## 2015-05-26 MED ORDER — ONDANSETRON HCL 4 MG/2ML IJ SOLN: 4.0000 mg | Freq: Four times a day (QID) | INTRAMUSCULAR | Status: DC | PRN

## 2015-05-26 MED ORDER — ASPIRIN 81 MG PO CHEW
324.0000 mg | CHEWABLE_TABLET | Freq: Once | ORAL | Status: AC
  Administered 2015-05-26: 324 mg via ORAL
  Filled 2015-05-26: qty 4

## 2015-05-26 MED ORDER — SODIUM CHLORIDE 0.9% FLUSH
3.0000 mL | Freq: Two times a day (BID) | INTRAVENOUS | Status: DC
  Administered 2015-05-26 – 2015-05-27 (×3): 3 mL via INTRAVENOUS
  Administered 2015-05-27: 10 mL via INTRAVENOUS

## 2015-05-26 MED ORDER — ACETAMINOPHEN 650 MG RE SUPP: 650.0000 mg | Freq: Four times a day (QID) | RECTAL | Status: DC | PRN

## 2015-05-26 MED ORDER — MORPHINE SULFATE (PF) 2 MG/ML IV SOLN: 1.0000 mg | INTRAVENOUS | Status: DC | PRN

## 2015-05-26 MED ORDER — SODIUM CHLORIDE 0.9 % IV SOLN
INTRAVENOUS | Status: DC
  Administered 2015-05-26: 21:00:00 via INTRAVENOUS

## 2015-05-26 MED ORDER — HYDROCODONE-ACETAMINOPHEN 5-325 MG PO TABS: 1.0000 | ORAL_TABLET | ORAL | Status: DC | PRN

## 2015-05-26 NOTE — ED Notes (Signed)
Aware of abnormal labs and getting pt back next to a room

## 2015-05-26 NOTE — ED Notes (Signed)
Pt was sent here by her PCP for evaluation of weakness and SOb that started on 3/4. Pt reported to be unsteady on her feel per family.

## 2015-05-26 NOTE — Consult Note (Signed)
CARDIOLOGY CONSULT NOTE   Patient ID: Anne Hanna MRN: [id number] DOB/AGE: 1922-09-16 82 y.o.  Admit date: 05/26/2015  Primary Physician   [name], MD Primary Cardiologist   Dr. Swaziland Reason for Consultation   Elevated troponin  Requesting Physician  Dr. Konrad Dolores  HPI: Anne Hanna is a 82 y.o. 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   She was doing well when last seen by Dr. Swaziland 05/03/15. She was in USOH until Saturday 3/4 when she has noted shortness of breath that has been progressively gotten worse.  Yesterday she had episode of dizziness when she felt that she is going to pass out. No syncope. Today she went to PCP office for evaluation where she was barely able to walk and had a severe sob and sent to ED for further evaluation. She was able to do house hold chose without any difficulty. She cooks by herself. The patient denies nausea, vomiting, fever, chest pain, palpitations,  orthopnea, PND, syncope, cough, congestion, abdominal pain, hematochezia, melena, lower extremity edema.  In ED, poc troponin of 0.31. Hgb of 5.5, RBC of 2.66. Scr of 1.93. Anion gap of 17. CXR showed mild vascular congestion. Stool guiac negative. EKG showed NSR with ST segment flattening in inferior lead and TWI in lateral leads. Plan for blood transfusion. Daughter at bedside.   Echo 03/06/12 showed LV EF of 60%, possible mild hypokinesis of the base-inferior wall. PA peak pressure of 37mm HG.   Past Medical History  Diagnosis Date  . Coronary artery disease     STATUS POST CABG  . Hypertension   . Hypercholesterolemia   . CVA (cerebral vascular accident) (HCC)    OCULAR CVA  . Hx: UTI (urinary tract infection)   . Glaucoma     History of glaucoma  . Bronchopneumonia     Right lung  . Urinary tract infection     Escherichia coli urinary tract infection  . Acute sinusitis   . Thrombocytopenia (HCC) 2010     platelet count of 96 at discharge (95 on admission)  . Hyperlipidemia   . History of transient ischemic attack   . Hyponatremia     volume depletion - resolved  . Volume overload     History of postoperative volume overload  . Delirium     Postoperative delirium  . Cataract   . CVA (cerebral infarction)     temporal hemorhage  . Gallstones   . Hepatic cyst      Past Surgical History  Procedure Laterality Date  . Cardiac catheterization  10/05/2006    NORMAL LEFT VENTRICULAR SIZE. THERE IS MODERATE GLOBAL HYPOKINESIA WITH OVERALL EF 40%  . Coronary artery bypass graft  09/2006    LIMA GRAFT TO THE LAD, SAPHENOUS VEIN GRAFT TO THE FIRST OBTUSE MARGINAL VESSEL WITH SEQUENTIAL GRAFT TO THE SECOND MARGINAL VESSEL , AND SAPHENOUS VEIN GRAFT TO THE PDA  . Cataract surgery      Bilateral cataract extraction-- x2  . Tendon repair      Status post repair of tendon of the left index finger  . Esophagogastroduodenoscopy N/A 04/22/2013    Procedure: ESOPHAGOGASTRODUODENOSCOPY (EGD);  Surgeon: Willis Modena, MD;  Location: [facility] ENDOSCOPY;  Service:  Endoscopy;  Laterality: N/A;    Allergies  Allergen Reactions  . Dilantin [Phenytoin]   . Amlodipine Swelling  . Lipitor [Atorvastatin Calcium]     HEADACHES    I have reviewed the patient's current medications . aspirin  324 mg Oral Once  . aspirin  81 mg Oral Daily  . latanoprost  1 drop Both Eyes QHS  . levETIRAcetam  250 mg Oral BID  . loratadine  10 mg Oral Daily  . nebivolol  10 mg Oral Daily  . pantoprazole (PROTONIX) IV  40 mg Intravenous Q12H  . pseudoephedrine-guaifenesin  1 tablet Oral Daily  . rosuvastatin  10 mg Oral Daily  . sodium chloride flush  3 mL Intravenous Q12H  .  Timolol Maleate  1 drop Both Eyes Daily   . sodium chloride    . sodium chloride     acetaminophen **OR** acetaminophen, alum & mag hydroxide-simeth, bisacodyl, HYDROcodone-acetaminophen, morphine injection, ondansetron **OR** ondansetron (ZOFRAN) IV, traZODone  Prior to Admission medications   Medication Sig Start Date End Date Taking? Authorizing Provider  aspirin 81 MG tablet Take 81 mg by mouth daily.   Yes Historical Provider, MD  hydrochlorothiazide 25 MG tablet Take 25 mg by mouth daily.     Yes Historical Provider, MD  LATANOPROST OP Apply 1 drop to eye daily. One drop in each eye daily.   Yes Historical Provider, MD  levETIRAcetam (KEPPRA) 250 MG tablet Take 1 tablet (250 mg total) by mouth 2 (two) times daily. 11/04/14  Yes Nilda Riggs, NP  loratadine (CLARITIN) 10 MG tablet Take 10 mg by mouth daily.   Yes Historical Provider, MD  nebivolol (BYSTOLIC) 10 MG tablet Take 10 mg by mouth daily.   Yes Historical Provider, MD  omeprazole (PRILOSEC) 20 MG capsule Take 20 mg by mouth daily. Take 1 tab by mouth daily 04/12/15  Yes Historical Provider, MD  pseudoephedrine-guaifenesin (MUCINEX D) 60-600 MG 12 hr tablet Take 1 tablet by mouth daily.   Yes Historical Provider, MD  rosuvastatin (CRESTOR) 10 MG tablet Take 1 tablet (10 mg total) by mouth daily. 02/22/15  Yes Peter M Swaz[place]and, MD  Timolol Maleate (ISTALOL) 0.5 % (DAILY) SOLN Place 1 drop into both eyes daily.   Yes Historical Provider, MD  nitroGLYCERIN (NITROSTAT) 0.4 MG SL tablet Place 1 tablet (0.4 mg total) under the tongue every 5 (five) minutes as needed. 04/12/12   Peter M Swaz[place]and, MD     Social History   Social History  . Marital Status: Widowed    Spouse Name: N/A  . Number of Children: 5  . Years of Education: 16   Occupational History  . business office    Social History Main Topics  . Smoking status: Never Smoker   . Smokeless tobacco: Never Used  . Alcohol Use: No  . Drug Use: No  . Sexual Activity:  Not on file   Other Topics Concern  . Not on file   Social History Narrative   Patient is a widow and lives at home alone.   Patient has 5 children.   Patient is retired.   Patient has a college education.   Patient is right-handed.   Patient drinks two cups of coffee every morning.          Family Status  Relation Status Death Age  . Mother Deceased 74's  . Father Deceased 69's  . Sister Alive   . Brother Deceased   . Sister Alive   . Sister  Alive   . Brother Deceased 36    Died of unknown causes  . Brother Alive     Bleeding ulcers   Family History  Problem Relation Age of Onset  . Heart failure Mother   . Heart failure Father   . Heart disease Brother       ROS:  Full 14 point review of systems complete and found to be negative unless listed above.  Physical Exam: Blood pressure 114/54, pulse 82, temperature 97.4 F (36.3 C), temperature source Oral, resp. rate 19, SpO2 99 %.  General: elderly pale  female in no acute distress Head: Eyes PERRLA, No xanthomas. Normocephalic and atraumatic, oropharynx without edema or exudate.  Lungs: Resp regular and unlabored, CTA. Heart: RRR no s3, s4, or murmurs.   Neck: No carotid bruits. No lymphadenopathy. No JVD. Abdomen: Bowel sounds present, abdomen soft and non-tender without masses or hernias noted. Msk:  No spine or cva tenderness. No weakness, no joint deformities or effusions. Extremities: No clubbing, cyanosis or edema. DP/PT/Radials 2+ and equal bilaterally. Neuro: Alert and oriented X 3. No focal deficits noted. Psych:  Good affect, responds appropriately Skin: No rashes or lesions noted.  Labs:   Lab Results  Component Value Date   WBC 4.4 05/26/2015   HGB 5.5* 05/26/2015   HCT 18.2* 05/26/2015   MCV 68.4* 05/26/2015   PLT 124* 05/26/2015   No results for input(s): INR in the last 72 hours.   Recent Labs Lab 05/26/15 1300  NA 132*  K 5.0  CL 99*  CO2 16*  BUN 41*  CREATININE 1.93*  CALCIUM  10.2  GLUCOSE 115*   MAGNESIUM  Date Value Ref Range Status  03/10/2012 1.2* 1.5 - 2.5 mg/dL Final   No results for input(s): CKTOTAL, CKMB, TROPONINI in the last 72 hours.  Recent Labs  05/26/15 1313  TROPIPOC 0.31*   No results found for: PROBNP No results found for: CHOL, HDL, LDLCALC, TRIG Lab Results  Component Value Date   DDIMER * 06/21/2008    0.87        AT THE INHOUSE ESTABLISHED CUTOFF VALUE OF 0.48 ug/mL FEU, THIS ASSAY HAS BEEN DOCUMENTED IN THE LITERATURE TO HAVE A SENSITIVITY AND NEGATIVE PREDICTIVE VALUE OF AT LEAST 98 TO 99%.  THE TEST RESULT SHOULD BE CORRELATED WITH AN ASSESSMENT OF THE CLINICAL PROBABILITY OF DVT / VTE.   No results found for: LIPASE, AMYLASE No results found for: TSH, T4TOTAL, T3FREE, THYROIDAB No results found for: VITAMINB12, FOLATE, FERRITIN, TIBC, IRON, RETICCTPCT  Echo: 03/06/12 LV EF: 60%  ------------------------------------------------------------ Indications:   CVA 436.  ------------------------------------------------------------ History:  PMH:  Coronary artery disease. Risk factors: Hypertension.  ------------------------------------------------------------ Study Conclusions  - Left ventricle: Possible mild hypokinesis of the base-inferior wall. The cavity size was normal. Wall thickness was normal. The estimated ejection fraction was 60%. - Right ventricle: Systolic function was mildly reduced. - Pulmonary arteries: PA peak pressure: 37mm Hg (S). - Impressions: No cardiac source of embolism was identified, but cannot be ruled out on the basis of this examination. Impressions:  ECG:  Vent. rate 83 BPM PR interval 176 ms QRS duration 94 ms QT/QTc 376/441 ms P-R-T axes 80 99 168  Radiology:  Dg Chest Portable 1 View  05/26/2015  CLINICAL DATA:  Weakness and shortness of breath for 4 days EXAM: PORTABLE CHEST 1 VIEW COMPARISON:  03/11/2012 FINDINGS: Previous median sternotomy and CABG  procedure. There are small bilateral pleural effusions. Advanced coarsened interstitial changes of  COPD identified. No superimposed airspace consolidation. IMPRESSION: 1. Mild CHF. Electronically Signed   By: Signa Kellaylor  Stroud M.D.   On: 05/26/2015 15:14    ASSESSMENT AND PLAN:     Wynelle BeckmannMary F Pickard is a 82 y.o. 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  1. Elevated troponin - in setting of severe anemia and acute kidney injury. No angina pain. Her SOB most likely due to anemia. EKG with new changes. Given age and acute comorbidity will not pursue with invasive study. Continue cycle troponin and serial EKGs. The patient and daughter interested in medical therapy.   2. CAD s/p CABG 2008 - Echo 03/06/12 showed LV EF of 60%, possible mild hypokinesis of the base-inferior wall. PA peak pressure of 37mm HG.  - No anginal pain. Continue statin and BB. ASA management per primary.   3. Symptomatic Anemia - Evaluation per primary. Negative stool guaiac.  4. Mild CHF on CXR - Pending BMP. Appears euvolemic. Held HCTZ. Watch closely for volume overload given transfusion and gentle hydration.     Otherwise per primary   Hypertension   Hypercholesterolemia   Elevated troponin   Dyspnea   Symptomatic anemia   Acute kidney injury [facility](HCC)   SignedManson Passey: [place] Fant, PA 05/26/2015, 3:43 PM Pager [phone]  Co-Sign MD

## 2015-05-26 NOTE — ED Notes (Signed)
Signed consent for blood transfusion at bedside. Aware of risks/benefits of blood transfusion. Pt daughter/POA signed for pt per pt request. No questions/concerns at this time.

## 2015-05-26 NOTE — H&P (Signed)
[facility] History and Physical  Anne Hanna ZOX:[id number] DOB: [birth year] DOA: 05/26/2015  Referring physician: Dalene Seltzer PCP: [name], MD   Chief Complaint: DOE/generalized  HPI: Anne Hanna is a very pleasant  82 y.o. female with a past medical history CAD, hypertension, CVA, thrombocytopenia, hyperlipidemia, hyponatremia, hepatic cysts presents to the emergency department from her PCP office with a chief complaint of dyspnea with exertion/generalized weakness and a abnormal hemoglobin. Initial evaluation reveals hemoglobin of 5.5, elevated troponin, FOBT is negative, acute kidney injury.   Information is obtained from the patient and her daughter who is at the bedside. She reports that her days ago she developed sudden dyspnea with exertion. Was out shopping and suddenly felt like she was going to "collapse". She denies feeling dizzy or the sensation that she was given a pass out she states her legs just wouldn't hold her up. She denies headache dizziness chest pain palpitation abdominal pain nausea vomiting diaphoresis. She denies any diarrhea constipation melena or bright red blood per rectum. She denies fever chills recent travel or sick contacts. She denies dysuria hematuria frequency or urgency. States she's been eating her normal amount and denies unintentional weight loss. Today she went to her PCP referred her to the emergency department due to abnormal lab work and hypoxia.  In the emergency department she's hemodynamically stable afebrile and not hypoxic at rest.  Review of Systems:  And point review of systems complete and all systems are negative except as indicated in the history of present illness   Past Medical History  Diagnosis Date  . Coronary artery disease     STATUS POST CABG  . Hypertension   . Hypercholesterolemia   . CVA (cerebral vascular accident) (HCC)     OCULAR CVA  . Hx: UTI (urinary tract infection)   . Glaucoma     History of  glaucoma  . Bronchopneumonia     Right lung  . Urinary tract infection     Escherichia coli urinary tract infection  . Acute sinusitis   . Thrombocytopenia (HCC) 2010     platelet count of 96 at discharge (95 on admission)  . Hyperlipidemia   . History of transient ischemic attack   . Hyponatremia     volume depletion - resolved  . Volume overload     History of postoperative volume overload  . Delirium     Postoperative delirium  . Cataract   . CVA (cerebral infarction)     temporal hemorhage  . Gallstones   . Hepatic cyst   . Cirrhosis (HCC)     per endo 2015   Past Surgical History  Procedure Laterality Date  . Cardiac catheterization  10/05/2006    NORMAL LEFT VENTRICULAR SIZE. THERE IS MODERATE GLOBAL HYPOKINESIA WITH OVERALL EF 40%  . Coronary artery bypass graft  09/2006    LIMA GRAFT TO THE LAD, SAPHENOUS VEIN GRAFT TO THE FIRST OBTUSE MARGINAL VESSEL WITH SEQUENTIAL GRAFT TO THE SECOND MARGINAL VESSEL , AND SAPHENOUS VEIN GRAFT TO THE PDA  . Cataract surgery      Bilateral cataract extraction-- x2  . Tendon repair      Status post repair of tendon of the left index finger  . Esophagogastroduodenoscopy N/A 04/22/2013    Procedure: ESOPHAGOGASTRODUODENOSCOPY (EGD);  Surgeon: Willis Modena, MD;  Location: [facility] ENDOSCOPY;  Service: Endoscopy;  Laterality: N/A;   Social History:  reports that she has never smoked. She has never used smokeless tobacco. She reports that she does not  drink alcohol or use illicit drugs. She lives at home alone she has a daughter who takes care of her during the day. She also has adult. She is a former drinker but hasn't had a drink in 15 years she has never smoked and under drugs. Fairly independent with ADLs Allergies  Allergen Reactions  . Dilantin [Phenytoin]   . Amlodipine Swelling  . Lipitor [Atorvastatin Calcium]     HEADACHES    Family History  Problem Relation Age of Onset  . Heart failure Mother   . Heart failure Father   .  Heart disease Brother      Prior to Admission medications   Medication Sig Start Date End Date Taking? Authorizing Provider  aspirin 81 MG tablet Take 81 mg by mouth daily.   Yes Historical Provider, MD  hydrochlorothiazide 25 MG tablet Take 25 mg by mouth daily.     Yes Historical Provider, MD  LATANOPROST OP Apply 1 drop to eye daily. One drop in each eye daily.   Yes Historical Provider, MD  levETIRAcetam (KEPPRA) 250 MG tablet Take 1 tablet (250 mg total) by mouth 2 (two) times daily. 11/04/14  Yes Nilda Riggs, NP  loratadine (CLARITIN) 10 MG tablet Take 10 mg by mouth daily.   Yes Historical Provider, MD  nebivolol (BYSTOLIC) 10 MG tablet Take 10 mg by mouth daily.   Yes Historical Provider, MD  omeprazole (PRILOSEC) 20 MG capsule Take 20 mg by mouth daily. Take 1 tab by mouth daily 04/12/15  Yes Historical Provider, MD  pseudoephedrine-guaifenesin (MUCINEX D) 60-600 MG 12 hr tablet Take 1 tablet by mouth daily.   Yes Historical Provider, MD  rosuvastatin (CRESTOR) 10 MG tablet Take 1 tablet (10 mg total) by mouth daily. 02/22/15  Yes Peter M Swaziland, MD  Timolol Maleate (ISTALOL) 0.5 % (DAILY) SOLN Place 1 drop into both eyes daily.   Yes Historical Provider, MD  nitroGLYCERIN (NITROSTAT) 0.4 MG SL tablet Place 1 tablet (0.4 mg total) under the tongue every 5 (five) minutes as needed. 04/12/12   Peter M Sw[place]il[place], MD   Physical Exam: Filed Vitals:   05/26/15 1249 05/26/15 1430 05/26/15 1445 05/26/15 1546  BP: 119/46 129/60 114/54 120/69  Pulse: 83 83 82 80  Temp: 97.4 F (36.3 C)   97.6 F (36.4 C)  TempSrc: Oral   Oral  Resp: SpO2: 100% 100% 99% 100%    Wt Readings from Last 3 Encounters:  05/03/15 56.841 kg (125 lb 5 oz)  11/04/14 59.693 kg (131 lb 9.6 oz)  10/05/14 57.607 kg (127 lb)    General:  Appears calm and comfortable Eyes: PERRL, normal lids, No scleral icterus mucous membranes very pale ENT: grossly normal hearing, lips & tongue, mucous membranes of  her mouth slightly pale slightly dry Neck: no LAD, masses or thyromegaly Cardiovascular: RRR, no m/r/g. No LE edema. Pedal pulses present and palpable Respiratory: CTA bilaterally, no w/r/r. Normal respiratory effort. Abdomen: soft, ntnd distended positive bowel sounds no guarding or rebounding Skin: no rash or induration seen on limited exam Musculoskeletal: grossly normal tone BUE/BLE Psychiatric: grossly normal mood and affect, speech fluent and appropriate Neurologic: grossly non-focal. Speech clear somewhat slow cranial nerves II through XII grossly intact           Labs on Admission:  Basic Metabolic Panel:  Recent Labs Lab 05/26/15 1300  NA 132*  K 5.0  CL 99*  CO2 16*  GLUCOSE 115*  BUN 41*  CREATININE 1.93*  CALCIUM 10.2   Liver Function Tests: No results for input(s): AST, ALT, ALKPHOS, BILITOT, PROT, ALBUMIN in the last 168 hours. No results for input(s): LIPASE, AMYLASE in the last 168 hours. No results for input(s): AMMONIA in the last 168 hours. CBC:  Recent Labs Lab 05/26/15 1300  WBC 4.4  HGB 5.5*  HCT 18.2*  MCV 68.4*  PLT 124*   Cardiac Enzymes: No results for input(s): CKTOTAL, CKMB, CKMBINDEX, TROPONINI in the last 168 hours.  BNP (last 3 results) No results for input(s): BNP in the last 8760 hours.  ProBNP (last 3 results) No results for input(s): PROBNP in the last 8760 hours.  CBG: No results for input(s): GLUCAP in the last 168 hours.  Radiological Exams on Admission: Dg Chest Portable 1 View  05/26/2015  CLINICAL DATA:  Weakness and shortness of breath for 4 days EXAM: PORTABLE CHEST 1 VIEW COMPARISON:  03/11/2012 FINDINGS: Previous median sternotomy and CABG procedure. There are small bilateral pleural effusions. Advanced coarsened interstitial changes of COPD identified. No superimposed airspace consolidation. IMPRESSION: 1. Mild CHF. Electronically Signed   By: Signa Kellaylor  Stroud M.D.   On: 05/26/2015 15:14    EKG: Independently  reviewed. Normal sinus rhythm Rightward axis ST & T wave abnormality, consider inferolateral ischemia Abnormal ECG  Assessment/Plan Principal Problem:   Anemia Active Problems:   Coronary artery disease   Hypertension   Hypercholesterolemia   Elevated troponin   Dyspnea   Symptomatic anemia   Acute kidney injury (HCC)   Jaundice  #1. Anemia. Microcytic. Symptomatic. Hemoglobin 5.5 on admission. Etiology uncertain. Chart review indicates 3 years ago hemoglobin 9.5. She has a history of esophageal varices. Endoscopy in 2015 showed gastric varices. FOBT in the ED negative. Denies melena or bright red blood per rectum. No blood thinners. Aspirin 81 mg -Admit to telemetry -Transfuse 2 units of packed red blood cells -. FOBT tomorrow -Serial CBC -Anemia panel  #2. Elevated troponin. Likely related to above. Hx CAD. Troponin 0.31 on admission. CABG in 2008. No chest pain no palpitations. EKG as above. History of CAD. Home medications include Crestor low-dose aspirin.  -Cycle troponins -Monitor on telemetry -Continue beta blocker -Cardiology consult pending  #3. Acute kidney injury. Creatinine 1.93 on admission. Review indicates most recent lab work 3 years ago and creatinine within the limits of normal -We will hold nephrotoxins -Monitor urine output -Gentle IV fluids -Recheck in the morning  4. Dyspnea with exertion. Likely related to above. Chest x-ray does show mild CHF. Home medications include hydrochlorothiazide, Bystolic. Echo 2013 the 60% possible mild hypokinesis with systolic function mildly reduced. -Transfusion as noted above -Holding HCTZ for now -Obtain a BNP -Monitor daily weights  #5. Jaundice. Chart review inidicates endoscopy 2015 with small gastric varices and mention of cirrhosis. GI Recommended follow up in 3-4 months for management of cirrhosis. Hx of etoh - obtain CMET    cardiology  Code Status: DNR DVT Prophylaxis: Family Communication: daughter at  bedside Disposition Plan: home when ready  Time spent: 75 minutes  [place] Psychiatric Health FacilityBLACK,KAREN M [facility]

## 2015-05-26 NOTE — ED Provider Notes (Signed)
CSN: [id number]     Arrival date & time 05/26/15  1234 History   First MD Initiated Contact with Patient 05/26/15 1424     Chief Complaint  Patient presents with  . Shortness of Breath  . Weakness     (Consider location/radiation/quality/duration/timing/severity/associated sxs/prior Treatment) HPI Comments: Saturday, felt dyspnea on exertion while walking in grocery store, occurred suddenly, severe  No CP, nausea, diaphoresis Rested and felt better Now increased dyspnea, not as severe as Saturday, however having dyspnea and dyspnea on exertion Family notes she is unsteady on her feet   Patient is a 82 y.o. female presenting with shortness of breath and weakness.  Shortness of Breath Associated symptoms: no abdominal pain, no chest pain, no cough, no fever, no headaches, no neck pain, no rash, no sore throat and no vomiting   Weakness Associated symptoms include shortness of breath. Pertinent negatives include no chest pain, no abdominal pain and no headaches.    Past Medical History  Diagnosis Date  . Coronary artery disease     STATUS POST CABG  . Hypertension   . Hypercholesterolemia   . CVA (cerebral vascular accident) (HCC)     OCULAR CVA  . Hx: UTI (urinary tract infection)   . Glaucoma     History of glaucoma  . Bronchopneumonia     Right lung  . Urinary tract infection     Escherichia coli urinary tract infection  . Acute sinusitis   . Thrombocytopenia (HCC) 2010     platelet count of 96 at discharge (95 on admission)  . Hyperlipidemia   . History of transient ischemic attack   . Hyponatremia     volume depletion - resolved  . Volume overload     History of postoperative volume overload  . Delirium     Postoperative delirium  . Cataract   . CVA (cerebral infarction)     temporal hemorhage  . Gallstones   . Hepatic cyst   . Cirrhosis (HCC)     per endo 2015   Past Surgical History  Procedure Laterality Date  . Cardiac catheterization  10/05/2006     NORMAL LEFT VENTRICULAR SIZE. THERE IS MODERATE GLOBAL HYPOKINESIA WITH OVERALL EF 40%  . Coronary artery bypass graft  09/2006    LIMA GRAFT TO THE LAD, SAPHENOUS VEIN GRAFT TO THE FIRST OBTUSE MARGINAL VESSEL WITH SEQUENTIAL GRAFT TO THE SECOND MARGINAL VESSEL , AND SAPHENOUS VEIN GRAFT TO THE PDA  . Cataract surgery      Bilateral cataract extraction-- x2  . Tendon repair      Status post repair of tendon of the left index finger  . Esophagogastroduodenoscopy N/A 04/22/2013    Procedure: ESOPHAGOGASTRODUODENOSCOPY (EGD);  Surgeon: Willis ModenaWilliam Outlaw, MD;  [facility];  Service: Endoscopy;  Laterality: N/A;   Family History  Problem Relation Age of Onset  . Heart failure Mother   . Heart failure Father   . Heart disease Brother    Social History  Substance Use Topics  . Smoking status: Never Smoker   . Smokeless tobacco: Never Used  . Alcohol Use: No   OB History    No data available     Review of Systems  Constitutional: Positive for fatigue. Negative for fever.  HENT: Negative for sore throat.   Eyes: Negative for visual disturbance.  Respiratory: Positive for shortness of breath. Negative for cough.   Cardiovascular: Negative for chest pain.  Gastrointestinal: Negative for nausea, vomiting, abdominal pain and blood in stool.  Genitourinary: Negative for vaginal bleeding and difficulty urinating.  Musculoskeletal: Negative for back pain and neck pain.  Skin: Negative for rash.  Neurological: Positive for weakness. Negative for syncope and headaches.      Allergies  Dilantin; Amlodipine; and Lipitor  Home Medications   Prior to Admission medications   Medication Sig Start Date End Date Taking? Authorizing Provider  aspirin 81 MG tablet Take 81 mg by mouth daily.   Yes Historical Provider, MD  hydrochlorothiazide 25 MG tablet Take 25 mg by mouth daily.     Yes Historical Provider, MD  LATANOPROST OP Apply 1 drop to eye daily. One drop in each eye daily.   Yes  Historical Provider, MD  levETIRAcetam (KEPPRA) 250 MG tablet Take 1 tablet (250 mg total) by mouth 2 (two) times daily. 11/04/14  Yes Nilda Riggs, NP  loratadine (CLARITIN) 10 MG tablet Take 10 mg by mouth daily.   Yes Historical Provider, MD  nebivolol (BYSTOLIC) 10 MG tablet Take 10 mg by mouth daily.   Yes Historical Provider, MD  omeprazole (PRILOSEC) 20 MG capsule Take 20 mg by mouth daily. Take 1 tab by mouth daily 04/12/15  Yes Historical Provider, MD  pseudoephedrine-guaifenesin (MUCINEX D) 60-600 MG 12 hr tablet Take 1 tablet by mouth daily.   Yes Historical Provider, MD  rosuvastatin (CRESTOR) 10 MG tablet Take 1 tablet (10 mg total) by mouth daily. 02/22/15  Yes Peter M [place], MD  Timolol Maleate (ISTALOL) 0.5 % (DAILY) SOLN Place 1 drop into both eyes daily.   Yes Historical Provider, MD  nitroGLYCERIN (NITROSTAT) 0.4 MG SL tablet Place 1 tablet (0.4 mg total) under the tongue every 5 (five) minutes as needed. 04/12/12   Peter M [place], MD   BP 127/57 mmHg  Pulse 82  Temp(Src) 97.6 F (36.4 C) (Oral)  Resp 18  SpO2 98% Physical Exam  Constitutional: She is oriented to person, place, and time. She appears well-developed and well-nourished. No distress.  HENT:  Head: Normocephalic and atraumatic.  Eyes: Conjunctivae and EOM are normal.  Conjunctival pallor  Neck: Normal range of motion.  Cardiovascular: Normal rate, regular rhythm and intact distal pulses.  Exam reveals no gallop and no friction rub.   Murmur heard. Pulmonary/Chest: Effort normal and breath sounds normal. No respiratory distress. She has no wheezes. She has no rales.  Abdominal: Soft. She exhibits no distension. There is no tenderness. There is no guarding.  Genitourinary: Guaiac negative stool.  Musculoskeletal: She exhibits no edema or tenderness.  Neurological: She is alert and oriented to person, place, and time.  Skin: Skin is warm and dry. No rash noted. She is not diaphoretic. No erythema. There  is pallor.  Nursing note and vitals reviewed.   ED Course  Procedures (including critical care time) Labs Review Labs Reviewed  BASIC METABOLIC PANEL - Abnormal; Notable for the following:    Sodium 132 (*)    Chloride 99 (*)    CO2 16 (*)    Glucose, Bld 115 (*)    BUN 41 (*)    Creatinine, Ser 1.93 (*)    GFR calc non Af Amer 22 (*)    GFR calc Af Amer 25 (*)    Anion gap 17 (*)    All other components within normal limits  CBC - Abnormal; Notable for the following:    RBC 2.66 (*)    Hemoglobin 5.5 (*)    HCT 18.2 (*)    MCV 68.4 (*)  MCH 20.7 (*)    RDW 18.8 (*)    Platelets 124 (*)    All other components within normal limits  I-STAT TROPOININ, ED - Abnormal; Notable for the following:    Troponin i, poc 0.31 (*)    All other components within normal limits  URINALYSIS, ROUTINE W REFLEX MICROSCOPIC (NOT AT [facility])  TROPONIN I  VITAMIN B12  FOLATE  IRON AND TIBC  FERRITIN  RETICULOCYTES  COMPREHENSIVE METABOLIC PANEL  BRAIN NATRIURETIC PEPTIDE  BASIC METABOLIC PANEL  CBC  TROPONIN I  OCCULT BLOOD X 1 CARD TO LAB, STOOL  CBG MONITORING, ED  POC OCCULT BLOOD, ED  PREPARE RBC (CROSSMATCH)  TYPE AND SCREEN    Imaging Review Dg Chest Portable 1 View  05/26/2015  CLINICAL DATA:  Weakness and shortness of breath for 4 days EXAM: PORTABLE CHEST 1 VIEW COMPARISON:  03/11/2012 FINDINGS: Previous median sternotomy and CABG procedure. There are small bilateral pleural effusions. Advanced coarsened interstitial changes of COPD identified. No superimposed airspace consolidation. IMPRESSION: 1. Mild CHF. Electronically Signed   By: Signa Kell M.D.   On: 05/26/2015 15:14   I have personally reviewed and evaluated these images and lab results as part of my medical decision-making.   EKG Interpretation   Date/Time:  Wednesday May 26 2015 12:53:50 EST Ventricular Rate:  83 PR Interval:  176 QRS Duration: 94 QT Interval:  376 QTC Calculation: 441 R Axis:    99 Text Interpretation:  Normal sinus rhythm Rightward axis ST \\T \ T wave  abnormality, consider inferolateral ischemia Abnormal ECG Confirmed by  Fayrene Fearing  MD, MARK ([id number]) on 05/26/2015 12:53:54 PM      MDM   Final diagnoses:  Cirrhosis (HCC)  Elevated troponin  Dyspnea  Symptomatic anemia  Acute kidney injury (HCC)   82yo female with history of htn, hypercholesterolemia, CAD s/p CABG, CVA, presents with concern for shortness of breath. Patient referred from PCP office. Pt had reported hypoxia at PCP, however in ED has sats of 100% on room air.  Labs significant for anemia of 5.5. EKG with lateral TW changes, concern for ischemia. Troponin elevated.  ECG and troponin likely secondary to heart strain in setting of anemia.  2U pRBC ordered.  ASA given and Cardiology consulted. No hx to suggest melena, hemoccult negative. Doubt significant acute GI bleed, however would continue to monitor stool, eval other etiologies or anemia as inpt.  Patient to be admitted to medicine for continued care with Cardiology consult.    Alvira Monday, MD 05/26/15 2040

## 2015-05-26 NOTE — ED Notes (Signed)
Notified RN of 2 units of blood ready.

## 2015-05-27 ENCOUNTER — Observation Stay (HOSPITAL_COMMUNITY): Payer: Medicare Other

## 2015-05-27 ENCOUNTER — Encounter (HOSPITAL_COMMUNITY): Payer: Self-pay | Admitting: General Practice

## 2015-05-27 DIAGNOSIS — D649 Anemia, unspecified: Secondary | ICD-10-CM | POA: Diagnosis not present

## 2015-05-27 DIAGNOSIS — J9601 Acute respiratory failure with hypoxia: Secondary | ICD-10-CM | POA: Diagnosis not present

## 2015-05-27 DIAGNOSIS — D509 Iron deficiency anemia, unspecified: Secondary | ICD-10-CM | POA: Diagnosis not present

## 2015-05-27 DIAGNOSIS — R0600 Dyspnea, unspecified: Secondary | ICD-10-CM

## 2015-05-27 DIAGNOSIS — I2510 Atherosclerotic heart disease of native coronary artery without angina pectoris: Secondary | ICD-10-CM | POA: Diagnosis not present

## 2015-05-27 DIAGNOSIS — R7989 Other specified abnormal findings of blood chemistry: Secondary | ICD-10-CM | POA: Diagnosis not present

## 2015-05-27 LAB — URINALYSIS, ROUTINE W REFLEX MICROSCOPIC
Bilirubin Urine: NEGATIVE
Glucose, UA: NEGATIVE mg/dL
Ketones, ur: NEGATIVE mg/dL
Nitrite: NEGATIVE
PROTEIN: NEGATIVE mg/dL
SPECIFIC GRAVITY, URINE: 1.011 (ref 1.005–1.030)
pH: 5 (ref 5.0–8.0)

## 2015-05-27 LAB — URINE MICROSCOPIC-ADD ON

## 2015-05-27 LAB — IRON AND TIBC
Iron: 119 ug/dL (ref 28–170)
SATURATION RATIOS: 23 % (ref 10.4–31.8)
TIBC: 507 ug/dL — ABNORMAL HIGH (ref 250–450)
UIBC: 388 ug/dL

## 2015-05-27 LAB — BASIC METABOLIC PANEL
ANION GAP: 10 (ref 5–15)
BUN: 42 mg/dL — ABNORMAL HIGH (ref 6–20)
CALCIUM: 9.3 mg/dL (ref 8.9–10.3)
CO2: 21 mmol/L — ABNORMAL LOW (ref 22–32)
CREATININE: 1.86 mg/dL — AB (ref 0.44–1.00)
Chloride: 104 mmol/L (ref 101–111)
GFR, EST AFRICAN AMERICAN: 26 mL/min — AB (ref >60)
GFR, EST NON AFRICAN AMERICAN: 23 mL/min — AB (ref >60)
Glucose, Bld: 99 mg/dL (ref 65–99)
Potassium: 4.5 mmol/L (ref 3.5–5.1)
SODIUM: 135 mmol/L (ref 135–145)

## 2015-05-27 LAB — CBC
HCT: 20.9 % — ABNORMAL LOW (ref 36.0–46.0)
HEMOGLOBIN: 6.5 g/dL — AB (ref 12.0–15.0)
MCH: 22.4 pg — AB (ref 26.0–34.0)
MCHC: 31.1 g/dL (ref 30.0–36.0)
MCV: 72.1 fL — ABNORMAL LOW (ref 78.0–100.0)
PLATELETS: 97 10*3/uL — AB (ref 150–400)
RBC: 2.9 MIL/uL — AB (ref 3.87–5.11)
RDW: 20.6 % — ABNORMAL HIGH (ref 11.5–15.5)
WBC: 4 10*3/uL (ref 4.0–10.5)

## 2015-05-27 LAB — FERRITIN: FERRITIN: 7 ng/mL — AB (ref 11–307)

## 2015-05-27 LAB — PREPARE RBC (CROSSMATCH)

## 2015-05-27 LAB — VITAMIN B12: Vitamin B-12: 657 pg/mL (ref 180–914)

## 2015-05-27 LAB — HEMOGLOBIN AND HEMATOCRIT, BLOOD
HEMATOCRIT: 32.9 % — AB (ref 36.0–46.0)
HEMOGLOBIN: 10.5 g/dL — AB (ref 12.0–15.0)

## 2015-05-27 LAB — TROPONIN I: TROPONIN I: 0.57 ng/mL — AB (ref <0.031)

## 2015-05-27 LAB — FOLATE: FOLATE: 26 ng/mL (ref >5.9)

## 2015-05-27 MED ORDER — BENZONATATE 100 MG PO CAPS: 100.0000 mg | ORAL_CAPSULE | Freq: Three times a day (TID) | ORAL | Status: DC | PRN

## 2015-05-27 MED ORDER — SODIUM CHLORIDE 0.9 % IV SOLN
Freq: Once | INTRAVENOUS | Status: AC
  Administered 2015-05-27: 11:00:00 via INTRAVENOUS

## 2015-05-27 MED ORDER — ENSURE ENLIVE PO LIQD: 237.0000 mL | Freq: Two times a day (BID) | ORAL | Status: DC

## 2015-05-27 MED ORDER — FUROSEMIDE 10 MG/ML IJ SOLN
20.0000 mg | Freq: Once | INTRAMUSCULAR | Status: AC
  Administered 2015-05-27: 20 mg via INTRAVENOUS
  Filled 2015-05-27: qty 2

## 2015-05-27 MED ORDER — GUAIFENESIN-DM 100-10 MG/5ML PO SYRP
5.0000 mL | ORAL_SOLUTION | ORAL | Status: DC | PRN
  Administered 2015-05-28: 5 mL via ORAL
  Filled 2015-05-27 (×2): qty 5

## 2015-05-27 NOTE — Progress Notes (Addendum)
[facility] PROGRESS NOTE  Anne Hanna:[id number] DOB: [birth year] DOA: 05/26/2015  PCP: [name], MD  Brief HPI: 82 year old Caucasian female with a past medical history of coronary artery disease, hypertension, stroke, thrombocytopenia, hepatic cysts, presented with complaints of dyspnea mainly with exertion. She was found to have a hemoglobin of 5.5, and a mildly elevated troponin. She was hospitalized for further management.  Past medical history:  Past Medical History  Diagnosis Date  . Coronary artery disease     STATUS POST CABG  . Hypertension   . Hypercholesterolemia   . CVA (cerebral vascular accident) (HCC)     OCULAR CVA  . Hx: UTI (urinary tract infection)   . Glaucoma     History of glaucoma  . Bronchopneumonia     Right lung  . Urinary tract infection     Escherichia coli urinary tract infection  . Acute sinusitis   . Thrombocytopenia (HCC) 2010     platelet count of 96 at discharge (95 on admission)  . Hyperlipidemia   . History of transient ischemic attack   . Hyponatremia     volume depletion - resolved  . Volume overload     History of postoperative volume overload  . Delirium     Postoperative delirium  . Cataract   . CVA (cerebral infarction)     temporal hemorhage  . Gallstones   . Hepatic cyst   . Cirrhosis (HCC)     per endo 2015    Consultants: Cardiology  Procedures: None  Antibiotics: None  Subjective: Patient feels slightly better this morning. However, she tells me that her shortness of breath was mainly with exertion. Denies any chest pain. No nausea or vomiting. No abdominal pain. Absolutely denies any blood in the stool or black colored stool. She tells me that she visually inspects her stool after every bowel movement.  Objective: Vital Signs  Filed Vitals:   05/26/15 1833 05/26/15 1907 05/26/15 2028 05/27/15 0554  BP: 114/55 151/67 127/57 119/45  Pulse: 77 86 82 79  Temp: 97.7 F (36.5 C) 98.1 F (36.7  C) 97.6 F (36.4 C) 100.1 F (37.8 C)  TempSrc: Oral Oral Oral Oral  Resp: SpO2: 99% 99% 98% 95%    Intake/Output Summary (Last 24 hours) at 05/27/15 0900 Last data filed at 05/27/15 4401  Gross per 24 hour  Intake    710 ml  Output      0 ml  Net    710 ml   There were no vitals filed for this visit.  General appearance: alert, cooperative, appears stated age and no distress Resp: Diminished air entry at the bases. No definite crackles or wheezing. Cardio: regular rate and rhythm, S1, S2 normal, no murmur, click, rub or gallop GI: soft, non-tender; bowel sounds normal; no masses,  no organomegaly Neurologic: Alert and oriented 3. No facial asymmetry. Motor strength equal bilateral upper and lower extremities.  Lab Results:  Basic Metabolic Panel:  Recent Labs Lab 05/26/15 1300 05/26/15 2211 05/27/15 0520  NA 132* 134* 135  K 5.0 4.5 4.5  CL 99* 101 104  CO2 16* 22 21*  GLUCOSE 115* 111* 99  BUN 41* 44* 42*  CREATININE 1.93* 1.91* 1.86*  CALCIUM 10.2 9.8 9.3   Liver Function Tests:  Recent Labs Lab 05/26/15 2211  AST 37  ALT 17  ALKPHOS 43  BILITOT 1.6*  PROT 5.5*  ALBUMIN 3.2*   CBC:  Recent Labs Lab 05/26/15  1300 05/27/15 0520  WBC 4.4 4.0  HGB 5.5* 6.5*  HCT 18.2* 20.9*  MCV 68.4* 72.1*  PLT 124* 97*   Cardiac Enzymes:  Recent Labs Lab 05/26/15 2211 05/27/15 0520  TROPONINI 0.56* 0.57*   BNP (last 3 results)  Recent Labs  05/26/15 2211  BNP 1485.7*     Studies/Results: Dg Chest Portable 1 View  05/26/2015  CLINICAL DATA:  Weakness and shortness of breath for 4 days EXAM: PORTABLE CHEST 1 VIEW COMPARISON:  03/11/2012 FINDINGS: Previous median sternotomy and CABG procedure. There are small bilateral pleural effusions. Advanced coarsened interstitial changes of COPD identified. No superimposed airspace consolidation. IMPRESSION: 1. Mild CHF. Electronically Signed   By: Signa Kell M.D.   On: 05/26/2015 15:14   US  Abdomen Limited Ruq  05/27/2015  CLINICAL DATA:  Hepatic cirrhosis. EXAM: US ABDOMEN LIMITED - RIGHT UPPER QUADRANT COMPARISON:  CT scan of January 17, 2013. FINDINGS: Gallbladder: Gallstones are noted. No significant gallbladder wall thickening or pericholecystic fluid is noted. No sonographic Murphy's sign is noted. Common bile duct: Diameter: 3.4 mm which is within normal limits. Liver: Nodular hepatic margins are noted with heterogeneous echotexture of hepatic parenchyma consistent with hepatic cirrhosis. Complex cystic structure measuring 3.2 x 2.5 x 2.3 cm is noted in the right hepatic lobe with peripheral calcifications and possible septations which corresponds to abnormality seen on prior CT scan. IMPRESSION: Cholelithiasis without evidence of cholecystitis. Findings consistent with hepatic cirrhosis. 3.2 cm complex cystic structure seen in right hepatic lobe with peripheral calcifications and possible septations which corresponds to abnormality seen on prior CT scan. Given the lack of significant change in size, it is felt to be most likely benign. Electronically Signed   By: [name], M.D.   On: 05/27/2015 07:54    Medications:  Scheduled: . aspirin EC  81 mg Oral Daily  . guaiFENesin  600 mg Oral BID  . latanoprost  1 drop Both Eyes QHS  . levETIRAcetam  250 mg Oral BID  . loratadine  10 mg Oral Daily  . nebivolol  10 mg Oral Daily  . pantoprazole (PROTONIX) IV  40 mg Intravenous Q12H  . rosuvastatin  10 mg Oral Daily  . sodium chloride flush  3 mL Intravenous Q12H  . timolol  1 drop Both Eyes Daily   Continuous:  ZOX:[id number] **OR** acetaminophen, alum & mag hydroxide-simeth, bisacodyl, HYDROcodone-acetaminophen, morphine injection, ondansetron **OR** ondansetron (ZOFRAN) IV, traZODone  Assessment/Plan:  Principal Problem:   Acute respiratory failure with hypoxia (HCC) Active Problems:   Coronary artery disease   Hypertension   Hypercholesterolemia   Anemia    Elevated troponin   Dyspnea   Symptomatic anemia   Acute kidney injury (HCC)   Jaundice   CAP (community acquired pneumonia)    Anemia, Microcytic Patient was symptomatic. Hemoglobin was 5.5 at the time of admission. She hasn't had any overt bleeding. Stool for occult blood was negative. No active bleeding is noted. This could be a chronic GI blood loss. Anemia panel does suggest severe iron deficiency. She was transfused 2 units of PRBCs with only minimal improvement. She'll be transfused 2 more units. Repeat hemoglobin posttransfusion. Will benefit from iron supplementation. Since there is no evidence for active bleeding and her stool for occult blood is negative plan will be to transfuse her and have her follow-up with her gastroenterologist as outpatient. She did have upper endoscopy and 2015 which revealed small gastric varices. I did call her primary care physician's office. The  last hemoglobin result that they have was in 2014 and it was 11.5, MCV was 92 at that time.  Elevated troponin Likely due to demand ischemia from above. She does have a history of CAD. Patient seen by cardiology. No testing to be considered at this time. Medical management. Continue low-dose aspirin as his Crestor. Continue beta blocker.   Questionable acute kidney injury in the setting of chronic kidney disease Creatinine 1.93 on admission. Based on primary care physician's office. Patient's creatinine was 1.28 in November 2016. BUN was 29. Continue to monitor closely. Will not be very aggressive with volume due to her history of heart disease.   Dyspnea with exertion This is most likely secondary to anemia. Should improve once hemoglobin reaches close to 8. BNP is noted to be elevated, although clinically she appears to be euvolemic. Lasix in between blood transfusions  History of liver cirrhosis with thrombocytopenia Bilirubin noted to be slightly elevated. [facility].  Endoscopy and 2015. No reports of colonoscopy. Her weight in our system. Outpatient follow-up. Thrombocytopenia noted on blood work. Likely due to liver cirrhosis. No overt bleeding.  Patient noted to be on Keppra at home. Could have a history of seizures. Continue for now.  DVT Prophylaxis: SCDs    Code Status: DO NOT RESUSCITATE  Family Communication: No family at bedside. Discussed with her daughter over the phone. Disposition Plan: Transfuse 2 more units of blood. PT evaluation. Possible discharge tomorrow.    LOS: 1 day   [facility] HamiltonKRISHNAN,Dalvin Clipper  [facility] Pager [phone] 05/27/2015, 9:00 AM  If 7PM-7AM, please contact night-coverage at [email or web address], password [email or web address]

## 2015-05-27 NOTE — Progress Notes (Signed)
CRITICAL VALUE ALERT  Critical value received: troponin 0.56  Date of notification:  05/26/2015  Time of notification:  2348  Critical value read back: yes  Nurse who received alert:  Jamelle Rushingleticia Kieu Quiggle  MD notified (1st page): Craige CottaKirby  Time of first page: 2351  MD notified (2nd page):not yet paged  Time of second page:  Responding MD:  Has not yet responded  Time MD responded: hasn't paged yet

## 2015-05-27 NOTE — Progress Notes (Signed)
Pt troponin level 0.56 NP on call Craige CottaKirby was notified will continue to monitor

## 2015-05-27 NOTE — Progress Notes (Signed)
Pt temp100.1 gave her tylenol, pt went for ultrasound,did not rechecked temp due to pt being out of the unit for ultrasound, will pass it on to the incoming nurse

## 2015-05-27 NOTE — Care Management Obs Status (Signed)
MEDICARE OBSERVATION STATUS NOTIFICATION   Patient Details  Name: Anne Hanna MRN: [id number] Date of Birth: 05/28/1922   Medicare Observation Status Notification Given:  Yes    Lawerance Sabalebbie Chinmay Squier, RN 05/27/2015, 1:20 PM

## 2015-05-27 NOTE — Progress Notes (Signed)
SUBJECTIVE:  SOB improved  OBJECTIVE:   Vitals:   Filed Vitals:   05/27/15 0554 05/27/15 1006 05/27/15 1040 05/27/15 1057  BP: 119/45 119/46 111/43 109/48  Pulse: 79 78 78 75  Temp: 100.1 F (37.8 C) 97.7 F (36.5 C) 97.9 F (36.6 C) 98.1 F (36.7 C)  TempSrc: Oral Oral Oral Oral  Resp: 18 18 18 15   SpO2: 95% 97% 94% 98%   I&O's:   Intake/Output Summary (Last 24 hours) at 05/27/15 1131 Last data filed at 05/27/15 0900  Gross per 24 hour  Intake    950 ml  Output      0 ml  Net    950 ml   TELEMETRY: Reviewed telemetry pt in NSR with PVCs:     PHYSICAL EXAM General: Well developed, well nourished, in no acute distress Head: Eyes PERRLA, No xanthomas.   Normal cephalic and atramatic  Lungs:   Clear bilaterally to auscultation and percussion. Heart:   HRRR S1 S2 Pulses are 2+ & equal. Abdomen: Bowel sounds are positive, abdomen soft and non-tender without masses  Extremities:   No clubbing, cyanosis or edema.  DP +1 Neuro: Alert and oriented X 3. Psych:  Good affect, responds appropriately   LABS: Basic Metabolic Panel:  Recent Labs  16/12/9601/08/17 2211 05/27/15 0520  NA 134* 135  K 4.5 4.5  CL 101 104  CO2 22 21*  GLUCOSE 111* 99  BUN 44* 42*  CREATININE 1.91* 1.86*  CALCIUM 9.8 9.3   Liver Function Tests:  Recent Labs  05/26/15 2211  AST 37  ALT 17  ALKPHOS 43  BILITOT 1.6*  PROT 5.5*  ALBUMIN 3.2*   No results for input(s): LIPASE, AMYLASE in the last 72 hours. CBC:  Recent Labs  05/26/15 1300 05/27/15 0520  WBC 4.4 4.0  HGB 5.5* 6.5*  HCT 18.2* 20.9*  MCV 68.4* 72.1*  PLT 124* 97*   Cardiac Enzymes:  Recent Labs  05/26/15 2211 05/27/15 0520  TROPONINI 0.56* 0.57*   BNP: Invalid input(s): POCBNP D-Dimer: No results for input(s): DDIMER in the last 72 hours. Hemoglobin A1C: No results for input(s): HGBA1C in the last 72 hours. Fasting Lipid Panel: No results for input(s): CHOL, HDL, LDLCALC, TRIG, CHOLHDL, LDLDIRECT in the  last 72 hours. Thyroid Function Tests: No results for input(s): TSH, T4TOTAL, T3FREE, THYROIDAB in the last 72 hours.  Invalid input(s): FREET3 Anemia Panel:  Recent Labs  05/26/15 2211  VITAMINB12 657  FOLATE 26.0  FERRITIN 7*  TIBC 507*  IRON 119  RETICCTPCT 1.0   Coag Panel:   Lab Results  Component Value Date   INR 1.23 03/04/2012   INR 1.4 10/10/2006   INR 1.3 10/08/2006    RADIOLOGY: Dg Chest Portable 1 View  05/26/2015  CLINICAL DATA:  Weakness and shortness of breath for 4 days EXAM: PORTABLE CHEST 1 VIEW COMPARISON:  03/11/2012 FINDINGS: Previous median sternotomy and CABG procedure. There are small bilateral pleural effusions. Advanced coarsened interstitial changes of COPD identified. No superimposed airspace consolidation. IMPRESSION: 1. Mild CHF. Electronically Signed   By: Signa Kellaylor  Stroud M.D.   On: 05/26/2015 15:14   [place] Abdomen Limited Ruq  05/27/2015  CLINICAL DATA:  Hepatic cirrhosis. EXAM: US ABDOMEN LIMITED - RIGHT UPPER QUADRANT COMPARISON:  CT scan of January 17, 2013. FINDINGS: Gallbladder: Gallstones are noted. No significant gallbladder wall thickening or pericholecystic fluid is noted. No sonographic Murphy's sign is noted. Common bile duct: Diameter: 3.4 mm which is within normal limits.  Liver: Nodular hepatic margins are noted with heterogeneous echotexture of hepatic parenchyma consistent with hepatic cirrhosis. Complex cystic structure measuring 3.2 x 2.5 x 2.3 cm is noted in the right hepatic lobe with peripheral calcifications and possible septations which corresponds to abnormality seen on prior CT scan. IMPRESSION: Cholelithiasis without evidence of cholecystitis. Findings consistent with hepatic cirrhosis. 3.2 cm complex cystic structure seen in right hepatic lobe with peripheral calcifications and possible septations which corresponds to abnormality seen on prior CT scan. Given the lack of significant change in size, it is felt to be most likely benign.  Electronically Signed   By: [name], M.D.   On: 05/27/2015 07:54    ASSESSMENT AND PLAN:   Anne Hanna is a 82 y.o. 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  1. Elevated troponin with flat trend c/w demand ischemia from severe anemia - in setting of severe anemia and acute kidney injury. No angina pain. Her SOB most likely due to anemia. EKG with new changes. Given age and acute comorbidity will not pursue with invasive study. The patient and daughter interested in medical therapy. No further ischemic w/u.    2. CAD s/p CABG 2008 - Echo 03/06/12 showed LV EF of 60%, possible mild hypokinesis of the base-inferior wall. PA peak pressure of 37mm HG.  - No anginal pain. Continue statin and BB. ASA management per primary.   3. Symptomatic Anemia - Evaluation per primary. Negative stool guaiac.  4. Mild CHF on CXR - Pending BNP elevated but on exam appears euvolemic. Watch closely for volume overload given transfusion .    Otherwise per primary  Hypertension  Hypercholesterolemia  Elevated troponin  Dyspnea  Symptomatic anemia  Acute kidney injury (HCC)  No new recs at this time.  Will sign off. Call with any questions.  Quintella Reichert, MD  05/27/2015  11:31 AM

## 2015-05-28 DIAGNOSIS — D649 Anemia, unspecified: Secondary | ICD-10-CM | POA: Diagnosis not present

## 2015-05-28 DIAGNOSIS — D509 Iron deficiency anemia, unspecified: Secondary | ICD-10-CM | POA: Diagnosis not present

## 2015-05-28 LAB — CBC
HEMATOCRIT: 32.5 % — AB (ref 36.0–46.0)
HEMOGLOBIN: 10.4 g/dL — AB (ref 12.0–15.0)
MCH: 24.5 pg — AB (ref 26.0–34.0)
MCHC: 32 g/dL (ref 30.0–36.0)
MCV: 76.5 fL — ABNORMAL LOW (ref 78.0–100.0)
Platelets: 102 10*3/uL — ABNORMAL LOW (ref 150–400)
RBC: 4.25 MIL/uL (ref 3.87–5.11)
RDW: 22 % — ABNORMAL HIGH (ref 11.5–15.5)
WBC: 4.9 10*3/uL (ref 4.0–10.5)

## 2015-05-28 LAB — TYPE AND SCREEN
ABO/RH(D): O NEG
Antibody Screen: NEGATIVE
UNIT DIVISION: 0
UNIT DIVISION: 0
UNIT DIVISION: 0
Unit division: 0

## 2015-05-28 LAB — COMPREHENSIVE METABOLIC PANEL
ALBUMIN: 3.2 g/dL — AB (ref 3.5–5.0)
ALK PHOS: 48 U/L (ref 38–126)
ALT: 19 U/L (ref 14–54)
AST: 42 U/L — ABNORMAL HIGH (ref 15–41)
Anion gap: 11 (ref 5–15)
BILIRUBIN TOTAL: 1.4 mg/dL — AB (ref 0.3–1.2)
BUN: 37 mg/dL — ABNORMAL HIGH (ref 6–20)
CALCIUM: 9.4 mg/dL (ref 8.9–10.3)
CO2: 22 mmol/L (ref 22–32)
CREATININE: 1.72 mg/dL — AB (ref 0.44–1.00)
Chloride: 102 mmol/L (ref 101–111)
GFR calc Af Amer: 29 mL/min — ABNORMAL LOW (ref >60)
GFR calc non Af Amer: 25 mL/min — ABNORMAL LOW (ref >60)
GLUCOSE: 103 mg/dL — AB (ref 65–99)
Potassium: 4.2 mmol/L (ref 3.5–5.1)
SODIUM: 135 mmol/L (ref 135–145)
TOTAL PROTEIN: 5.6 g/dL — AB (ref 6.5–8.1)

## 2015-05-28 MED ORDER — FERROUS SULFATE 325 (65 FE) MG PO TABS: 325.0000 mg | ORAL_TABLET | Freq: Every day | ORAL | Status: AC

## 2015-05-28 MED ORDER — FUROSEMIDE 10 MG/ML IJ SOLN
20.0000 mg | Freq: Once | INTRAMUSCULAR | Status: AC
  Administered 2015-05-28: 20 mg via INTRAVENOUS
  Filled 2015-05-28: qty 2

## 2015-05-28 MED ORDER — PANTOPRAZOLE SODIUM 40 MG PO TBEC: 40.0000 mg | DELAYED_RELEASE_TABLET | Freq: Every day | ORAL | Status: DC

## 2015-05-28 NOTE — Evaluation (Signed)
Physical Therapy Evaluation Patient Details Name: Anne Hanna MRN: [id number] DOB: 11/05/1922 Today's Date: 05/28/2015   History of Present Illness  Pt adm with SOB and found to have Hgb of 5.5. PMH - CABG, HTN, CVA,   Clinical Impression  Pt admitted with above diagnosis and presents to PT with functional limitations due to deficits listed below (See PT problem list). Pt needs skilled PT to maximize independence and safety to allow discharge to home with family support.      Follow Up Recommendations Home health PT;Supervision for mobility/OOB    Equipment Recommendations  None recommended by PT    Recommendations for Other Services       Precautions / Restrictions Precautions Precautions: Fall      Mobility  Bed Mobility Overal bed mobility: Modified Independent             General bed mobility comments: Incr time  Transfers Overall transfer level: Needs assistance Equipment used: 4-wheeled walker Transfers: Sit to/from Stand Sit to Stand: Min guard         General transfer comment: Assist for safety  Ambulation/Gait Ambulation/Gait assistance: Min guard;Supervision Ambulation Distance (Feet): 180 Feet Assistive device: 4-wheeled walker Gait Pattern/deviations: Step-through pattern;Decreased stride length   Gait velocity interpretation: <1.8 ft/sec, indicative of risk for recurrent falls General Gait Details: Slightly unsteady gait but no overt loss of balance using rollator  Stairs            Wheelchair Mobility    Modified Rankin (Stroke Patients Only)       Balance Overall balance assessment: Needs assistance Sitting-balance support: No upper extremity supported;Feet supported Sitting balance-Leahy Scale: Good     Standing balance support: No upper extremity supported;During functional activity Standing balance-Leahy Scale: Fair                               Pertinent Vitals/Pain Pain Assessment: No/denies pain     Home Living Family/patient expects to be discharged to:: Private residence Living Arrangements: Alone Available Help at Discharge: Family;Available 24 hours/day Type of Home: House Home Access: Stairs to enter Entrance Stairs-Rails: None Entrance Stairs-Number of Steps: 3 Home Layout: One level Home Equipment: Walker - 4 wheels;Cane - single point;Shower seat      Prior Function Level of Independence: Independent with assistive device(s)         Comments: Amb with cane     Hand Dominance   Dominant Hand: Right    Extremity/Trunk Assessment   Upper Extremity Assessment: Generalized weakness           Lower Extremity Assessment: Generalized weakness         Communication   Communication: No difficulties  Cognition Arousal/Alertness: Awake/alert Behavior During Therapy: WFL for tasks assessed/performed Overall Cognitive Status: Within Functional Limits for tasks assessed                      General Comments      Exercises        Assessment/Plan    PT Assessment Patient needs continued PT services  PT Diagnosis Difficulty walking;Generalized weakness   PT Problem List Decreased strength;Decreased activity tolerance;Decreased balance;Decreased mobility  PT Treatment Interventions DME instruction;Gait training;Functional mobility training;Therapeutic activities;Therapeutic exercise;Balance training;Patient/family education   PT Goals (Current goals can be found in the Care Plan section) Acute Rehab PT Goals Patient Stated Goal: Return home PT Goal Formulation: With patient Time For Goal Achievement: 06/04/15  Potential to Achieve Goals: Good    Frequency Min 3X/week   Barriers to discharge        Co-evaluation               End of Session Equipment Utilized During Treatment: Gait belt Activity Tolerance: Patient tolerated treatment well Patient left: in chair;with call bell/phone within reach;with chair alarm set Nurse  Communication: Mobility status    Functional Assessment Tool Used: clinical judgement Functional Limitation: Mobility: Walking and moving around Mobility: Walking and Moving Around Current Status (Z6109): At least 20 percent but less than 40 percent impaired, limited or restricted Mobility: Walking and Moving Around Goal Status [phone]): At least 1 percent but less than 20 percent impaired, limited or restricted    Time: 1210-1228 PT Time Calculation (min) (ACUTE ONLY): 18 min   Charges:   PT Evaluation $PT Eval Moderate Complexity: 1 Procedure     PT G Codes:   PT G-Codes **NOT FOR INPATIENT CLASS** Functional Assessment Tool Used: clinical judgement Functional Limitation: Mobility: Walking and moving around Mobility: Walking and Moving Around Current Status (U9811): At least 20 percent but less than 40 percent impaired, limited or restricted Mobility: Walking and Moving Around Goal Status [phone]): At least 1 percent but less than 20 percent impaired, limited or restricted    [facility] 05/28/2015, 1:51 PM [facility] PT [phone]

## 2015-05-28 NOTE — Discharge Instructions (Signed)
Iron Deficiency Anemia, Adult Anemia is a condition in which there are less red blood cells or hemoglobin in the blood than normal. Hemoglobin is the part of red blood cells that carries oxygen. Iron deficiency anemia is anemia caused by too little iron. It is the most common type of anemia. It may leave you tired and short of breath. CAUSES   Lack of iron in the diet.  Poor absorption of iron, as seen with intestinal disorders.  Intestinal bleeding.  Heavy periods. SIGNS AND SYMPTOMS  Mild anemia may not be noticeable. Symptoms may include:  Fatigue.  Headache.  Pale skin.  Weakness.  Tiredness.  Shortness of breath.  Dizziness.  Cold hands and feet.  Fast or irregular heartbeat. DIAGNOSIS  Diagnosis requires a thorough evaluation and physical exam by your health care provider. Blood tests are generally used to confirm iron deficiency anemia. Additional tests may be done to find the underlying cause of your anemia. These may include:  Testing for blood in the stool (fecal occult blood test).  A procedure to see inside the colon and rectum (colonoscopy).  A procedure to see inside the esophagus and stomach (endoscopy). TREATMENT  Iron deficiency anemia is treated by correcting the cause of the deficiency. Treatment may involve:  Adding iron-rich foods to your diet.  Taking iron supplements. Pregnant or breastfeeding women need to take extra iron because their normal diet usually does not provide the required amount.  Taking vitamins. Vitamin C improves the absorption of iron. Your health care provider may recommend that you take your iron tablets with a glass of orange juice or vitamin C supplement.  Medicines to make heavy menstrual flow lighter.  Surgery. HOME CARE INSTRUCTIONS   Take iron as directed by your health care provider.  If you cannot tolerate taking iron supplements by mouth, talk to your health care provider about taking them through a vein  (intravenously) or an injection into a muscle.  For the best iron absorption, iron supplements should be taken on an empty stomach. If you cannot tolerate them on an empty stomach, you may need to take them with food.  Do not drink milk or take antacids at the same time as your iron supplements. Milk and antacids may interfere with the absorption of iron.  Iron supplements can cause constipation. Make sure to include fiber in your diet to prevent constipation. A stool softener may also be recommended.  Take vitamins as directed by your health care provider.  Eat a diet rich in iron. Foods high in iron include liver, lean beef, whole-grain bread, eggs, dried fruit, and dark green leafy vegetables. SEEK IMMEDIATE MEDICAL CARE IF:   You faint. If this happens, do not drive. Call your local emergency services (911 in [place] if no other help is available.  You have chest pain.  You feel nauseous or vomit.  You have severe or increased shortness of breath with activity.  You feel weak.  You have a rapid heartbeat.  You have unexplained sweating.  You become light-headed when getting up from a chair or bed. MAKE SURE YOU:   Understand these instructions.  Will watch your condition.  Will get help right away if you are not doing well or get worse.   This information is not intended to replace advice given to you by your health care provider. Make sure you discuss any questions you have with your health care provider.   Document Released: 03/03/2000 Document Revised: 03/27/2014 Document Reviewed: 11/11/2012 Elsevier   Interactive Patient Education 2016 Elsevier Inc.  

## 2015-05-28 NOTE — Progress Notes (Signed)
Nutrition Brief Note  Patient identified on the Malnutrition Screening Tool (MST) Report  Wt Readings from Last 15 Encounters:  05/27/15 127 lb 8.4 oz (57.844 kg)  05/03/15 125 lb 5 oz (56.841 kg)  11/04/14 131 lb 9.6 oz (59.693 kg)  10/05/14 127 lb (57.607 kg)  05/18/14 131 lb 9.6 oz (59.693 kg)  04/07/14 130 lb 14.4 oz (59.376 kg)  10/07/13 137 lb 1.6 oz (62.188 kg)  08/28/13 136 lb (61.689 kg)  04/22/13 130 lb (58.968 kg)  04/09/13 135 lb 12.8 oz (61.598 kg)  02/27/13 138 lb (62.596 kg)  10/09/12 134 lb (60.782 kg)  08/29/12 133 lb (60.328 kg)  05/27/12 128 lb (58.06 kg)  04/12/12 129 lb (58.514 kg)   Anne Hanna is a 82 y.o. female with a Past Medical History of CAD, HTN, HLD, CVA, chronic UTIs, who presents with symptomatically anemia.   Spoke with pt at bedside, who reports that her appetite is great. She reports that she has "slowed down with my eating" over the past few years due to her advanced age. Pt reports that she consumes 3 balanced meals daily.   Pt denies any weight loss, which is consistent with wt hx.   Nutrition-Focused physical exam completed. Findings are mild fat depletion, mild muscle depletion, and no edema. Suspect fat and muscle depletion is related to advanced age. Pt reports she is typically very active at home, including walking her dogs.   Case discussed with nurse tech. She reports that pt consumed about 75% of her breakfast meal this morning.   Body mass index is 21.22 kg/(m^2). Patient meets criteria for normal weight range based on current BMI.   Current diet order is Heart Healthy, patient is consuming approximately 55-60% of meals at this time. Labs and medications reviewed.   No nutrition interventions warranted at this time. If nutrition issues arise, please consult RD.   Anne Hanna, RD, LDN, CDE Pager: [phone] After hours Pager: [phone]

## 2015-05-28 NOTE — Progress Notes (Signed)
NURSING PROGRESS NOTE  Anne BeckmannMary F Tollett [id number] Discharge Data: 05/28/2015 3:15 PM Attending Provider: Osvaldo ShipperGokul Krishnan, MD WUJ:WJXB,JYNWGNFAOPCP:SHAW,KIMBERLEE, MD   Anne BeckmannMary F Fluitt to be D/C'd Home per MD order.    All IV's will be discontinued and monitored for bleeding.  All belongings will be returned to patient for patient to take home.  Last Documented Vital Signs:  Blood pressure 138/57, pulse 80, temperature 98.2 F (36.8 C), temperature source Oral, resp. rate 18, height 5\' 5"  (1.651 m), weight 57.844 kg (127 lb 8.4 oz), SpO2 100 %.  Madelin RearLonnie Pierrette Scheu, MSN, RN, [employer]CMSRN

## 2015-05-28 NOTE — Discharge Summary (Signed)
[facility]  Physician Discharge Summary   Patient ID: Anne BeckmannMary F Hanna MRN: [id number] DOB/AGE: 80/08/1922 80 y.o.  Admit date: 05/26/2015 Discharge date: 05/28/2015  PCP: Lupita RaiderSHAW,KIMBERLEE, MD  DISCHARGE DIAGNOSES:  Principal Problem:   Acute respiratory failure with hypoxia (HCC) Active Problems:   Coronary artery disease   Hypertension   Hypercholesterolemia   Anemia   Elevated troponin   Dyspnea   Symptomatic anemia   Acute kidney injury (HCC)   Jaundice   CAP (community acquired pneumonia)   RECOMMENDATIONS FOR OUTPATIENT FOLLOW UP: 1. Patient to discuss her anemia with her primary care providers. She may need a referral to gastroenterology 2. Home health   DISCHARGE CONDITION: fair  Diet recommendation: As before  [facility]   05/27/15 1855  Weight: 57.844 kg (127 lb 8.4 oz)    INITIAL HISTORY: 82 year old Caucasian female with a past medical history of coronary artery disease, hypertension, stroke, thrombocytopenia, hepatic cysts, presented with complaints of dyspnea mainly with exertion. She was found to have a hemoglobin of 5.5, and a mildly elevated troponin. She was hospitalized for further management. Patient denied any blood in the stool, black-colored stool, bleeding from anywhere else.  Consultations:  Cardiology   HOSPITAL COURSE:   Anemia, iron deficiency Patient was symptomatic. Hemoglobin was 5.5 at the time of admission. She hasn't had any overt bleeding. Stool for occult blood was negative. No active bleeding is noted. This could be a chronic GI blood loss. Anemia panel does suggest severe iron deficiency. She was transfused 2 units of PRBCs with only minimal improvement. So she was transfused 2 more units. Hemoglobin has responded appropriately. She will be started on oral iron supplementation. Since there is no evidence for active bleeding and her stool for occult blood is negative, plan will be to have her follow-up with her primary  care physician as follow-up to discuss referral to gastroenterology to see if she needs any further GI workup. She did have upper endoscopy in 2015 which revealed small gastric varices. I did call her primary care physician's office. The last hemoglobin result that they have was in 2014 and it was 11.5, MCV was 92 at that time.  Elevated troponin Likely due to demand ischemia from above. She does have a history of CAD. Patient seen by cardiology. No testing to be considered at this time. Medical management. Continue low-dose aspirin as his Crestor. Continue beta blocker.   Questionable acute kidney injury in the setting of chronic kidney disease Creatinine 1.93 on admission. Based on primary care physician's office, patient's creatinine was 1.28 in November 2016. BUN was 29. Rate mean has been trending down slowly. We were not very aggressive with volume due to her history of heart disease.   Dyspnea with exertion This is most likely secondary to anemia. Dyspnea has improved. Due to blood transfusion there was some degree of volume overload. She was given 2 doses of IV Lasix with improvement. Saturating normal.  History of liver cirrhosis with thrombocytopenia Bilirubin noted to be slightly elevated. [facility]. Endoscopy in 2015. No reports of colonoscopy. Outpatient follow-up. Thrombocytopenia noted on blood work. Likely due to liver cirrhosis. No overt bleeding.  Patient noted to be on Keppra at home. Could have a history of seizures. Continue for now.  Overall, stable. Hemoglobin has improved post transfusion. Discussed in detail with patient's sister. Patient will follow-up with her primary care providers as outpatient to discuss further management of the iron deficiency.  PERTINENT LABS:  The results of significant diagnostics from this hospitalization (including imaging, microbiology, ancillary and laboratory) are listed below for  reference.     Labs: Basic Metabolic Panel:  Recent Labs Lab 05/26/15 1300 05/26/15 2211 05/27/15 0520 05/28/15 0635  NA 132* 134* 135 135  K 5.0 4.5 4.5 4.2  CL 99* 101 104 102  CO2 16* 22 21* 22  GLUCOSE 115* 111* 99 103*  BUN 41* 44* 42* 37*  CREATININE 1.93* 1.91* 1.86* 1.72*  CALCIUM 10.2 9.8 9.3 9.4   Liver Function Tests:  Recent Labs Lab 05/26/15 2211 05/28/15 0635  AST 37 42*  ALT 17 19  ALKPHOS 43 48  BILITOT 1.6* 1.4*  PROT 5.5* 5.6*  ALBUMIN 3.2* 3.2*   CBC:  Recent Labs Lab 05/26/15 1300 05/27/15 0520 05/27/15 1840 05/28/15 0635  WBC 4.4 4.0  --  4.9  HGB 5.5* 6.5* 10.5* 10.4*  HCT 18.2* 20.9* 32.9* 32.5*  MCV 68.4* 72.1*  --  76.5*  PLT 124* 97*  --  102*   Cardiac Enzymes:  Recent Labs Lab 05/26/15 2211 05/27/15 0520  TROPONINI 0.56* 0.57*   BNP: BNP (last 3 results)  Recent Labs  05/26/15 2211  BNP 1485.7*    IMAGING STUDIES Dg Chest Portable 1 View  05/26/2015  CLINICAL DATA:  Weakness and shortness of breath for 4 days EXAM: PORTABLE CHEST 1 VIEW COMPARISON:  03/11/2012 FINDINGS: Previous median sternotomy and CABG procedure. There are small bilateral pleural effusions. Advanced coarsened interstitial changes of COPD identified. No superimposed airspace consolidation. IMPRESSION: 1. Mild CHF. Electronically Signed   By: Signa Kell M.D.   On: 05/26/2015 15:14   US Abdomen Limited Ruq  05/27/2015  CLINICAL DATA:  Hepatic cirrhosis. EXAM: US ABDOMEN LIMITED - RIGHT UPPER QUADRANT COMPARISON:  CT scan of January 17, 2013. FINDINGS: Gallbladder: Gallstones are noted. No significant gallbladder wall thickening or pericholecystic fluid is noted. No sonographic Murphy's sign is noted. Common bile duct: Diameter: 3.4 mm which is within normal limits. Liver: Nodular hepatic margins are noted with heterogeneous echotexture of hepatic parenchyma consistent with hepatic cirrhosis. Complex cystic structure measuring 3.2 x 2.5 x 2.3 cm is  noted in the right hepatic lobe with peripheral calcifications and possible septations which corresponds to abnormality seen on prior CT scan. IMPRESSION: Cholelithiasis without evidence of cholecystitis. Findings consistent with hepatic cirrhosis. 3.2 cm complex cystic structure seen in right hepatic lobe with peripheral calcifications and possible septations which corresponds to abnormality seen on prior CT scan. Given the lack of significant change in size, it is felt to be most likely benign. Electronically Signed   By: [name], M.D.   On: 05/27/2015 07:54    DISCHARGE EXAMINATION: Filed Vitals:   05/27/15 1855 05/27/15 2140 05/28/15 0517 05/28/15 1411  BP:  143/82 142/90 138/57  Pulse:  77 83 80  Temp:  98.1 F (36.7 C) 98.3 F (36.8 C) 98.2 F (36.8 C)  TempSrc:  Oral Oral Oral  Resp:  18 18 18   Height: 5\' 5"  (1.651 m)     Weight: 57.844 kg (127 lb 8.4 oz)     SpO2:  93% 92% 100%   General appearance: alert, cooperative, appears stated age and no distress Resp: clear to auscultation bilaterally Cardio: regular rate and rhythm, S1, S2 normal, no murmur, click, rub or gallop GI: soft, non-tender; bowel sounds normal; no masses,  no organomegaly  DISPOSITION: Home with home health  Discharge Instructions    Call  MD for:  difficulty breathing, headache or visual disturbances    Complete by:  As directed      Call MD for:  extreme fatigue    Complete by:  As directed      Call MD for:  persistant dizziness or light-headedness    Complete by:  As directed      Call MD for:  persistant nausea and vomiting    Complete by:  As directed      Call MD for:  severe uncontrolled pain    Complete by:  As directed      Call MD for:  temperature >100.4    Complete by:  As directed      Diet - low sodium heart healthy    Complete by:  As directed      Discharge instructions    Complete by:  As directed   You have been found to have severe iron deficiency anemia. You were  transfused 4 units of blood. Please discuss this further with your primary care provider. You may need to be referred to the gastroenterologist for further workup.  You were cared for by a hospitalist during your hospital stay. If you have any questions about your discharge medications or the care you received while you were in the hospital after you are discharged, you can call the unit and asked to speak with the hospitalist on call if the hospitalist that took care of you is not available. Once you are discharged, your primary care physician will handle any further medical issues. Please note that NO REFILLS for any discharge medications will be authorized once you are discharged, as it is imperative that you return to your primary care physician (or establish a relationship with a primary care physician if you do not have one) for your aftercare needs so that they can reassess your need for medications and monitor your lab values. If you do not have a primary care physician, you can call [phone] for a physician referral.     Increase activity slowly    Complete by:  As directed            ALLERGIES:  Allergies  Allergen Reactions  . Dilantin [Phenytoin]   . Amlodipine Swelling  . Lipitor [Atorvastatin Calcium]     HEADACHES     Discharge Medication List as of 05/28/2015  3:01 PM    START taking these medications   Details  ferrous sulfate 325 (65 FE) MG tablet Take 1 tablet (325 mg total) by mouth daily with breakfast., Starting 05/28/2015, Until Discontinued, Print      CONTINUE these medications which have NOT CHANGED   Details  aspirin 81 MG tablet Take 81 mg by mouth daily., Until Discontinued, Historical Med    hydrochlorothiazide 25 MG tablet Take 25 mg by mouth daily.  , Until Discontinued, Historical Med    LATANOPROST OP Apply 1 drop to eye daily. One drop in each eye daily., Until Discontinued, Historical Med    levETIRAcetam (KEPPRA) 250 MG tablet Take 1 tablet (250  mg total) by mouth 2 (two) times daily., Starting 11/04/2014, Until Discontinued, Print    loratadine (CLARITIN) 10 MG tablet Take 10 mg by mouth daily., Until Discontinued, Historical Med    nebivolol (BYSTOLIC) 10 MG tablet Take 10 mg by mouth daily., Until Discontinued, Historical Med    nitroGLYCERIN (NITROSTAT) 0.4 MG SL tablet Place 1 tablet (0.4 mg total) under the tongue every 5 (five) minutes as needed., Starting  04/12/2012, Until Discontinued, Normal    omeprazole (PRILOSEC) 20 MG capsule Take 20 mg by mouth daily. Take 1 tab by mouth daily, Starting 04/12/2015, Until Discontinued, Historical Med    pseudoephedrine-guaifenesin (MUCINEX D) 60-600 MG 12 hr tablet Take 1 tablet by mouth daily., Until Discontinued, Historical Med    rosuvastatin (CRESTOR) 10 MG tablet Take 1 tablet (10 mg total) by mouth daily., Starting 02/22/2015, Until Discontinued, Print    Timolol Maleate (ISTALOL) 0.5 % (DAILY) SOLN Place 1 drop into both eyes daily., Until Discontinued, Historical Med       Follow-up Information    Follow up with [facility].   Why:  RN PT. WIll call 1-2 days after discharge to set up first home appointment   Contact information:   [address] [place] [address] [phone]       Follow up with [name], MD. Schedule an appointment as soon as possible for a visit in 1 week.   Specialty:  Family Medicine   Why:  to discuss reason for anemia and possible referral back to gastroenterology   Contact information:   [address] [place] [address] [phone]       TOTAL DISCHARGE TIME: 35 minutes  [facility]  [facility] Pager [phone]  05/28/2015, 4:00 PM

## 2015-05-28 NOTE — Care Management Note (Signed)
Case Management Note  Patient Details  Name: Anne Hanna MRN: [id number] Date of Birth: 10/25/1922  Subjective/Objective:                 Spoke to daughter Anne Hanna per patient's request to set up home health. Linda declines DME, would like PT and RN, [facility]   Action/Plan:   Expected Discharge Date:                  Expected Discharge Plan:  Home w Home Health Services  In-House Referral:     Discharge planning Services  CM Consult  Post Acute Care Choice:    Choice offered to:     DME Arranged:    DME Agency:     HH Arranged:  RN, PT HH Agency:  [facility]  Status of Service:  Completed, signed off  Medicare Important Message Given:    Date Medicare IM Given:    Medicare IM give by:    Date Additional Medicare IM Given:    Additional Medicare Important Message give by:     If discussed at Long Length of Stay Meetings, dates discussed:    Additional Comments:  Lawerance SabalDebbie Bernd Crom, RN 05/28/2015, 11:30 AM

## 2015-06-13 ENCOUNTER — Emergency Department (HOSPITAL_COMMUNITY): Payer: Medicare Other

## 2015-06-13 ENCOUNTER — Inpatient Hospital Stay (HOSPITAL_COMMUNITY)
Admission: EM | Admit: 2015-06-13 | Discharge: 2015-06-19 | DRG: 100 | Disposition: E | Payer: Medicare Other | Attending: Internal Medicine | Admitting: Internal Medicine

## 2015-06-13 ENCOUNTER — Encounter (HOSPITAL_COMMUNITY): Payer: Self-pay | Admitting: Emergency Medicine

## 2015-06-13 DIAGNOSIS — B954 Other streptococcus as the cause of diseases classified elsewhere: Secondary | ICD-10-CM | POA: Diagnosis present

## 2015-06-13 DIAGNOSIS — D649 Anemia, unspecified: Secondary | ICD-10-CM

## 2015-06-13 DIAGNOSIS — J189 Pneumonia, unspecified organism: Secondary | ICD-10-CM

## 2015-06-13 DIAGNOSIS — Z515 Encounter for palliative care: Secondary | ICD-10-CM | POA: Diagnosis present

## 2015-06-13 DIAGNOSIS — K7460 Unspecified cirrhosis of liver: Secondary | ICD-10-CM | POA: Diagnosis present

## 2015-06-13 DIAGNOSIS — R739 Hyperglycemia, unspecified: Secondary | ICD-10-CM | POA: Diagnosis present

## 2015-06-13 DIAGNOSIS — G9340 Encephalopathy, unspecified: Secondary | ICD-10-CM | POA: Diagnosis present

## 2015-06-13 DIAGNOSIS — M436 Torticollis: Secondary | ICD-10-CM | POA: Diagnosis present

## 2015-06-13 DIAGNOSIS — J9602 Acute respiratory failure with hypercapnia: Secondary | ICD-10-CM | POA: Diagnosis present

## 2015-06-13 DIAGNOSIS — G40909 Epilepsy, unspecified, not intractable, without status epilepticus: Secondary | ICD-10-CM | POA: Diagnosis present

## 2015-06-13 DIAGNOSIS — Z8673 Personal history of transient ischemic attack (TIA), and cerebral infarction without residual deficits: Secondary | ICD-10-CM

## 2015-06-13 DIAGNOSIS — E860 Dehydration: Secondary | ICD-10-CM | POA: Diagnosis present

## 2015-06-13 DIAGNOSIS — D638 Anemia in other chronic diseases classified elsewhere: Secondary | ICD-10-CM | POA: Diagnosis present

## 2015-06-13 DIAGNOSIS — D509 Iron deficiency anemia, unspecified: Secondary | ICD-10-CM | POA: Diagnosis present

## 2015-06-13 DIAGNOSIS — I5043 Acute on chronic combined systolic (congestive) and diastolic (congestive) heart failure: Secondary | ICD-10-CM | POA: Diagnosis present

## 2015-06-13 DIAGNOSIS — R569 Unspecified convulsions: Secondary | ICD-10-CM | POA: Insufficient documentation

## 2015-06-13 DIAGNOSIS — R4182 Altered mental status, unspecified: Secondary | ICD-10-CM | POA: Diagnosis not present

## 2015-06-13 DIAGNOSIS — H9190 Unspecified hearing loss, unspecified ear: Secondary | ICD-10-CM | POA: Diagnosis present

## 2015-06-13 DIAGNOSIS — I110 Hypertensive heart disease with heart failure: Secondary | ICD-10-CM | POA: Diagnosis present

## 2015-06-13 DIAGNOSIS — Z9842 Cataract extraction status, left eye: Secondary | ICD-10-CM

## 2015-06-13 DIAGNOSIS — G40301 Generalized idiopathic epilepsy and epileptic syndromes, not intractable, with status epilepticus: Secondary | ICD-10-CM

## 2015-06-13 DIAGNOSIS — Z888 Allergy status to other drugs, medicaments and biological substances status: Secondary | ICD-10-CM

## 2015-06-13 DIAGNOSIS — R410 Disorientation, unspecified: Secondary | ICD-10-CM | POA: Diagnosis not present

## 2015-06-13 DIAGNOSIS — E876 Hypokalemia: Secondary | ICD-10-CM | POA: Diagnosis present

## 2015-06-13 DIAGNOSIS — Z8249 Family history of ischemic heart disease and other diseases of the circulatory system: Secondary | ICD-10-CM

## 2015-06-13 DIAGNOSIS — E7800 Pure hypercholesterolemia, unspecified: Secondary | ICD-10-CM | POA: Diagnosis present

## 2015-06-13 DIAGNOSIS — G4089 Other seizures: Principal | ICD-10-CM | POA: Diagnosis present

## 2015-06-13 DIAGNOSIS — R509 Fever, unspecified: Secondary | ICD-10-CM

## 2015-06-13 DIAGNOSIS — I639 Cerebral infarction, unspecified: Secondary | ICD-10-CM | POA: Diagnosis present

## 2015-06-13 DIAGNOSIS — R404 Transient alteration of awareness: Secondary | ICD-10-CM | POA: Diagnosis not present

## 2015-06-13 DIAGNOSIS — D696 Thrombocytopenia, unspecified: Secondary | ICD-10-CM | POA: Diagnosis present

## 2015-06-13 DIAGNOSIS — E872 Acidosis: Secondary | ICD-10-CM | POA: Diagnosis present

## 2015-06-13 DIAGNOSIS — N390 Urinary tract infection, site not specified: Secondary | ICD-10-CM | POA: Diagnosis present

## 2015-06-13 DIAGNOSIS — G459 Transient cerebral ischemic attack, unspecified: Secondary | ICD-10-CM

## 2015-06-13 DIAGNOSIS — E871 Hypo-osmolality and hyponatremia: Secondary | ICD-10-CM | POA: Diagnosis present

## 2015-06-13 DIAGNOSIS — E785 Hyperlipidemia, unspecified: Secondary | ICD-10-CM | POA: Diagnosis present

## 2015-06-13 DIAGNOSIS — I2510 Atherosclerotic heart disease of native coronary artery without angina pectoris: Secondary | ICD-10-CM

## 2015-06-13 DIAGNOSIS — Z66 Do not resuscitate: Secondary | ICD-10-CM | POA: Diagnosis present

## 2015-06-13 DIAGNOSIS — Z951 Presence of aortocoronary bypass graft: Secondary | ICD-10-CM

## 2015-06-13 DIAGNOSIS — I10 Essential (primary) hypertension: Secondary | ICD-10-CM

## 2015-06-13 DIAGNOSIS — B9620 Unspecified Escherichia coli [E. coli] as the cause of diseases classified elsewhere: Secondary | ICD-10-CM | POA: Diagnosis present

## 2015-06-13 DIAGNOSIS — F1021 Alcohol dependence, in remission: Secondary | ICD-10-CM | POA: Diagnosis present

## 2015-06-13 DIAGNOSIS — J9601 Acute respiratory failure with hypoxia: Secondary | ICD-10-CM | POA: Diagnosis present

## 2015-06-13 DIAGNOSIS — Z9841 Cataract extraction status, right eye: Secondary | ICD-10-CM

## 2015-06-13 LAB — CBC
HCT: 37.1 % (ref 36.0–46.0)
HEMOGLOBIN: 11.9 g/dL — AB (ref 12.0–15.0)
MCH: 26 pg (ref 26.0–34.0)
MCHC: 32.1 g/dL (ref 30.0–36.0)
MCV: 81 fL (ref 78.0–100.0)
Platelets: 93 10*3/uL — ABNORMAL LOW (ref 150–400)
RBC: 4.58 MIL/uL (ref 3.87–5.11)
RDW: 25.5 % — ABNORMAL HIGH (ref 11.5–15.5)
WBC: 3.8 10*3/uL — ABNORMAL LOW (ref 4.0–10.5)

## 2015-06-13 LAB — I-STAT CHEM 8, ED
BUN: 21 mg/dL — AB (ref 6–20)
CALCIUM ION: 1.12 mmol/L — AB (ref 1.13–1.30)
CREATININE: 0.9 mg/dL (ref 0.44–1.00)
Chloride: 97 mmol/L — ABNORMAL LOW (ref 101–111)
Glucose, Bld: 206 mg/dL — ABNORMAL HIGH (ref 65–99)
HCT: 41 % (ref 36.0–46.0)
Hemoglobin: 13.9 g/dL (ref 12.0–15.0)
Potassium: 3.1 mmol/L — ABNORMAL LOW (ref 3.5–5.1)
Sodium: 134 mmol/L — ABNORMAL LOW (ref 135–145)
TCO2: 19 mmol/L (ref 0–100)

## 2015-06-13 LAB — APTT: aPTT: 30 seconds (ref 24–37)

## 2015-06-13 LAB — URINE MICROSCOPIC-ADD ON

## 2015-06-13 LAB — DIFFERENTIAL
Basophils Absolute: 0 10*3/uL (ref 0.0–0.1)
Basophils Relative: 1 %
Eosinophils Absolute: 0 10*3/uL (ref 0.0–0.7)
Eosinophils Relative: 1 %
LYMPHS PCT: 10 %
Lymphs Abs: 0.4 10*3/uL — ABNORMAL LOW (ref 0.7–4.0)
MONOS PCT: 4 %
Monocytes Absolute: 0.2 10*3/uL (ref 0.1–1.0)
NEUTROS ABS: 3.2 10*3/uL (ref 1.7–7.7)
NEUTROS PCT: 84 %

## 2015-06-13 LAB — RAPID URINE DRUG SCREEN, HOSP PERFORMED
AMPHETAMINES: NOT DETECTED
BENZODIAZEPINES: NOT DETECTED
Barbiturates: NOT DETECTED
COCAINE: NOT DETECTED
OPIATES: NOT DETECTED
Tetrahydrocannabinol: NOT DETECTED

## 2015-06-13 LAB — COMPREHENSIVE METABOLIC PANEL
ALK PHOS: 64 U/L (ref 38–126)
ALT: 22 U/L (ref 14–54)
AST: 46 U/L — AB (ref 15–41)
Albumin: 3.9 g/dL (ref 3.5–5.0)
Anion gap: 15 (ref 5–15)
BUN: 19 mg/dL (ref 6–20)
CO2: 21 mmol/L — AB (ref 22–32)
Calcium: 9.6 mg/dL (ref 8.9–10.3)
Chloride: 99 mmol/L — ABNORMAL LOW (ref 101–111)
Creatinine, Ser: 1.19 mg/dL — ABNORMAL HIGH (ref 0.44–1.00)
GFR calc non Af Amer: 39 mL/min — ABNORMAL LOW (ref >60)
GFR, EST AFRICAN AMERICAN: 45 mL/min — AB (ref >60)
Glucose, Bld: 211 mg/dL — ABNORMAL HIGH (ref 65–99)
Potassium: 3.1 mmol/L — ABNORMAL LOW (ref 3.5–5.1)
SODIUM: 135 mmol/L (ref 135–145)
Total Bilirubin: 0.7 mg/dL (ref 0.3–1.2)
Total Protein: 6.8 g/dL (ref 6.5–8.1)

## 2015-06-13 LAB — URINALYSIS, ROUTINE W REFLEX MICROSCOPIC
BILIRUBIN URINE: NEGATIVE
Glucose, UA: NEGATIVE mg/dL
Ketones, ur: NEGATIVE mg/dL
Leukocytes, UA: NEGATIVE
Nitrite: NEGATIVE
PH: 5.5 (ref 5.0–8.0)
Protein, ur: 100 mg/dL — AB
SPECIFIC GRAVITY, URINE: 1.014 (ref 1.005–1.030)

## 2015-06-13 LAB — PHENYTOIN LEVEL, TOTAL: Phenytoin Lvl: <2.5 ug/mL — ABNORMAL LOW (ref 10.0–20.0)

## 2015-06-13 LAB — PROTIME-INR
INR: 1.27 (ref 0.00–1.49)
Prothrombin Time: 16.1 seconds — ABNORMAL HIGH (ref 11.6–15.2)

## 2015-06-13 LAB — I-STAT TROPONIN, ED: TROPONIN I, POC: 0.05 ng/mL (ref 0.00–0.08)

## 2015-06-13 LAB — AMMONIA: AMMONIA: 47 umol/L — AB (ref 9–35)

## 2015-06-13 LAB — LACTIC ACID, PLASMA: LACTIC ACID, VENOUS: 1 mmol/L (ref 0.5–2.0)

## 2015-06-13 LAB — ETHANOL: Alcohol, Ethyl (B): <5 mg/dL (ref <5)

## 2015-06-13 MED ORDER — LORAZEPAM 2 MG/ML IJ SOLN: 0.5000 mg | Freq: Once | INTRAMUSCULAR | Status: DC

## 2015-06-13 MED ORDER — BISACODYL 10 MG RE SUPP: 10.0000 mg | Freq: Every day | RECTAL | Status: DC | PRN

## 2015-06-13 MED ORDER — ACETAMINOPHEN 650 MG RE SUPP: 650.0000 mg | RECTAL | Status: DC | PRN

## 2015-06-13 MED ORDER — ONDANSETRON HCL 4 MG PO TABS: 4.0000 mg | ORAL_TABLET | Freq: Four times a day (QID) | ORAL | Status: DC | PRN

## 2015-06-13 MED ORDER — LORAZEPAM 2 MG/ML IJ SOLN
1.0000 mg | Freq: Once | INTRAMUSCULAR | Status: AC
  Administered 2015-06-13: 1 mg via INTRAMUSCULAR
  Filled 2015-06-13: qty 1

## 2015-06-13 MED ORDER — SODIUM CHLORIDE 0.9 % IV SOLN
INTRAVENOUS | Status: DC
  Administered 2015-06-15: 01:00:00 via INTRAVENOUS

## 2015-06-13 MED ORDER — DEXTROSE-NACL 5-0.9 % IV SOLN: INTRAVENOUS | Status: DC

## 2015-06-13 MED ORDER — LORAZEPAM 1 MG PO TABS: 1.0000 mg | ORAL_TABLET | Freq: Once | ORAL | Status: DC

## 2015-06-13 MED ORDER — SODIUM CHLORIDE 0.9% FLUSH
3.0000 mL | Freq: Two times a day (BID) | INTRAVENOUS | Status: DC
  Administered 2015-06-13 – 2015-06-17 (×4): 3 mL via INTRAVENOUS

## 2015-06-13 MED ORDER — ONDANSETRON HCL 4 MG/2ML IJ SOLN: 4.0000 mg | Freq: Four times a day (QID) | INTRAMUSCULAR | Status: DC | PRN

## 2015-06-13 MED ORDER — CEFTRIAXONE SODIUM 1 G IJ SOLR
1.0000 g | INTRAMUSCULAR | Status: DC
  Administered 2015-06-13 – 2015-06-15 (×3): 1 g via INTRAVENOUS
  Filled 2015-06-13 (×4): qty 10

## 2015-06-13 MED ORDER — SODIUM CHLORIDE 0.9 % IV SOLN
INTRAVENOUS | Status: AC
  Administered 2015-06-13 – 2015-06-14 (×2): via INTRAVENOUS

## 2015-06-13 MED ORDER — LORAZEPAM 2 MG/ML IJ SOLN
1.0000 mg | Freq: Once | INTRAMUSCULAR | Status: AC
  Administered 2015-06-13: 1 mg via INTRAVENOUS
  Filled 2015-06-13: qty 1

## 2015-06-13 MED ORDER — SODIUM CHLORIDE 0.9 % IV BOLUS (SEPSIS)
500.0000 mL | Freq: Once | INTRAVENOUS | Status: AC
  Administered 2015-06-13: 500 mL via INTRAVENOUS

## 2015-06-13 MED ORDER — MORPHINE SULFATE (PF) 2 MG/ML IV SOLN: 1.0000 mg | INTRAVENOUS | Status: DC | PRN

## 2015-06-13 NOTE — Progress Notes (Signed)
Spoke with MRI technologist who states next available time for MRI will be after 10pm. Will notify MD of need for prn Ativan. Lawson RadarHeather M Teal Raben

## 2015-06-13 NOTE — ED Notes (Signed)
Per EMS patient was from Home. Patient daughter called her and did not get an answer so she decided to visit. Family states patient was unresponsive. Per EMS patient at baseline Alert and oriented x4.  Patient alert to self but not to place or time. Patient strength equal bilateral. Patient able to move lower extremes.

## 2015-06-13 NOTE — Consult Note (Signed)
Referring Physician: Dr. Maryfrances Bunnell    Chief Complaint: Confusion  HPI: Anne Hanna is an 82 y.o. female with a history of temporal hemorrhagic stroke, possible seizures, cirrhosis, HTN, CAD and thrombocytopenia who presented with AMS. She was recently hospitalized for acute respiratory failure. Family states she was confused at that time as well. Yesterday she was last seen to be normal at 5 PM. Her daughter subsequently found her unresponsive this AM, with concern for left facial droop. During transport by EMS, she was awake, but not following commands. She did have any fever, chest pain, cough, headache, neck stiffness or worsened SOB per daughter. Also no EtOH or drug abuse. CT head in the ED was unremarkable and urine was clear. Also with stable vitals. Neurology called to evaluate.   LSN: 5 PM yesterday tPA Given: No. Not a tPA candidate due to time criteria.   Past Medical History  Diagnosis Date  . Coronary artery disease     STATUS POST CABG  . Hypertension   . Hypercholesterolemia   . CVA (cerebral vascular accident) (HCC)     OCULAR CVA  . Hx: UTI (urinary tract infection)   . Glaucoma     History of glaucoma  . Bronchopneumonia     Right lung  . Urinary tract infection     Escherichia coli urinary tract infection  . Acute sinusitis   . Thrombocytopenia (HCC) 2010     platelet count of 96 at discharge (95 on admission)  . Hyperlipidemia   . History of transient ischemic attack   . Hyponatremia     volume depletion - resolved  . Volume overload     History of postoperative volume overload  . Delirium     Postoperative delirium  . Cataract   . CVA (cerebral infarction)     temporal hemorhage  . Gallstones   . Hepatic cyst   . Cirrhosis (HCC)     per endo 2015    Past Surgical History  Procedure Laterality Date  . Cardiac catheterization  10/05/2006    NORMAL LEFT VENTRICULAR SIZE. THERE IS MODERATE GLOBAL HYPOKINESIA WITH OVERALL EF 40%  . Coronary artery  bypass graft  09/2006    LIMA GRAFT TO THE LAD, SAPHENOUS VEIN GRAFT TO THE FIRST OBTUSE MARGINAL VESSEL WITH SEQUENTIAL GRAFT TO THE SECOND MARGINAL VESSEL , AND SAPHENOUS VEIN GRAFT TO THE PDA  . Cataract surgery      Bilateral cataract extraction-- x2  . Tendon repair      Status post repair of tendon of the left index finger  . Esophagogastroduodenoscopy N/A 04/22/2013    Procedure: ESOPHAGOGASTRODUODENOSCOPY (EGD);  Surgeon: Willis Modena, MD;  Location: [facility] ENDOSCOPY;  Service: Endoscopy;  Laterality: N/A;    Family History  Problem Relation Age of Onset  . Heart failure Mother   . Heart failure Father   . Heart disease Brother    Social History:  reports that she has never smoked. She has never used smokeless tobacco. She reports that she does not drink alcohol or use illicit drugs.  Allergies:  Allergies  Allergen Reactions  . Dilantin [Phenytoin]   . Amlodipine Swelling  . Lipitor [Atorvastatin Calcium]     HEADACHES    Medications:  Current facility-administered medications:  .  0.9 %  sodium chloride infusion, , Intravenous, Continuous, Sung Amabile Wertman, PA-C .  0.9 %  sodium chloride infusion, , Intravenous, Continuous, Alberteen Sam, MD .  acetaminophen (TYLENOL) suppository 650 mg, 650 mg, Rectal,  Q4H PRN, Roma Kayser Schorr, NP .  bisacodyl (DULCOLAX) suppository 10 mg, 10 mg, Rectal, Daily PRN, Marcos Eke, PA-C .  cefTRIAXone (ROCEPHIN) 1 g in dextrose 5 % 50 mL IVPB, 1 g, Intravenous, Q24H, Christopher P Danford, MD .  dextrose 5 %-0.9 % sodium chloride infusion, , Intravenous, Continuous, Caryl Pina, MD .  morphine 2 MG/ML injection 1 mg, 1 mg, Intravenous, Q4H PRN, Marcos Eke, PA-C .  ondansetron (ZOFRAN) tablet 4 mg, 4 mg, Oral, Q6H PRN **OR** ondansetron (ZOFRAN) injection 4 mg, 4 mg, Intravenous, Q6H PRN, Marcos Eke, PA-C .  sodium chloride flush (NS) 0.9 % injection 3 mL, 3 mL, Intravenous, Q12H, Sara E Wertman, PA-C   ROS: As per HPI.    Physical Examination: Blood pressure 145/62, pulse 88, temperature 97.9 F (36.6 C), temperature source Oral, resp. rate 20, SpO2 99 %.  Neurologic Examination: General: NAD Mental Status: Somnolent in the context of recent benzodiazepine administration for MRI. Follows some commands. Exclaims to noxious stimuli with short statements that are grammatically correct.  Cranial Nerves: II:  PERRL. Does not cooperate with visual fields.  Does not cooperate with visual pursuits or testing of facial sensation. Right facial droop noted. Does not protrude tongue. No dysphonia noted.  Motor: Moves all 4 extremities equally to noxious. Unable to formally assess strength due to sedation and lack of cooperation.  Sensory: Pinprick and light touch intact throughout, bilaterally Deep Tendon Reflexes: Normoactive x 4. Equivocal Babinski bilaterally.  Cerebellar: Does not cooperate.  Gait: Unable to assess.    Results for orders placed or performed during the hospital encounter of 05/27/2015 (from the past 48 hour(s))  I-stat troponin, ED (not at [facility], [facility])     Status: None   Collection Time: 06/01/2015 10:45 AM  Result Value Ref Range   Troponin i, poc 0.05 0.00 - 0.08 ng/mL   Comment 3            Comment: Due to the release kinetics of cTnI, a negative result within the first hours of the onset of symptoms does not rule out myocardial infarction with certainty. If myocardial infarction is still suspected, repeat the test at appropriate intervals.   I-Stat Chem 8, ED  (not at [facility] [address], [facility])     Status: Abnormal   Collection Time: 06/06/2015 10:47 AM  Result Value Ref Range   Sodium 134 (L) 135 - 145 mmol/L   Potassium 3.1 (L) 3.5 - 5.1 mmol/L   Chloride 97 (L) 101 - 111 mmol/L   BUN 21 (H) 6 - 20 mg/dL   Creatinine, Ser 3.54 0.44 - 1.00 mg/dL   Glucose, Bld 301 (H) 65 - 99 mg/dL   Calcium, Ion 4.84 (L) 1.13 - 1.30 mmol/L   TCO2 19 0 - 100 mmol/L   Hemoglobin 13.9 12.0 - 15.0 g/dL   HCT 03.9 79.5 -  36.9 %  Ethanol     Status: None   Collection Time: 05/20/2015 10:48 AM  Result Value Ref Range   Alcohol, Ethyl (B) <5 <5 mg/dL    Comment:        LOWEST DETECTABLE LIMIT FOR SERUM ALCOHOL IS 5 mg/dL FOR MEDICAL PURPOSES ONLY   Protime-INR     Status: Abnormal   Collection Time: 06/06/2015 10:49 AM  Result Value Ref Range   Prothrombin Time 16.1 (H) 11.6 - 15.2 seconds   INR 1.27 0.00 - 1.49  APTT     Status: None   Collection Time: 05/27/2015 10:49  AM  Result Value Ref Range   aPTT 30 24 - 37 seconds  CBC     Status: Abnormal   Collection Time: 06/07/2015 10:49 AM  Result Value Ref Range   WBC 3.8 (L) 4.0 - 10.5 K/uL   RBC 4.58 3.87 - 5.11 MIL/uL   Hemoglobin 11.9 (L) 12.0 - 15.0 g/dL   HCT 37.1 36.0 - 46.0 %   MCV 81.0 78.0 - 100.0 fL   MCH 26.0 26.0 - 34.0 pg   MCHC 32.1 30.0 - 36.0 g/dL   RDW 25.5 (H) 11.5 - 15.5 %   Platelets 93 (L) 150 - 400 K/uL    Comment: CONSISTENT WITH PREVIOUS RESULT  Differential     Status: Abnormal   Collection Time: 06/05/2015 10:49 AM  Result Value Ref Range   Neutrophils Relative % 84 %   Lymphocytes Relative 10 %   Monocytes Relative 4 %   Eosinophils Relative 1 %   Basophils Relative 1 %   Neutro Abs 3.2 1.7 - 7.7 K/uL   Lymphs Abs 0.4 (L) 0.7 - 4.0 K/uL   Monocytes Absolute 0.2 0.1 - 1.0 K/uL   Eosinophils Absolute 0.0 0.0 - 0.7 K/uL   Basophils Absolute 0.0 0.0 - 0.1 K/uL   RBC Morphology ELLIPTOCYTES   Comprehensive metabolic panel     Status: Abnormal   Collection Time: 05/31/2015 10:49 AM  Result Value Ref Range   Sodium 135 135 - 145 mmol/L   Potassium 3.1 (L) 3.5 - 5.1 mmol/L   Chloride 99 (L) 101 - 111 mmol/L   CO2 21 (L) 22 - 32 mmol/L   Glucose, Bld 211 (H) 65 - 99 mg/dL   BUN 19 6 - 20 mg/dL   Creatinine, Ser 1.19 (H) 0.44 - 1.00 mg/dL   Calcium 9.6 8.9 - 10.3 mg/dL   Total Protein 6.8 6.5 - 8.1 g/dL   Albumin 3.9 3.5 - 5.0 g/dL   AST 46 (H) 15 - 41 U/L   ALT 22 14 - 54 U/L   Alkaline Phosphatase 64 38 - 126 U/L    Total Bilirubin 0.7 0.3 - 1.2 mg/dL   GFR calc non Af Amer 39 (L) >60 mL/min   GFR calc Af Amer 45 (L) >60 mL/min    Comment: (NOTE) The eGFR has been calculated using the CKD EPI equation. This calculation has not been validated in all clinical situations. eGFR's persistently <60 mL/min signify possible Chronic Kidney Disease.    Anion gap 15 5 - 15  Urine rapid drug screen (hosp performed)not at [facility]     Status: None   Collection Time: 06/16/2015 11:30 AM  Result Value Ref Range   Opiates NONE DETECTED NONE DETECTED   Cocaine NONE DETECTED NONE DETECTED   Benzodiazepines NONE DETECTED NONE DETECTED   Amphetamines NONE DETECTED NONE DETECTED   Tetrahydrocannabinol NONE DETECTED NONE DETECTED   Barbiturates NONE DETECTED NONE DETECTED    Comment:        DRUG SCREEN FOR MEDICAL PURPOSES ONLY.  IF CONFIRMATION IS NEEDED FOR ANY PURPOSE, NOTIFY LAB WITHIN 5 DAYS.        LOWEST DETECTABLE LIMITS FOR URINE DRUG SCREEN Drug Class       Cutoff (ng/mL) Amphetamine      1000 Barbiturate      200 Benzodiazepine   161 Tricyclics       096 Opiates          300 Cocaine  300 THC              50   Urinalysis, Routine w reflex microscopic (not at [facility])     Status: Abnormal   Collection Time: 06/09/2015 11:30 AM  Result Value Ref Range   Color, Urine YELLOW YELLOW   APPearance CLOUDY (A) CLEAR   Specific Gravity, Urine 1.014 1.005 - 1.030   pH 5.5 5.0 - 8.0   Glucose, UA NEGATIVE NEGATIVE mg/dL   Hgb urine dipstick MODERATE (A) NEGATIVE   Bilirubin Urine NEGATIVE NEGATIVE   Ketones, ur NEGATIVE NEGATIVE mg/dL   Protein, ur 100 (A) NEGATIVE mg/dL   Nitrite NEGATIVE NEGATIVE   Leukocytes, UA NEGATIVE NEGATIVE  Urine microscopic-add on     Status: Abnormal   Collection Time: 06/08/2015 11:30 AM  Result Value Ref Range   Squamous Epithelial / LPF 0-5 (A) NONE SEEN   WBC, UA 0-5 0 - 5 WBC/hpf   RBC / HPF 0-5 0 - 5 RBC/hpf   Bacteria, UA MANY (A) NONE SEEN  Lactic acid, plasma      Status: None   Collection Time: 05/25/2015  3:55 PM  Result Value Ref Range   Lactic Acid, Venous 1.0 0.5 - 2.0 mmol/L  Ammonia     Status: Abnormal   Collection Time: 05/20/2015  3:55 PM  Result Value Ref Range   Ammonia 47 (H) 9 - 35 umol/L  Phenytoin level, total     Status: Abnormal   Collection Time: 06/08/2015  3:55 PM  Result Value Ref Range   Phenytoin Lvl <2.5 (L) 10.0 - 20.0 ug/mL   Ct Head Wo Contrast  06/05/2015  CLINICAL DATA:  Altered mental status. Unequal pupils. Left-sided facial droop. EXAM: CT HEAD WITHOUT CONTRAST TECHNIQUE: Contiguous axial images were obtained from the base of the skull through the vertex without intravenous contrast. COMPARISON:  03/04/2012; brain MRI- 03/05/2012 FINDINGS: Examination is degraded due to patient motion artifact and patient's inability to cooperate with instructions. Re- demonstrated advanced atrophy with sulcal prominence centralized volume loss with commensurate ex vacuo dilatation of the ventricular system. Extensive periventricular hypodensities compatible with microvascular ischemic disease. Given background parenchymal abnormalities and patient motion, there is no definitive since CT evidence of acute superimposed large territory infarct. No intraparenchymal or extra-axial mass or hemorrhage. Unchanged size and configuration of the ventricles and basilar cisterns. Mild (approximately 6 mm) of left-to-right midline shift is unchanged since the 02/2012 examination and likely attributable to asymmetric atrophy. Intracranial atherosclerosis. Limited visualization of the paranasal sinuses and mastoid air cells is normal. No air-fluid levels. Regional soft tissues appear normal. Post bilateral cataract surgery. No definitive displaced calvarial fracture. IMPRESSION: Similar findings of advanced atrophy and microvascular ischemic disease without definitive superimposed acute large territory infarct on this motion degraded examination. Electronically  Signed   By: Sandi Mariscal M.D.   On: 05/27/2015 12:51    Assessment: 82 y.o. female with acute onset of confusion and right facial droop 1. Possible left hemispheric stroke involving the perisylvian cortices and motor tracts to the face. Also on DDx would be confusion secondary to toxic/metabolic etiology, as well as unwitnessed seizure. Family denies dementia, but incipient Lewy body disease could present with cognitive fluctuations.  2. Prior history of hemorrhagic stroke.  3. Hepatic cirrhosis. 4. Questionable prior history of seizure. Has an allergy to Dilantin.   Plan: 1. EEG. 2. MRI, MRA  of the brain without contrast 3. PT consult, OT consult, Speech consult 4. Echocardiogram 5. Carotid dopplers 6.  Start ASA.  7. Risk factor modification 8. Telemetry monitoring 9. Frequent neuro checks 10. HgbA1c, fasting lipid panel  '@Electronically'$  signed: Dr. Kerney Elbe  '@strokeconsult'$ @ 06/11/2015, 7:34 PM

## 2015-06-13 NOTE — Progress Notes (Signed)
Contacted the daughter Rayann HemanLynda Norris at 22:25 to advise her that we had to put patient in soft wrist restraints due to her ripping out several iv lines and smacking at staff.....daughter said, "that doesn't surprise me we had to do that when she had her last stroke."  Family was understanding and I answered all questions fully.

## 2015-06-13 NOTE — ED Provider Notes (Signed)
CSN: [id number]     Arrival date & time Jun 24, 2015  1011 History   First MD Initiated Contact with Patient 2015/06/24 1015     Chief Complaint  Patient presents with  . Weakness     (Consider location/radiation/quality/duration/timing/severity/associated sxs/prior Treatment) HPI Comments: 82yo F w/ extensive PMH including CVA, CAD s/p CABG, HTN who p/w altered mental status. According to patient's daughter, she spoke with her last night and was normal on the phone around 5 PM. This morning daughter tried to call the patient and was unable to get ahold of her so she went to her house to check on her. She found the patient confused, altered, with concern for a facial droop. EMS felt that the facial droop improved and then returned. She has been unable to follow most commands but has been awake and alert during transport.  LEVEL 5 CAVEAT 2/2 ALTERED MENTAL STATUS  Patient is a 82 y.o. female presenting with weakness. The history is provided by the EMS personnel.  Weakness    Past Medical History  Diagnosis Date  . Coronary artery disease     STATUS POST CABG  . Hypertension   . Hypercholesterolemia   . CVA (cerebral vascular accident) (HCC)     OCULAR CVA  . Hx: UTI (urinary tract infection)   . Glaucoma     History of glaucoma  . Bronchopneumonia     Right lung  . Urinary tract infection     Escherichia coli urinary tract infection  . Acute sinusitis   . Thrombocytopenia (HCC) 2010     platelet count of 96 at discharge (95 on admission)  . Hyperlipidemia   . History of transient ischemic attack   . Hyponatremia     volume depletion - resolved  . Volume overload     History of postoperative volume overload  . Delirium     Postoperative delirium  . Cataract   . CVA (cerebral infarction)     temporal hemorhage  . Gallstones   . Hepatic cyst   . Cirrhosis (HCC)     per endo 2015   Past Surgical History  Procedure Laterality Date  . Cardiac catheterization  10/05/2006   NORMAL LEFT VENTRICULAR SIZE. THERE IS MODERATE GLOBAL HYPOKINESIA WITH OVERALL EF 40%  . Coronary artery bypass graft  09/2006    LIMA GRAFT TO THE LAD, SAPHENOUS VEIN GRAFT TO THE FIRST OBTUSE MARGINAL VESSEL WITH SEQUENTIAL GRAFT TO THE SECOND MARGINAL VESSEL , AND SAPHENOUS VEIN GRAFT TO THE PDA  . Cataract surgery      Bilateral cataract extraction-- x2  . Tendon repair      Status post repair of tendon of the left index finger  . Esophagogastroduodenoscopy N/A 04/22/2013    Procedure: ESOPHAGOGASTRODUODENOSCOPY (EGD);  Surgeon: Willis Modena, MD;  Location: [facility] ENDOSCOPY;  Service: Endoscopy;  Laterality: N/A;   Family History  Problem Relation Age of Onset  . Heart failure Mother   . Heart failure Father   . Heart disease Brother    Social History  Substance Use Topics  . Smoking status: Never Smoker   . Smokeless tobacco: Never Used  . Alcohol Use: No   OB History    No data available     Review of Systems  Unable to perform ROS: Mental status change  Neurological: Positive for weakness.      Allergies  Dilantin; Amlodipine; and Lipitor  Home Medications   Prior to Admission medications   Medication Sig Start Date End  Date Taking? Authorizing Provider  aspirin 81 MG tablet Take 81 mg by mouth daily.    Historical Provider, MD  ferrous sulfate 325 (65 FE) MG tablet Take 1 tablet (325 mg total) by mouth daily with breakfast. 05/28/15   Osvaldo Shipper, MD  hydrochlorothiazide 25 MG tablet Take 25 mg by mouth daily.      Historical Provider, MD  LATANOPROST OP Apply 1 drop to eye daily. One drop in each eye daily.    Historical Provider, MD  levETIRAcetam (KEPPRA) 250 MG tablet Take 1 tablet (250 mg total) by mouth 2 (two) times daily. 11/04/14   Nilda Riggs, NP  loratadine (CLARITIN) 10 MG tablet Take 10 mg by mouth daily.    Historical Provider, MD  nebivolol (BYSTOLIC) 10 MG tablet Take 10 mg by mouth daily.    Historical Provider, MD  nitroGLYCERIN  (NITROSTAT) 0.4 MG SL tablet Place 1 tablet (0.4 mg total) under the tongue every 5 (five) minutes as needed. 04/12/12   Peter M [place], MD  omeprazole (PRILOSEC) 20 MG capsule Take 20 mg by mouth daily. Take 1 tab by mouth daily 04/12/15   Historical Provider, MD  pseudoephedrine-guaifenesin (MUCINEX D) 60-600 MG 12 hr tablet Take 1 tablet by mouth daily.    Historical Provider, MD  rosuvastatin (CRESTOR) 10 MG tablet Take 1 tablet (10 mg total) by mouth daily. 02/22/15   Peter M [place], MD  Timolol Maleate (ISTALOL) 0.5 % (DAILY) SOLN Place 1 drop into both eyes daily.    Historical Provider, MD   There were no vitals taken for this visit. Physical Exam  Constitutional: She appears well-developed and well-nourished. No distress.  Frail elderly woman, awake, comfortable  HENT:  Head: Normocephalic and atraumatic.  Moist mucous membranes, subtle left facial droop at mouth  Eyes: Conjunctivae and EOM are normal.  R pupil 3mm and reactive, L pupil 2mm and reactive  Neck: Neck supple.  Cardiovascular: Normal rate, regular rhythm and normal heart sounds.   No murmur heard. Pulmonary/Chest: Effort normal and breath sounds normal.  Abdominal: Soft. Bowel sounds are normal. There is no tenderness.  Large abdominal wall hernia  Musculoskeletal: She exhibits no edema or tenderness.  Neurological: She is alert.  Oriented to person, mild confusion and difficulty following commands, left facial droop at mouth, 5/5 strength x all 4 ext  Skin: Skin is warm and dry.  Nursing note and vitals reviewed.   ED Course  Procedures (including critical care time) Labs Review Labs Reviewed  PROTIME-INR - Abnormal; Notable for the following:    Prothrombin Time 16.1 (*)    All other components within normal limits  CBC - Abnormal; Notable for the following:    WBC 3.8 (*)    Hemoglobin 11.9 (*)    RDW 25.5 (*)    Platelets 93 (*)    All other components within normal limits  DIFFERENTIAL - Abnormal;  Notable for the following:    Lymphs Abs 0.4 (*)    All other components within normal limits  COMPREHENSIVE METABOLIC PANEL - Abnormal; Notable for the following:    Potassium 3.1 (*)    Chloride 99 (*)    CO2 21 (*)    Glucose, Bld 211 (*)    Creatinine, Ser 1.19 (*)    AST 46 (*)    GFR calc non Af Amer 39 (*)    GFR calc Af Amer 45 (*)    All other components within normal limits  URINALYSIS, ROUTINE W  REFLEX MICROSCOPIC (NOT AT [facility]) - Abnormal; Notable for the following:    APPearance CLOUDY (*)    Hgb urine dipstick MODERATE (*)    Protein, ur 100 (*)    All other components within normal limits  URINE MICROSCOPIC-ADD ON - Abnormal; Notable for the following:    Squamous Epithelial / LPF 0-5 (*)    Bacteria, UA MANY (*)    All other components within normal limits  I-STAT CHEM 8, ED - Abnormal; Notable for the following:    Sodium 134 (*)    Potassium 3.1 (*)    Chloride 97 (*)    BUN 21 (*)    Glucose, Bld 206 (*)    Calcium, Ion 1.12 (*)    All other components within normal limits  ETHANOL  APTT  URINE RAPID DRUG SCREEN, HOSP PERFORMED  LACTIC ACID, PLASMA  AMMONIA  PHENYTOIN LEVEL, TOTAL  HEMOGLOBIN A1C  I-STAT TROPOININ, ED    Imaging Review Ct Head Wo Contrast  06/01/2015  CLINICAL DATA:  Altered mental status. Unequal pupils. Left-sided facial droop. EXAM: CT HEAD WITHOUT CONTRAST TECHNIQUE: Contiguous axial images were obtained from the base of the skull through the vertex without intravenous contrast. COMPARISON:  03/04/2012; brain MRI- 03/05/2012 FINDINGS: Examination is degraded due to patient motion artifact and patient's inability to cooperate with instructions. Re- demonstrated advanced atrophy with sulcal prominence centralized volume loss with commensurate ex vacuo dilatation of the ventricular system. Extensive periventricular hypodensities compatible with microvascular ischemic disease. Given background parenchymal abnormalities and patient motion,  there is no definitive since CT evidence of acute superimposed large territory infarct. No intraparenchymal or extra-axial mass or hemorrhage. Unchanged size and configuration of the ventricles and basilar cisterns. Mild (approximately 6 mm) of left-to-right midline shift is unchanged since the 02/2012 examination and likely attributable to asymmetric atrophy. Intracranial atherosclerosis. Limited visualization of the paranasal sinuses and mastoid air cells is normal. No air-fluid levels. Regional soft tissues appear normal. Post bilateral cataract surgery. No definitive displaced calvarial fracture. IMPRESSION: Similar findings of advanced atrophy and microvascular ischemic disease without definitive superimposed acute large territory infarct on this motion degraded examination. Electronically Signed   By: Simonne ComeJohn  Watts M.D.   On: 05/31/2015 12:51   I have personally reviewed and evaluated these lab results as part of my medical decision-making.   EKG Interpretation   Date/Time:  Sunday June 13 2015 11:05:27 EDT Ventricular Rate:  91 PR Interval:  193 QRS Duration: 110 QT Interval:  415 QTC Calculation: 511 R Axis:   84 Text Interpretation:  Sinus rhythm Borderline right axis deviation  Probable left ventricular hypertrophy Prolonged QT interval T waves in  V4-V6 have normalized since previous tracing Confirmed by LITTLE MD,  RACHEL ([id number]) on 05/27/2015 11:56:57 AM      MDM   Final diagnoses:  Altered mental status, unspecified altered mental status type   Pt who was last normal at 5pm last night brought in by EMS for AMS and convern for facial droop noted by daughter this morning. On arrival, she was awake and alert, oriented to person. She had difficulty following commands. Pupils were slightly unequal but reactive, mild left facial droop at mouth, equal strength throughout. Obtained above lab work as well as head CT.  Head CT was negative for acute process. EKG without acute ischemic  changes. Lab work shows no evidence of infection, stable CMP, no abnormalities to explain the patient's confusion. Because of my concern for stroke, I contacted neurology and discussed  with Dr. Otelia Limes, who will see pt in consultation. I have ordered a brain MRI. I discussed admission with triad, PA Durenda Age, and pt admitted for further care.   Laurence Spates, MD 07-07-2015 [phone]

## 2015-06-13 NOTE — ED Notes (Signed)
Admitting advised patient did not get her MRI due to increase confusion and unable to be still.

## 2015-06-13 NOTE — ED Notes (Signed)
Attempted report 

## 2015-06-13 NOTE — H&P (Signed)
[facility] History and Physical  Anne VELTRI MWN:[id number] DOB: [birth year] DOA: 07/08/2015  Referring physician:  PCP: [name], MD   Chief Complaint: Confusion  HPI: Anne Hanna is a 82 y.o. female with a history of CAD, HTN, temporal  hemorrhagic stroke in 2013, chronic thrombocytopenia, chronic liver cirrhosis, recently hospitalized from 3/8-3/10 due to acute respiratory failure and severe anemia with Fe deficiency requiring transfusion, and possible history of seizures, presenting today with acute mental status changes. She was last well at 5 pm. Daughter found patient unresponsive this morning, and was concerned of left facial droop, which may have improved since. She called EMS. During transport she was not following commands, but was awake. No significant improvement on arrival although is more alert.  Per daughter report, no apparent dysuria, frequency, chest pain, worsening shortness of breath or cough, back pain, rash, fevers, cough, headache, neck stiffness. She had increased salivary secretions during MRI today.  No recent ETOH or recreational drug. No bleeding issues. No other sick contacts since last hospitalizations. She is compliant with meds, no new medications.  At the ED, CT head was normal. Urine Clear. VSS, A febrile.no apparent signs of infection. Tn 0.57. CBC remarkable for plt 93 at her baseline, no bleeding issues. Sodium is normal, potassium 3.1, creatinine normal at 1.19, glucose 211. LFTs normal. Neuro involved, awaiting recommendations. MRI brain ordered.    Review of Systems:  See HPI for significant positives  All other systems were reviewed and are negative.  Past Medical History  Diagnosis Date  . Coronary artery disease     STATUS POST CABG  . Hypertension   . Hypercholesterolemia   . CVA (cerebral vascular accident) (HCC)     OCULAR CVA  . Hx: UTI (urinary tract infection)   . Glaucoma     History of glaucoma  . Bronchopneumonia     Right lung  . Urinary tract infection     Escherichia coli urinary tract infection  . Acute sinusitis   . Thrombocytopenia (HCC) 2010     platelet count of 96 at discharge (95 on admission)  . Hyperlipidemia   . History of transient ischemic attack   . Hyponatremia     volume depletion - resolved  . Volume overload     History of postoperative volume overload  . Delirium     Postoperative delirium  . Cataract   . CVA (cerebral infarction)     temporal hemorhage  . Gallstones   . Hepatic cyst   . Cirrhosis (HCC)     per endo 2015   Past Surgical History  Procedure Laterality Date  . Cardiac catheterization  10/05/2006    NORMAL LEFT VENTRICULAR SIZE. THERE IS MODERATE GLOBAL HYPOKINESIA WITH OVERALL EF 40%  . Coronary artery bypass graft  09/2006    LIMA GRAFT TO THE LAD, SAPHENOUS VEIN GRAFT TO THE FIRST OBTUSE MARGINAL VESSEL WITH SEQUENTIAL GRAFT TO THE SECOND MARGINAL VESSEL , AND SAPHENOUS VEIN GRAFT TO THE PDA  . Cataract surgery      Bilateral cataract extraction-- x2  . Tendon repair      Status post repair of tendon of the left index finger  . Esophagogastroduodenoscopy N/A 04/22/2013    Procedure: ESOPHAGOGASTRODUODENOSCOPY (EGD);  Surgeon: Willis Modena, MD;  Location: [facility] ENDOSCOPY;  Service: Endoscopy;  Laterality: N/A;   Social History:  reports that she has never smoked. She has never used smokeless tobacco. She reports that she does not drink alcohol or use illicit drugs.  Allergies  Allergen Reactions  . Dilantin [Phenytoin]   . Amlodipine Swelling  . Lipitor [Atorvastatin Calcium]     HEADACHES    Family History  Problem Relation Age of Onset  . Heart failure Mother   . Heart failure Father   . Heart disease Brother      Prior to Admission medications   Medication Sig Start Date End Date Taking? Authorizing Provider  aspirin 81 MG tablet Take 81 mg by mouth daily.   Yes Historical Provider, MD  ferrous sulfate 325 (65 FE) MG tablet Take 1 tablet  (325 mg total) by mouth daily with breakfast. 05/28/15  Yes Osvaldo Shipper, MD  hydrochlorothiazide 25 MG tablet Take 25 mg by mouth daily.     Yes Historical Provider, MD  LATANOPROST OP Apply 1 drop to eye daily. One drop in each eye daily.   Yes Historical Provider, MD  levETIRAcetam (KEPPRA) 250 MG tablet Take 1 tablet (250 mg total) by mouth 2 (two) times daily. 11/04/14  Yes Nilda Riggs, NP  loratadine (CLARITIN) 10 MG tablet Take 10 mg by mouth daily.   Yes Historical Provider, MD  nebivolol (BYSTOLIC) 10 MG tablet Take 10 mg by mouth daily.   Yes Historical Provider, MD  omeprazole (PRILOSEC) 20 MG capsule Take 20 mg by mouth daily. Take 1 tab by mouth daily 04/12/15  Yes Historical Provider, MD  pseudoephedrine-guaifenesin (MUCINEX D) 60-600 MG 12 hr tablet Take 1 tablet by mouth daily.   Yes Historical Provider, MD  rosuvastatin (CRESTOR) 10 MG tablet Take 1 tablet (10 mg total) by mouth daily. 02/22/15  Yes Peter M [place], MD  Timolol Maleate (ISTALOL) 0.5 % (DAILY) SOLN Place 1 drop into both eyes daily.   Yes Historical Provider, MD  nitroGLYCERIN (NITROSTAT) 0.4 MG SL tablet Place 1 tablet (0.4 mg total) under the tongue every 5 (five) minutes as needed. 04/12/12   Peter M [place], MD   Physical Exam: Filed Vitals:   06/12/2015 1028 05/22/2015 1313 06/17/2015 1330  BP: 150/72 132/65 126/61  Pulse: 93 77 74  Temp: 97.9 F (36.6 C)    TempSrc: Oral    Resp: 14  17  SpO2: 97% 97% 98%    Wt Readings from Last 3 Encounters:  05/27/15 57.844 kg (127 lb 8.4 oz)  05/03/15 56.841 kg (125 lb 5 oz)  11/04/14 59.693 kg (131 lb 9.6 oz)    General: confused, rouses to touch, not to sound, eyes closed Eyes:  PERRL, EOMI, normal lids, iris ENT: Normal lips & tongue, some salivary secretions noted Neck: no lymphadenopathy, masses or thyromegaly Cardiovascular: regular rate and rythm, no murmurs, rubs or gallops. No lower extremity edema   Respiratory: clear to auscultation bilaterally,  no wheezing, rhonhci or rales. Normal respiratory effort. Abdomen: soft,non-tender, normal bowel sounds Skin: no rash or induration seen on limited exam. No open lesions. Musculoskeletal:  grossly normal tone in both upper and lower extremities Psychiatric: grossly normal mood and affect, speech fluent and appropriate Neurologic: CN 2-12 grossly intact, moves all extremities in coordinated fashion.          Labs on Admission:  Basic Metabolic Panel:  Recent Labs Lab 05/27/2015 1047 06/03/2015 1049  NA 134* 135  K 3.1* 3.1*  CL 97* 99*  CO2  --  21*  GLUCOSE 206* 211*  BUN 21* 19  CREATININE 0.90 1.19*  CALCIUM  --  9.6    Liver Function Tests:  Recent Labs Lab 06/12/2015 1049  AST  46*  ALT 22  ALKPHOS 64  BILITOT 0.7  PROT 6.8  ALBUMIN 3.9   No results for input(s): LIPASE, AMYLASE in the last 168 hours. No results for input(s): AMMONIA in the last 168 hours.  CBC:  Recent Labs Lab 06/12/2015 1047 05/31/2015 1049  WBC  --  3.8*  NEUTROABS  --  3.2  HGB 13.9 11.9*  HCT 41.0 37.1  MCV  --  81.0  PLT  --  93*    Cardiac Enzymes: No results for input(s): CKTOTAL, CKMB, CKMBINDEX, TROPONINI in the last 168 hours.  BNP (last 3 results)  Recent Labs  05/26/15 2211  BNP 1485.7*    ProBNP (last 3 results) No results for input(s): PROBNP in the last 8760 hours.   Creatinine clearance cannot be calculated (Unknown ideal weight.)  CBG: No results for input(s): GLUCAP in the last 168 hours.  Radiological Exams on Admission: Ct Head Wo Contrast  06/09/2015  CLINICAL DATA:  Altered mental status. Unequal pupils. Left-sided facial droop. EXAM: CT HEAD WITHOUT CONTRAST TECHNIQUE: Contiguous axial images were obtained from the base of the skull through the vertex without intravenous contrast. COMPARISON:  03/04/2012; brain MRI- 03/05/2012 FINDINGS: Examination is degraded due to patient motion artifact and patient's inability to cooperate with instructions. Re-  demonstrated advanced atrophy with sulcal prominence centralized volume loss with commensurate ex vacuo dilatation of the ventricular system. Extensive periventricular hypodensities compatible with microvascular ischemic disease. Given background parenchymal abnormalities and patient motion, there is no definitive since CT evidence of acute superimposed large territory infarct. No intraparenchymal or extra-axial mass or hemorrhage. Unchanged size and configuration of the ventricles and basilar cisterns. Mild (approximately 6 mm) of left-to-right midline shift is unchanged since the 02/2012 examination and likely attributable to asymmetric atrophy. Intracranial atherosclerosis. Limited visualization of the paranasal sinuses and mastoid air cells is normal. No air-fluid levels. Regional soft tissues appear normal. Post bilateral cataract surgery. No definitive displaced calvarial fracture. IMPRESSION: Similar findings of advanced atrophy and microvascular ischemic disease without definitive superimposed acute large territory infarct on this motion degraded examination. Electronically Signed   By: Simonne Come M.D.   On: 05/19/2015 12:51    EKG: Independently reviewed.    Assessment/Plan Principal Problem:   Altered mental status Active Problems:   Coronary artery disease   Hypertension   Hypercholesterolemia   CVA (cerebral vascular accident) (HCC)   Seizure (HCC)   Anemia   Cirrhosis (HCC)   Acute Confusional State Etiology unclear. Differential diagnosis include but not limited to seizure activity vs. CVA vs metabolic. CT head is negative for acute intracranial abnormalities. Afebrile no signs of infectious process. MRI unable to be performed at this moment due to increased confusion and secretions requiring Ativan. Patient reportedly has history of seizures in 2013, currently on Keppra. Urine drug screen negative Admit to tele observation  Neuro to see. Not likely tPA candidate  MRI as soon  as time allows to rule out CVA NPO for now until passing swallow evaluation Ammonia level.  Seizure precautions Frequent neuro check A1C Lactic acid Blood cultures BMET and CBG monitoring Hold home oral meds including sedative medications PT/OT  History of liver cirrhosis with thrombocytopenia. Prior history of ETOH.  BIli normal at 0.8, plts at baseline 93 ETOH negative,  No bleeding issues reported Monitor CBCs  Presumed history of seizures. No apparent seizure this admission. Dilantin level Continue Keppra  Chronic kidney disease   Lab Results  Component Value Date   CREATININE 1.19*  06/17/2015   CREATININE 0.90 06/07/2015   CREATININE 1.72* 05/28/2015  -  Minimize nephrotoxins and renally dose medications Monitor Cr.   Hyperlipidemia    Continue home statins  HYperglycemia No history of DM.  Current blood sugar level is 219 Lab Results  Component Value Date   HGBA1C 5.7* 03/05/2012  Hgb A1C    CBG   Anemia of chronic disease, Iron deficiency with recent hospitalization with a Hb of 5.5 requiring 4 units transfusion, current Hb at 11.9, no apaprent bleeding issues,. MCV 81.EGD 2015 small gastric varices.  She is on Iron supplement at home  -Transfuse 1 unit packed red blood cells for Hb below 7 or if acutely bleeding   Code Status: DNR DVT Prophylaxis: pending Neuro recommendations Family Communication: at bedside Disposition Plan: Pending Improvement. Admitted for observation in tele bed. Expected LOS 24-48 hrs     Sabine Medical CenterWERTMAN,Myrka Sylva E,PA-C [facility] [email or web address] Password [email or web address]

## 2015-06-13 NOTE — ED Notes (Signed)
MD aware patient has increase secretions during scan. MD advised to continue scan if can. MRI made aware.

## 2015-06-13 NOTE — ED Notes (Signed)
Patient sent to scan by another staff member. 

## 2015-06-13 NOTE — ED Notes (Signed)
Patient still off the floor for a scan. 

## 2015-06-14 ENCOUNTER — Observation Stay (HOSPITAL_COMMUNITY): Payer: Medicare Other

## 2015-06-14 ENCOUNTER — Observation Stay (HOSPITAL_BASED_OUTPATIENT_CLINIC_OR_DEPARTMENT_OTHER): Payer: Medicare Other

## 2015-06-14 DIAGNOSIS — I6320 Cerebral infarction due to unspecified occlusion or stenosis of unspecified precerebral arteries: Secondary | ICD-10-CM | POA: Diagnosis not present

## 2015-06-14 DIAGNOSIS — I2510 Atherosclerotic heart disease of native coronary artery without angina pectoris: Secondary | ICD-10-CM | POA: Diagnosis present

## 2015-06-14 DIAGNOSIS — K7460 Unspecified cirrhosis of liver: Secondary | ICD-10-CM | POA: Diagnosis present

## 2015-06-14 DIAGNOSIS — N390 Urinary tract infection, site not specified: Secondary | ICD-10-CM | POA: Diagnosis not present

## 2015-06-14 DIAGNOSIS — B954 Other streptococcus as the cause of diseases classified elsewhere: Secondary | ICD-10-CM | POA: Diagnosis present

## 2015-06-14 DIAGNOSIS — G459 Transient cerebral ischemic attack, unspecified: Secondary | ICD-10-CM

## 2015-06-14 DIAGNOSIS — R4182 Altered mental status, unspecified: Secondary | ICD-10-CM | POA: Diagnosis present

## 2015-06-14 DIAGNOSIS — J9601 Acute respiratory failure with hypoxia: Secondary | ICD-10-CM | POA: Diagnosis present

## 2015-06-14 DIAGNOSIS — Z8249 Family history of ischemic heart disease and other diseases of the circulatory system: Secondary | ICD-10-CM | POA: Diagnosis not present

## 2015-06-14 DIAGNOSIS — Z9842 Cataract extraction status, left eye: Secondary | ICD-10-CM | POA: Diagnosis not present

## 2015-06-14 DIAGNOSIS — E871 Hypo-osmolality and hyponatremia: Secondary | ICD-10-CM | POA: Diagnosis present

## 2015-06-14 DIAGNOSIS — G4089 Other seizures: Secondary | ICD-10-CM | POA: Diagnosis present

## 2015-06-14 DIAGNOSIS — Z515 Encounter for palliative care: Secondary | ICD-10-CM | POA: Diagnosis not present

## 2015-06-14 DIAGNOSIS — E7220 Disorder of urea cycle metabolism, unspecified: Secondary | ICD-10-CM | POA: Diagnosis not present

## 2015-06-14 DIAGNOSIS — D649 Anemia, unspecified: Secondary | ICD-10-CM | POA: Diagnosis not present

## 2015-06-14 DIAGNOSIS — R569 Unspecified convulsions: Secondary | ICD-10-CM | POA: Diagnosis not present

## 2015-06-14 DIAGNOSIS — Z951 Presence of aortocoronary bypass graft: Secondary | ICD-10-CM | POA: Diagnosis not present

## 2015-06-14 DIAGNOSIS — Z9841 Cataract extraction status, right eye: Secondary | ICD-10-CM | POA: Diagnosis not present

## 2015-06-14 DIAGNOSIS — E785 Hyperlipidemia, unspecified: Secondary | ICD-10-CM | POA: Diagnosis present

## 2015-06-14 DIAGNOSIS — D696 Thrombocytopenia, unspecified: Secondary | ICD-10-CM | POA: Diagnosis present

## 2015-06-14 DIAGNOSIS — G9340 Encephalopathy, unspecified: Secondary | ICD-10-CM | POA: Diagnosis not present

## 2015-06-14 DIAGNOSIS — H9190 Unspecified hearing loss, unspecified ear: Secondary | ICD-10-CM | POA: Diagnosis present

## 2015-06-14 DIAGNOSIS — Z66 Do not resuscitate: Secondary | ICD-10-CM | POA: Diagnosis present

## 2015-06-14 DIAGNOSIS — D638 Anemia in other chronic diseases classified elsewhere: Secondary | ICD-10-CM | POA: Diagnosis present

## 2015-06-14 DIAGNOSIS — Z888 Allergy status to other drugs, medicaments and biological substances status: Secondary | ICD-10-CM | POA: Diagnosis not present

## 2015-06-14 DIAGNOSIS — E872 Acidosis: Secondary | ICD-10-CM | POA: Diagnosis present

## 2015-06-14 DIAGNOSIS — Z8673 Personal history of transient ischemic attack (TIA), and cerebral infarction without residual deficits: Secondary | ICD-10-CM | POA: Diagnosis not present

## 2015-06-14 DIAGNOSIS — R739 Hyperglycemia, unspecified: Secondary | ICD-10-CM | POA: Diagnosis present

## 2015-06-14 DIAGNOSIS — I110 Hypertensive heart disease with heart failure: Secondary | ICD-10-CM | POA: Diagnosis present

## 2015-06-14 DIAGNOSIS — B9620 Unspecified Escherichia coli [E. coli] as the cause of diseases classified elsewhere: Secondary | ICD-10-CM | POA: Diagnosis present

## 2015-06-14 DIAGNOSIS — J9602 Acute respiratory failure with hypercapnia: Secondary | ICD-10-CM | POA: Diagnosis present

## 2015-06-14 DIAGNOSIS — I5043 Acute on chronic combined systolic (congestive) and diastolic (congestive) heart failure: Secondary | ICD-10-CM | POA: Diagnosis present

## 2015-06-14 DIAGNOSIS — M436 Torticollis: Secondary | ICD-10-CM | POA: Diagnosis present

## 2015-06-14 DIAGNOSIS — F1021 Alcohol dependence, in remission: Secondary | ICD-10-CM | POA: Diagnosis present

## 2015-06-14 DIAGNOSIS — R404 Transient alteration of awareness: Secondary | ICD-10-CM | POA: Diagnosis not present

## 2015-06-14 DIAGNOSIS — E860 Dehydration: Secondary | ICD-10-CM | POA: Diagnosis present

## 2015-06-14 DIAGNOSIS — E876 Hypokalemia: Secondary | ICD-10-CM | POA: Diagnosis present

## 2015-06-14 DIAGNOSIS — I10 Essential (primary) hypertension: Secondary | ICD-10-CM | POA: Diagnosis not present

## 2015-06-14 DIAGNOSIS — D509 Iron deficiency anemia, unspecified: Secondary | ICD-10-CM | POA: Diagnosis present

## 2015-06-14 DIAGNOSIS — R410 Disorientation, unspecified: Secondary | ICD-10-CM | POA: Diagnosis not present

## 2015-06-14 DIAGNOSIS — E7800 Pure hypercholesterolemia, unspecified: Secondary | ICD-10-CM | POA: Diagnosis present

## 2015-06-14 LAB — COMPREHENSIVE METABOLIC PANEL
ALK PHOS: 50 U/L (ref 38–126)
ALT: 24 U/L (ref 14–54)
AST: 63 U/L — AB (ref 15–41)
Albumin: 3.4 g/dL — ABNORMAL LOW (ref 3.5–5.0)
Anion gap: 12 (ref 5–15)
BILIRUBIN TOTAL: 1.2 mg/dL (ref 0.3–1.2)
BUN: 18 mg/dL (ref 6–20)
CALCIUM: 9.2 mg/dL (ref 8.9–10.3)
CO2: 24 mmol/L (ref 22–32)
Chloride: 101 mmol/L (ref 101–111)
Creatinine, Ser: 0.93 mg/dL (ref 0.44–1.00)
GFR calc Af Amer: >60 mL/min (ref >60)
GFR, EST NON AFRICAN AMERICAN: 53 mL/min — AB (ref >60)
GLUCOSE: 82 mg/dL (ref 65–99)
POTASSIUM: 3.1 mmol/L — AB (ref 3.5–5.1)
Sodium: 137 mmol/L (ref 135–145)
TOTAL PROTEIN: 6 g/dL — AB (ref 6.5–8.1)

## 2015-06-14 LAB — CBC
HEMATOCRIT: 36.3 % (ref 36.0–46.0)
Hemoglobin: 11.8 g/dL — ABNORMAL LOW (ref 12.0–15.0)
MCH: 26 pg (ref 26.0–34.0)
MCHC: 32.5 g/dL (ref 30.0–36.0)
MCV: 80.1 fL (ref 78.0–100.0)
PLATELETS: 89 10*3/uL — AB (ref 150–400)
RBC: 4.53 MIL/uL (ref 3.87–5.11)
RDW: 25.5 % — AB (ref 11.5–15.5)
WBC: 4.8 10*3/uL (ref 4.0–10.5)

## 2015-06-14 LAB — AMMONIA: Ammonia: 54 umol/L — ABNORMAL HIGH (ref 9–35)

## 2015-06-14 LAB — HEMOGLOBIN A1C
HEMOGLOBIN A1C: 5.5 % (ref 4.8–5.6)
Mean Plasma Glucose: 111 mg/dL

## 2015-06-14 LAB — LIPID PANEL
CHOL/HDL RATIO: 2.1 ratio
Cholesterol: 135 mg/dL (ref 0–200)
HDL: 65 mg/dL (ref >40)
LDL Cholesterol: 58 mg/dL (ref 0–99)
Triglycerides: 59 mg/dL (ref <150)
VLDL: 12 mg/dL (ref 0–40)

## 2015-06-14 LAB — TSH: TSH: 2.545 u[IU]/mL (ref 0.350–4.500)

## 2015-06-14 LAB — FOLATE: FOLATE: 26.4 ng/mL (ref >5.9)

## 2015-06-14 LAB — VITAMIN B12: VITAMIN B 12: 796 pg/mL (ref 180–914)

## 2015-06-14 LAB — ECHOCARDIOGRAM COMPLETE

## 2015-06-14 MED ORDER — CHLORHEXIDINE GLUCONATE 0.12 % MT SOLN
15.0000 mL | Freq: Two times a day (BID) | OROMUCOSAL | Status: DC
  Administered 2015-06-14 – 2015-06-17 (×7): 15 mL via OROMUCOSAL
  Filled 2015-06-14 (×4): qty 15

## 2015-06-14 MED ORDER — LORAZEPAM 2 MG/ML IJ SOLN
0.5000 mg | Freq: Once | INTRAMUSCULAR | Status: AC
  Administered 2015-06-14: 0.5 mg via INTRAVENOUS
  Filled 2015-06-14: qty 1

## 2015-06-14 MED ORDER — CETYLPYRIDINIUM CHLORIDE 0.05 % MT LIQD
7.0000 mL | Freq: Two times a day (BID) | OROMUCOSAL | Status: DC
  Administered 2015-06-14 – 2015-06-17 (×8): 7 mL via OROMUCOSAL

## 2015-06-14 MED ORDER — LACTULOSE ENEMA
300.0000 mL | Freq: Once | ORAL | Status: AC
  Administered 2015-06-14: 300 mL via RECTAL
  Filled 2015-06-14: qty 300

## 2015-06-14 NOTE — Progress Notes (Signed)
Interval History:                                                                                                                      Anne Hanna is an 82 y.o. female patient with  history of temporal hemorrhagic stroke, possible seizures, cirrhosis, HTN, CAD and thrombocytopenia who presented with AMS. She was recently hospitalized for acute respiratory failure. Family states she was confused at that time as well. Yesterday she was last seen to be normal at 5 PM. Her daughter subsequently found her unresponsive this AM, with concern for left facial droop. During transport by EMS, she was awake, but not following commands. She did have any fever, chest pain, cough, headache, neck stiffness or worsened SOB per daughter. Also no EtOH or drug abuse. CT head in the ED was unremarkable and urine was clear.   MRI brain was limited but showed no acute infarct. She is still under the effects of the Ativan and is groggy.    Past Medical History: Past Medical History  Diagnosis Date  . Coronary artery disease     STATUS POST CABG  . Hypertension   . Hypercholesterolemia   . CVA (cerebral vascular accident) (HCC)     OCULAR CVA  . Hx: UTI (urinary tract infection)   . Glaucoma     History of glaucoma  . Bronchopneumonia     Right lung  . Urinary tract infection     Escherichia coli urinary tract infection  . Acute sinusitis   . Thrombocytopenia (HCC) 2010     platelet count of 96 at discharge (95 on admission)  . Hyperlipidemia   . History of transient ischemic attack   . Hyponatremia     volume depletion - resolved  . Volume overload     History of postoperative volume overload  . Delirium     Postoperative delirium  . Cataract   . CVA (cerebral infarction)     temporal hemorhage  . Gallstones   . Hepatic cyst   . Cirrhosis (HCC)     per endo 2015    Past Surgical History  Procedure Laterality Date  . Cardiac catheterization  10/05/2006    NORMAL LEFT VENTRICULAR SIZE. THERE IS  MODERATE GLOBAL HYPOKINESIA WITH OVERALL EF 40%  . Coronary artery bypass graft  09/2006    LIMA GRAFT TO THE LAD, SAPHENOUS VEIN GRAFT TO THE FIRST OBTUSE MARGINAL VESSEL WITH SEQUENTIAL GRAFT TO THE SECOND MARGINAL VESSEL , AND SAPHENOUS VEIN GRAFT TO THE PDA  . Cataract surgery      Bilateral cataract extraction-- x2  . Tendon repair      Status post repair of tendon of the left index finger  . Esophagogastroduodenoscopy N/A 04/22/2013    Procedure: ESOPHAGOGASTRODUODENOSCOPY (EGD);  Surgeon: Willis ModenaWilliam Outlaw, MD;  [facility];  Service: Endoscopy;  Laterality: N/A;    Family History: Family History  Problem Relation Age of Onset  . Heart failure Mother   . Heart failure  Father   . Heart disease Brother     Social History:   reports that she has never smoked. She has never used smokeless tobacco. She reports that she does not drink alcohol or use illicit drugs.  Allergies:  Allergies  Allergen Reactions  . Dilantin [Phenytoin]   . Amlodipine Swelling  . Lipitor [Atorvastatin Calcium]     HEADACHES     Medications:                                                                                                                         Current facility-administered medications:  .  0.9 %  sodium chloride infusion, , Intravenous, Continuous, Sung Amabile Wertman, PA-C .  acetaminophen (TYLENOL) suppository 650 mg, 650 mg, Rectal, Q4H PRN, Roma Kayser Schorr, NP .  bisacodyl (DULCOLAX) suppository 10 mg, 10 mg, Rectal, Daily PRN, Marcos Eke, PA-C .  cefTRIAXone (ROCEPHIN) 1 g in dextrose 5 % 50 mL IVPB, 1 g, Intravenous, Q24H, Alberteen Sam, MD, 1 g at 06-21-2015 2247 .  dextrose 5 %-0.9 % sodium chloride infusion, , Intravenous, Continuous, Caryl Pina, MD .  morphine 2 MG/ML injection 1 mg, 1 mg, Intravenous, Q4H PRN, Marcos Eke, PA-C .  ondansetron (ZOFRAN) tablet 4 mg, 4 mg, Oral, Q6H PRN **OR** ondansetron (ZOFRAN) injection 4 mg, 4 mg, Intravenous, Q6H PRN,  Marcos Eke, PA-C .  sodium chloride flush (NS) 0.9 % injection 3 mL, 3 mL, Intravenous, Q12H, Marcos Eke, PA-C, 3 mL at 06/14/15 1045   Neurologic Examination:                                                                                                     Today's Vitals   06/14/15 0249 06/14/15 0530 06/14/15 0922 06/14/15 1040  BP: 159/66 164/66 158/64   Pulse: 82 77 81   Temp: 98.3 F (36.8 C) 98.8 F (37.1 C) 97.6 F (36.4 C)   TempSrc: Oral Oral Oral   Resp: SpO2: 99% 99% 98%   PainSc:    6     Evaluation of higher integrative functions including: Level of alertness: Alert but drifts to sleep if not stimulated.  Oriented to daughter in room Speech:dysarthric and hard to understand--no clear aphasia noted.   Test the following cranial nerves: PERRLA, EOMI, TML, right facial droop.  Motor examination:moving all extremities antigravity equally Examination of sensation : Normal and symmetric sensation to pinprick in all 4 extremities and on face Examination of deep tendon reflexes: 2+, normal and symmetric in  all extremities, no babinski  bilaterally   Lab Results: Basic Metabolic Panel:  Recent Labs Lab 05/28/2015 1047 06/10/2015 1049 06/14/15 0637  NA 134* 135 137  K 3.1* 3.1* 3.1*  CL 97* 99* 101  CO2  --  21* 24  GLUCOSE 206* 211* 82  BUN 21* 19 18  CREATININE 0.90 1.19* 0.93  CALCIUM  --  9.6 9.2    Liver Function Tests:  Recent Labs Lab 06/09/2015 1049 06/14/15 0637  AST 46* 63*  ALT 22 24  ALKPHOS 64 50  BILITOT 0.7 1.2  PROT 6.8 6.0*  ALBUMIN 3.9 3.4*   No results for input(s): LIPASE, AMYLASE in the last 168 hours.  Recent Labs Lab 05/19/2015 1555  AMMONIA 47*    CBC:  Recent Labs Lab 06/17/2015 1047 06/15/2015 1049 06/14/15 0637  WBC  --  3.8* 4.8  NEUTROABS  --  3.2  --   HGB 13.9 11.9* 11.8*  HCT 41.0 37.1 36.3  MCV  --  81.0 80.1  PLT  --  93* 89*    Cardiac Enzymes: No results for input(s): CKTOTAL, CKMB,  CKMBINDEX, TROPONINI in the last 168 hours.  Lipid Panel:  Recent Labs Lab 06/14/15 0828  CHOL 135  TRIG 59  HDL 65  CHOLHDL 2.1  VLDL 12  LDLCALC 58    CBG: No results for input(s): GLUCAP in the last 168 hours.  Microbiology: Results for orders placed or performed during the hospital encounter of 03/04/12  Urine culture     Status: None   Collection Time: 03/04/12  7:09 PM  Result Value Ref Range Status   Specimen Description URINE, RANDOM  Final   Special Requests NONE  Final   Culture  Setup Time 03/04/2012 20:19  Final   Colony Count >=100,000 COLONIES/ML  Final   Culture STREPTOCOCCUS GROUP D;high probability for S.bovis  Final   Report Status 03/07/2012 FINAL  Final   Organism ID, Bacteria STREPTOCOCCUS GROUP D;high probability for S.bovis  Final      Susceptibility   Streptococcus group d;high probability for s.bovis - MIC (ETEST)*    PENICILLIN <=0.12 Sensitive     * STREPTOCOCCUS GROUP D;high probability for S.bovis  MRSA PCR Screening     Status: None   Collection Time: 03/04/12 11:46 PM  Result Value Ref Range Status   MRSA by PCR NEGATIVE NEGATIVE Final    Comment:        The GeneXpert MRSA Assay (FDA approved for NASAL specimens only), is one component of a comprehensive MRSA colonization surveillance program. It is not intended to diagnose MRSA infection nor to guide or monitor treatment for MRSA infections.    Imaging: Ct Head Wo Contrast  06/01/2015  CLINICAL DATA:  Altered mental status. Unequal pupils. Left-sided facial droop. EXAM: CT HEAD WITHOUT CONTRAST TECHNIQUE: Contiguous axial images were obtained from the base of the skull through the vertex without intravenous contrast. COMPARISON:  03/04/2012; brain MRI- 03/05/2012 FINDINGS: Examination is degraded due to patient motion artifact and patient's inability to cooperate with instructions. Re- demonstrated advanced atrophy with sulcal prominence centralized volume loss with commensurate ex  vacuo dilatation of the ventricular system. Extensive periventricular hypodensities compatible with microvascular ischemic disease. Given background parenchymal abnormalities and patient motion, there is no definitive since CT evidence of acute superimposed large territory infarct. No intraparenchymal or extra-axial mass or hemorrhage. Unchanged size and configuration of the ventricles and basilar cisterns. Mild (approximately 6 mm) of left-to-right midline shift is unchanged since the  02/2012 examination and likely attributable to asymmetric atrophy. Intracranial atherosclerosis. Limited visualization of the paranasal sinuses and mastoid air cells is normal. No air-fluid levels. Regional soft tissues appear normal. Post bilateral cataract surgery. No definitive displaced calvarial fracture. IMPRESSION: Similar findings of advanced atrophy and microvascular ischemic disease without definitive superimposed acute large territory infarct on this motion degraded examination. Electronically Signed   By: Simonne Come M.D.   On: 05/31/2015 12:51   Mr Brain Ltd W/o Cm  06/14/2015  CLINICAL DATA:  Altered mental status, unresponsive this morning. LEFT facial droop. History of stroke, seizures, cirrhosis, hypertension and thrombocytopenia. EXAM: MRI HEAD WITHOUT CONTRAST TECHNIQUE: Axial and coronal diffusion weighted imaging of the brain and surrounding structures were obtained without intravenous contrast. COMPARISON:  CT head June 13, 2015 and MRI of the brain March 05, 2012 FINDINGS: Per technologist note was unable to tolerate further imaging, on sedation. No reduced diffusion to suggest acute ischemia. Trace likely chronic LEFT holohemispheric subdural hematoma though new from prior MRI. Moderate to severe ventriculomegaly on the basis of global parenchymal brain volume loss. IMPRESSION: Limited diffusion weighted imaging MRI of the brain: No acute ischemia. Trace, likely chronic LEFT subdural hematoma.  Electronically Signed   By: Awilda Metro M.D.   On: 06/14/2015 05:34    Assessment and plan:   ELIYANA PAGLIARO is an 82 y.o. female patient with AMS and possible seizure activity. Currently sedated from Ativan.  Given history of seizure and CVA cannot rule out Seizure with post ictal state. No clear infectious etiology. UC pending. Will obtain a TSH and Ammonia.   To be seen by Dr. Lavon Paganini. Please see his attestation note for A/P for any additional work up recommendations.

## 2015-06-14 NOTE — Progress Notes (Addendum)
[facility] PROGRESS NOTE  MOLLEE NEER ZOX:[id number] DOB: 1923/01/23 DOA: 06/08/2015 PCP: [name], MD  Brief narrative 82 year old female with history of hemorrhagic stroke in 2013, CAD, hypertension, chronic cytopenia, cirrhosis of liver who was hospitalized 2 weeks back with acute respiratory failure and severe anemia with iron deficiency requiring transfusion,? History of seizures presented with acute encephalopathy. Daughter found patient to be unresponsive in the morning with concern for left facial droop. Then EMS arrived patient was awake but not following commands. No recent fevers, complaint of chest pain, shortness of breath, abdominal pain, bowel or urinary symptoms. Patient lives independently and is capable of her ADLs. No history of dementia. Vitals in the ED were stable. Blood work unremarkable except for chronic low platelets, chemistry showed potassium 3.1 and creatinine of 1.19, glucose of 211. Normal LFTs. UA suggestive of UTI. Patient admitted to hospitalist service. Neurology consulted.  Assessment/Plan: Acute encephalopathy Possibly metabolic (UTI, hepatic encephalopathy), versus postictal. MRI brain negative for acute ischemia. Shows chronic left subdural hematoma. Mentation appears to be improving today patient is very hard of hearing. On empiric Rocephin for UTI. Follow culture. Patient was febrile temperatures of 101F this morning. Check chest x-ray. -Ammonia level elevated. Will order lactulose. Check EEG. B12, TSH normal. -Neurology consult appreciated.  Liver cirrhosis with chronic Thrombocytopenia  history of alcohol use. LFTs normal. Add lactulose given elevated ammonia.  Anemia of chronic chronic disease, iron deficiency  Recently hospitalized with hemoglobin of 5.5 requiring 4 unit PRBC. Currently stable. Continue iron supplement.  Hyperlipidemia  continue statin  Hypokalemia  replenished  DVT prophylaxis: SCDs Diet: Nothing by  mouth  Code Status: DNR Family Communication: Daughter at bedside Disposition Plan: Pending clinical improvement, PT evaluation   Consultants:  Neurology  Procedures:  MRI brain  EEG  Antibiotics:  IV rocephin  HPI/Subjective: Seen and examined . Appears more awake and oriented this afternoon. Reportedly was very lethargic during the morning.  Objective: Filed Vitals:   06/14/15 0922 06/14/15 1319  BP: 158/64 157/61  Pulse: 81 71  Temp: 97.6 F (36.4 C) 98.6 F (37 C)  Resp: 20 20   No intake or output data in the 24 hours ending 06/14/15 1436 There were no vitals filed for this visit.  Exam:   General:  Elderly female not in distress  HEENT: No pallor, moist mucosa  Chest: Clear bilaterally  CVS: Normal S1 and S2, no murmurs rub or gallop  GI: Soft, nondistended, nontender, bowel sounds present  Musculoskeletal: Warm, no edema  CNS: Hard of hearing, difficult to communicate but patient is oriented to place and person.    Data Reviewed: Basic Metabolic Panel:  Recent Labs Lab 05/25/2015 1047 05/20/2015 1049 06/14/15 0637  NA 134* 135 137  K 3.1* 3.1* 3.1*  CL 97* 99* 101  CO2  --  21* 24  GLUCOSE 206* 211* 82  BUN 21* 19 18  CREATININE 0.90 1.19* 0.93  CALCIUM  --  9.6 9.2   Liver Function Tests:  Recent Labs Lab 06/10/2015 1049 06/14/15 0637  AST 46* 63*  ALT 22 24  ALKPHOS 64 50  BILITOT 0.7 1.2  PROT 6.8 6.0*  ALBUMIN 3.9 3.4*   No results for input(s): LIPASE, AMYLASE in the last 168 hours.  Recent Labs Lab 06/11/2015 1555 06/14/15 1157  AMMONIA 47* 54*   CBC:  Recent Labs Lab 06/14/2015 1047 05/31/2015 1049 06/14/15 0637  WBC  --  3.8* 4.8  NEUTROABS  --  3.2  --  HGB 13.9 11.9* 11.8*  HCT 41.0 37.1 36.3  MCV  --  81.0 80.1  PLT  --  93* 89*   Cardiac Enzymes: No results for input(s): CKTOTAL, CKMB, CKMBINDEX, TROPONINI in the last 168 hours. BNP (last 3 results)  Recent Labs  05/26/15 2211  BNP 1485.7*     ProBNP (last 3 results) No results for input(s): PROBNP in the last 8760 hours.  CBG: No results for input(s): GLUCAP in the last 168 hours.  Recent Results (from the past 240 hour(s))  Culture, Urine     Status: None (Preliminary result)   Collection Time: 05/21/2015 11:30 AM  Result Value Ref Range Status   Specimen Description URINE, RANDOM  Final   Special Requests ADDED 2120  Final   Culture TOO YOUNG TO READ  Final   Report Status PENDING  Incomplete     Studies: Ct Head Wo Contrast  05/19/2015  CLINICAL DATA:  Altered mental status. Unequal pupils. Left-sided facial droop. EXAM: CT HEAD WITHOUT CONTRAST TECHNIQUE: Contiguous axial images were obtained from the base of the skull through the vertex without intravenous contrast. COMPARISON:  03/04/2012; brain MRI- 03/05/2012 FINDINGS: Examination is degraded due to patient motion artifact and patient's inability to cooperate with instructions. Re- demonstrated advanced atrophy with sulcal prominence centralized volume loss with commensurate ex vacuo dilatation of the ventricular system. Extensive periventricular hypodensities compatible with microvascular ischemic disease. Given background parenchymal abnormalities and patient motion, there is no definitive since CT evidence of acute superimposed large territory infarct. No intraparenchymal or extra-axial mass or hemorrhage. Unchanged size and configuration of the ventricles and basilar cisterns. Mild (approximately 6 mm) of left-to-right midline shift is unchanged since the 02/2012 examination and likely attributable to asymmetric atrophy. Intracranial atherosclerosis. Limited visualization of the paranasal sinuses and mastoid air cells is normal. No air-fluid levels. Regional soft tissues appear normal. Post bilateral cataract surgery. No definitive displaced calvarial fracture. IMPRESSION: Similar findings of advanced atrophy and microvascular ischemic disease without definitive  superimposed acute large territory infarct on this motion degraded examination. Electronically Signed   By: Simonne ComeJohn  Watts M.D.   On: 06/12/2015 12:51   Mr Brain Ltd W/o Cm  06/14/2015  CLINICAL DATA:  Altered mental status, unresponsive this morning. LEFT facial droop. History of stroke, seizures, cirrhosis, hypertension and thrombocytopenia. EXAM: MRI HEAD WITHOUT CONTRAST TECHNIQUE: Axial and coronal diffusion weighted imaging of the brain and surrounding structures were obtained without intravenous contrast. COMPARISON:  CT head June 13, 2015 and MRI of the brain March 05, 2012 FINDINGS: Per technologist note was unable to tolerate further imaging, on sedation. No reduced diffusion to suggest acute ischemia. Trace likely chronic LEFT holohemispheric subdural hematoma though new from prior MRI. Moderate to severe ventriculomegaly on the basis of global parenchymal brain volume loss. IMPRESSION: Limited diffusion weighted imaging MRI of the brain: No acute ischemia. Trace, likely chronic LEFT subdural hematoma. Electronically Signed   By: Awilda Metroourtnay  Bloomer M.D.   On: 06/14/2015 05:34    Scheduled Meds: . antiseptic oral rinse  7 mL Mouth Rinse q12n4p  . cefTRIAXone (ROCEPHIN)  IV  1 g Intravenous Q24H  . chlorhexidine  15 mL Mouth Rinse BID  . sodium chloride flush  3 mL Intravenous Q12H   Continuous Infusions: . sodium chloride    . dextrose 5 % and 0.9% NaCl         Time spent: 25 minutes    Allexus Ovens  [facility] Pager [phone]. If 7PM-7AM, please contact night-coverage  at [email or web address], password [facility] 06/14/2015, 2:36 PM

## 2015-06-14 NOTE — Care Management Obs Status (Signed)
MEDICARE OBSERVATION STATUS NOTIFICATION   Patient Details  Name: Anne Hanna MRN: [id number] Date of Birth: 01/16/1923   Medicare Observation Status Notification Given:  Yes (MRI results negative)    Kermit BaloKelli F Erlene Devita, RN 06/14/2015, 11:51 AM

## 2015-06-14 NOTE — Evaluation (Signed)
Occupational Therapy Evaluation Patient Details Name: Anne BeckmannMary F Hanna MRN: [id number] DOB: 03/07/1923 Today's Date: 06/14/2015    History of Present Illness Anne BeckmannMary F Hanna is an 82 y.o. female with a history of temporal hemorrhagic stroke, possible seizures, cirrhosis, HTN, CAD and thrombocytopenia who presented with AMS. She was recently hospitalized for acute respiratory failure. Family states she was confused at that time as well. Yesterday she was last seen to be normal at 5 PM. Her daughter subsequently found her unresponsive this AM, with concern for left facial droop.   Clinical Impression   Patient presenting with decreased ADL and functional mobility independence secondary to above. Patient mod I with RW and living alone PTA. Patient currently functioning at an overall mod to max assist level, suspect this is due to lethargy/grogginess and cognition. Patient will benefit from acute OT to increase overall independence in the areas of ADLs, functional mobility, and overall safety in order to safely discharge to venue listed below VS home depending on progress in the hospital.     Follow Up Recommendations  SNF;Supervision/Assistance - 24 hour    Equipment Recommendations  Other (comment) (TBD)    Recommendations for Other Services  None at this time   Precautions / Restrictions Precautions Precautions: Fall Restrictions Weight Bearing Restrictions: No    Mobility Bed Mobility General bed mobility comments: Pt found seated in recliner upon OT entering/exiting room   Transfers Overall transfer level: Needs assistance Equipment used: None Transfers: Sit to/from [employer] CorporationStand;Stand Pivot Transfers Sit to Stand: Mod assist Stand pivot transfers: Max assist       General transfer comment: Pt with difficulty following commands secondary to lethargy and cognition     Balance Overall balance assessment: Needs assistance Sitting-balance support: No upper extremity supported;Feet  supported Sitting balance-Leahy Scale: Poor   Postural control: Posterior lean;Left lateral lean Standing balance support: No upper extremity supported Standing balance-Leahy Scale: Poor    ADL Overall ADL's : Needs assistance/impaired Eating/Feeding: Minimal assistance;Sitting Eating/Feeding Details (indicate cue type and reason): secondary to lethargy  Grooming: Minimal assistance;Sitting Grooming Details (indicate cue type and reason): secondary to lethargy  Upper Body Bathing: Maximal assistance;Sitting   Lower Body Bathing: Maximal assistance;Sit to/from stand   Upper Body Dressing : Maximal assistance;Sitting   Lower Body Dressing: Maximal assistance;Sit to/from stand Lower Body Dressing Details (indicate cue type and reason): Pt unable to follow simple one step command to take sock/s off  Toilet Transfer: Stand-pivot;BSC;Maximal assistance   Toileting- Clothing Manipulation and Hygiene: Total assistance;Sitting/lateral lean Toileting - Clothing Manipulation Details (indicate cue type and reason): Pt unable to follow command to perform peri cleansing    Tub/Shower Transfer Details (indicate cue type and reason): did not occur, safety concern  Functional mobility during ADLs: Maximal assistance General ADL Comments: Pt limited due to lethargy and decreased cognition    Vision Additional Comments: Unable to fully asses secondary to lethargy and cognition          Pertinent Vitals/Pain Pain Assessment: No/denies pain Pain Score: 0-No pain Faces Pain Scale: No hurt     Hand Dominance Right   Extremity/Trunk Assessment Upper Extremity Assessment Upper Extremity Assessment: Generalized weakness   Lower Extremity Assessment Lower Extremity Assessment: Generalized weakness   Cervical / Trunk Assessment Cervical / Trunk Assessment: Kyphotic   Communication Communication Communication: Expressive difficulties;HOH   Cognition Arousal/Alertness: Lethargic;Suspect due  to medications Behavior During Therapy: Flat affect Overall Cognitive Status: Impaired/Different from baseline Area of Impairment: Orientation;Attention;Memory;Awareness;Problem solving;Following commands Orientation Level: Disoriented to;Situation;Time  Current Attention Level: Focused Memory: Decreased short-term memory Following Commands: Follows one step commands inconsistently   Awareness: Intellectual Problem Solving: Slow processing;Decreased initiation;Difficulty sequencing;Requires verbal cues;Requires tactile cues General Comments: Pt received atival for MRI last night and has been very lethargic since. Pt awake and alert entire OT eval, she did not fall asleep. Pt with no hearing aides in and when daughter attempting to assist with putting in her dentures, pt kept thrusting them out and did not hold onto the dentures.               Home Living Family/patient expects to be discharged to:: Private residence Living Arrangements: Alone Available Help at Discharge: Family;Available 24 hours/day Type of Home: House Home Access: Stairs to enter [employer] of Steps: 3 Entrance Stairs-Rails: None Home Layout: One level     Bathroom Shower/Tub: Tub/shower unit;Door   [employer] Toilet: Standard     Home Equipment: Environmental consultant - 4 wheels;Cane - single point   Prior Functioning/Environment Level of Independence: Independent with assistive device(s)  Comments: Amb with cane    OT Diagnosis: Generalized weakness;Altered mental status   OT Problem List: Decreased strength;Decreased activity tolerance;Impaired balance (sitting and/or standing);Decreased coordination;Decreased cognition;Decreased safety awareness;Decreased knowledge of use of DME or AE;Decreased knowledge of precautions   OT Treatment/Interventions: Self-care/ADL training;Therapeutic exercise;Energy conservation;DME and/or AE instruction;Therapeutic activities;Patient/family education;Balance training    OT  Goals(Current goals can be found in the care plan section) Acute Rehab OT Goals Patient Stated Goal: none stated  OT Goal Formulation: Patient unable to participate in goal setting (with daughter) Time For Goal Achievement: 06/28/15 Potential to Achieve Goals: Good ADL Goals Pt Will Perform Grooming: with min assist;standing Pt Will Perform Lower Body Bathing: with min assist;sit to/from stand Pt Will Perform Lower Body Dressing: with min assist;sit to/from stand Pt Will Transfer to Toilet: with min assist;ambulating;bedside commode Additional ADL Goal #1: Pt will follow one step commands consistently independently  Additional ADL Goal #2: Pt will perform sit to/from stands with supervision using RW  OT Frequency: Min 3X/week   Barriers to D/C: Decreased caregiver support   End of Session Equipment Utilized During Treatment: Gait belt Nurse Communication: Other (comment);Mobility status (per RN doffed bilateral hand restraints/mitts)  Activity Tolerance: Patient limited by lethargy Patient left: in chair;with call bell/phone within reach;with chair alarm set;with family/visitor present   Time: 1610-9604 OT Time Calculation (min): 22 min Charges:  OT General Charges $OT Visit: 1 Procedure OT Evaluation $OT Eval Moderate Complexity: 1 Procedure G-Codes: OT G-codes **NOT FOR INPATIENT CLASS** Functional Limitation: Self care Self Care Current Status (V4098): At least 60 percent but less than 80 percent impaired, limited or restricted Self Care Goal Status (J1914): At least 1 percent but less than 20 percent impaired, limited or restricted  Edwin Cap , MS, OTR/L, CLT Pager: [phone]  06/14/2015, 12:55 PM

## 2015-06-14 NOTE — Progress Notes (Signed)
  Echocardiogram 2D Echocardiogram has been performed.  Anne SavoyCasey N Shelah Hanna 06/14/2015, 9:29 AM

## 2015-06-14 NOTE — Progress Notes (Signed)
Pt pulled out 2 IVs while wearing mittens.  Pt continues to try and get out of bed and throws legs over the side of the bed rail. Pt confused and agitated stating "I have to pee" even though she is incontinent and has already voided.  Pt assisted onto bedpan and did not void.  MD notified and ordered ativan (see MAR) and soft wrist restraints.  New IV placed so pt can receive fluids and antibiotics.  Will try to get sitter if one is available.  Will continue to closely monitor.   Estanislado EmmsAshley Schwarz, RN

## 2015-06-14 NOTE — Progress Notes (Signed)
VASCULAR LAB PRELIMINARY  PRELIMINARY  PRELIMINARY  PRELIMINARY  Carotid duplex completed.    Preliminary report:  1-39% ICA plaquing.  Vertebral artery flow is antegrade.   Arena Lindahl, RVT 06/14/2015, 9:06 AM

## 2015-06-14 NOTE — Progress Notes (Signed)
Initial Nutrition Assessment   INTERVENTION:  Diet advancement per MD/SLP  Provide Ensure Enlive po BID when diet is advanced until PO intake is determined adequate, each supplement provides 350 kcal and 20 grams of protein    NUTRITION DIAGNOSIS:   Predicted suboptimal nutrient intake related to lethargy/confusion as evidenced by NPO status.   GOAL:   Patient will meet greater than or equal to 90% of their needs   MONITOR:   Diet advancement, PO intake, Supplement acceptance, Weight trends, Labs, Skin  REASON FOR ASSESSMENT:   Malnutrition Screening Tool    ASSESSMENT:   82 y.o. female patient with history of temporal hemorrhagic stroke, possible seizures, cirrhosis, HTN, CAD and thrombocytopenia who presented with AMS. She was recently hospitalized for acute respiratory failure.   Pt awake and alert at time of visit, but seemingly confused. Per pt's family at bedside, pt was eating well PTA since previous discharge. Pt used to weigh 138 lbs, lost down to 120 lbs and has since regained to 130 lbs per pt's family. Pt has not eaten since Saturday and states that she is not hungry. Per family, pt's MD recently recommended Ensure- pt has a case at home, but has not tried it yet.   Labs: low potassium  Diet Order:  Diet NPO time specified  Skin:  Reviewed, no issues  Last BM:  PTA  Height:   Ht Readings from Last 1 Encounters:  05/27/15 5\' 5"  (1.651 m)    Weight:   Wt Readings from Last 1 Encounters:  05/27/15 127 lb 8.4 oz (57.844 kg)    Ideal Body Weight:  56.8 kg  BMI:  There is no weight on file to calculate BMI.  Estimated Nutritional Needs:   Kcal:  1300-1500  Protein:  68-80 grams  Fluid:  1.3-1.5 L/day  EDUCATION NEEDS:   No education needs identified at this time  Dorothea Ogleeanne Raoul Ciano RD, LDN Inpatient Clinical Dietitian Pager: [phone] After Hours Pager: [phone]

## 2015-06-14 NOTE — Progress Notes (Signed)
PT evaluation    06/14/15 1221  PT Visit Information  Last PT Received On 06/14/15  Assistance Needed +1  History of Present Illness Anne Hanna is an 82 y.o. female with a history of temporal hemorrhagic stroke, possible seizures, cirrhosis, HTN, CAD and thrombocytopenia who presented with AMS. She was recently hospitalized for acute respiratory failure. Family states she was confused at that time as well. Yesterday she was last seen to be normal at 5 PM. Her daughter subsequently found her unresponsive this AM, with concern for left facial droop.  Precautions  Precautions Fall  Restrictions  Weight Bearing Restrictions No  Home Living  Family/patient expects to be discharged to: Private residence  Living Arrangements Alone  Available Help at Discharge Family;Available 24 hours/day  Type of Home House  Home Access Stairs to enter  Entrance Stairs-Number of Steps 3  Entrance Stairs-Rails None  Home Layout One level  Bathroom Shower/Tub Walk-in IT trainershower  Bathroom Toilet Standard  Home Equipment Walker - 4 wheels;Cane - single point;Shower seat  Prior Function  Level of Independence Independent with assistive device(s)  Comments Amb with cane  Communication  Communication Expressive difficulties;HOH  Pain Assessment  Pain Assessment Faces  Pain Score 0  Cognition  Arousal/Alertness Lethargic;Suspect due to medications  Behavior During Therapy Flat affect  Overall Cognitive Status Impaired/Different from baseline  Area of Impairment Orientation;Attention;Memory;Awareness;Problem solving;Following commands  Orientation Level Disoriented to;Situation;Time  Current Attention Level Focused  Memory Decreased short-term memory  Following Commands Follows one step commands inconsistently  Awareness Intellectual  Problem Solving Slow processing;Decreased initiation;Difficulty sequencing;Requires verbal cues;Requires tactile cues  General  Comments pt received ATivan for MRI last night and has been very lethargic since, arouses to her name and will remain awake several seconds but then falls back asleep, even in sitting EOB. Also, pt wears dentures and hearing aides and neither were in on eval since she had been sleeping.   Upper Extremity Assessment  Upper Extremity Assessment Generalized weakness;Defer to OT evaluation  Lower Extremity Assessment  Lower Extremity Assessment Difficult to assess due to impaired cognition;LLE deficits/detail  LLE Deficits / Details pt very lethargic so difficult to assess but pt actively moved RLE on command but not left and daughter reports that left side was weaker when she found her mother (old CVA affected left side)  Cervical / Trunk Assessment  Cervical / Trunk Assessment Kyphotic  Bed Mobility  Overal bed mobility Needs Assistance  Bed Mobility Supine to Sit  Supine to sit Mod assist  General bed mobility comments pt very sleepy so bed mobility facilitated to help arouse her, once beginning to move, pt did assist with transfer to EOB, mod A given for elevation of trunk. Pt with urine on gown, fresh gown donned before getting out of bed.   Transfers  Overall transfer level Needs assistance  Equipment used 2 person hand held assist  Transfers Sit to/from BJ'sStand;Stand Pivot Transfers  Sit to Stand +2 physical assistance;Min assist  Stand pivot transfers Min assist;+2 physical assistance  General transfer comment once stand was initiated at hips, pt stood with min A +2 for support and took small pivot steps to chair with min A +2  Ambulation/Gait  General Gait Details NT due to pt lethargy  Balance  Overall balance assessment Needs assistance  Sitting-balance support Bilateral upper extremity supported  Sitting balance-Leahy Scale Poor  Sitting balance - Comments pt maintained sitting EOB x10 mins with mod A at most and min A at least, slight  left lean, inability to maintain sitting without  support  Postural control Left lateral lean  Standing balance support Bilateral upper extremity supported  Standing balance-Leahy Scale Poor  Standing balance comment required min A +2 to maintain standing  General Comments  General comments (skin integrity, edema, etc.) pt's family would prefer for her to go home and report that she could have 3-4 hrs/ day caregiving but not 24 hr, if that is needed, they are agreeable to SNF. Effects of Ativan significantly affecting eval today so next visit may be large improvement in function  PT - End of Session  Equipment Utilized During Treatment Gait belt  Activity Tolerance Patient tolerated treatment well  Patient left in chair;with call bell/phone within reach;with chair alarm set (mitts applied)  Nurse Communication Mobility status  PT Assessment  PT Therapy Diagnosis  Difficulty walking;Generalized weakness  PT Recommendation/Assessment Patient needs continued PT services  PT Problem List Decreased strength;Decreased activity tolerance;Decreased balance;Decreased mobility;Decreased coordination;Decreased cognition;Decreased knowledge of precautions;Decreased safety awareness  Barriers to Discharge Decreased caregiver support  PT Plan  PT Frequency (ACUTE ONLY) Min 3X/week  PT Treatment/Interventions (ACUTE ONLY) DME instruction;Gait training;Functional mobility training;Stair training;Therapeutic activities;Therapeutic exercise;Balance training;Neuromuscular re-education;Cognitive remediation;Patient/family education  PT Recommendation  Follow Up Recommendations SNF;Supervision/Assistance - 24 hour  PT equipment None recommended by PT  Individuals Consulted  Consulted and Agree with Results and Recommendations Patient;Family member/caregiver  Family Member Consulted daughter  Acute Rehab PT Goals  Patient Stated Goal Return home  PT Goal Formulation With patient  Time For Goal Achievement 06/28/15  Potential to Achieve Goals Good  PT Time  Calculation  PT Start Time (ACUTE ONLY) 1053  PT Stop Time (ACUTE ONLY) 1118  PT Time Calculation (min) (ACUTE ONLY) 25 min  PT G-Codes **NOT FOR INPATIENT CLASS**  Functional Assessment Tool Used clinical judgement  Functional Limitation Mobility: Walking and moving around  Mobility: Walking and Moving Around Current Status (Z6109) CK  Mobility: Walking and Moving Around Goal Status (U0454) CI  PT General Charges  $$ ACUTE PT VISIT 1 Procedure  PT Evaluation  $PT Eval Moderate Complexity 1 Procedure  PT Treatments  $Therapeutic Activity 8-22 mins  Written Expression  Dominant Hand Right  Assessment: Pt admitted with above diagnosis. Pt currently with functional limitations due to the deficits listed below (see PT Problem List). Pt transferred bed to chair with +2 min A. Lethargy significantly affected eval today, hopefully next session will be a better indication of rehab potential. Pt will benefit from skilled PT to increase their independence and safety with mobility. Currently recommending SNF due to current level of impairment, will update as appropriate. Family hopeful that pt can go home with HHPT.  Lyanne Co, PT  Acute Rehab Services  [phone]

## 2015-06-14 NOTE — Evaluation (Signed)
Clinical/Bedside Swallow Evaluation Patient Details  Name: Anne Hanna MRN: [id number] Date of Birth: December 03, 1922  Today's Date: 06/14/2015 Time: SLP Start Time (ACUTE ONLY): 1015 SLP Stop Time (ACUTE ONLY): 1028 SLP Time Calculation (min) (ACUTE ONLY): 13 min  Past Medical History:  Past Medical History  Diagnosis Date  . Coronary artery disease     STATUS POST CABG  . Hypertension   . Hypercholesterolemia   . CVA (cerebral vascular accident) (HCC)     OCULAR CVA  . Hx: UTI (urinary tract infection)   . Glaucoma     History of glaucoma  . Bronchopneumonia     Right lung  . Urinary tract infection     Escherichia coli urinary tract infection  . Acute sinusitis   . Thrombocytopenia (HCC) 2010     platelet count of 96 at discharge (95 on admission)  . Hyperlipidemia   . History of transient ischemic attack   . Hyponatremia     volume depletion - resolved  . Volume overload     History of postoperative volume overload  . Delirium     Postoperative delirium  . Cataract   . CVA (cerebral infarction)     temporal hemorhage  . Gallstones   . Hepatic cyst   . Cirrhosis (HCC)     per endo 2015   Past Surgical History:  Past Surgical History  Procedure Laterality Date  . Cardiac catheterization  10/05/2006    NORMAL LEFT VENTRICULAR SIZE. THERE IS MODERATE GLOBAL HYPOKINESIA WITH OVERALL EF 40%  . Coronary artery bypass graft  09/2006    LIMA GRAFT TO THE LAD, SAPHENOUS VEIN GRAFT TO THE FIRST OBTUSE MARGINAL VESSEL WITH SEQUENTIAL GRAFT TO THE SECOND MARGINAL VESSEL , AND SAPHENOUS VEIN GRAFT TO THE PDA  . Cataract surgery      Bilateral cataract extraction-- x2  . Tendon repair      Status post repair of tendon of the left index finger  . Esophagogastroduodenoscopy N/A 04/22/2013    Procedure: ESOPHAGOGASTRODUODENOSCOPY (EGD);  Surgeon: Willis Modena, MD;  Location: [facility] ENDOSCOPY;  Service: Endoscopy;  Laterality: N/A;   HPI:  82 y.o. female with a history of temporal  hemorrhagic stroke, possible seizures, cirrhosis, HTN, CAD and thrombocytopenia who presented with AMS and right facial droop. MRI negative for acute infarct. Pt with previous swallowing evaluations including MBS in 2013 recommending Dys 3 diet and thin liquids. At the time pt did have penetration of thin liquids, but reflexive coughing was effective at clearing. Daughter reports no coughing with intake PTA.   Assessment / Plan / Recommendation Clinical Impression  Pt is lethargic but arousable for PO trials. She does not follow commands consistently, although daughter says that this is "normal" for her, and that she sometimes doesn't "prcoess" things. Hearing aids also not in place currently. Swallow response is triggered consistently, although appears sluggish. Poor oral awareness particularly with liquids leads to coughing. Tolerance appears improved with purees, although she is only able to take a few bites before nodding off. Per RN, this level of lethargy may be medication related. Recommend to remain NPO except for meds crushed in puree when alert. Hopeful for good ability to start diet when more alert, but will f/u for readiness.    Aspiration Risk  Moderate aspiration risk;Severe aspiration risk    Diet Recommendation NPO except meds   Medication Administration: Crushed with puree    Other  Recommendations Oral Care Recommendations: Oral care QID   Follow up Recommendations   (  tba)    Frequency and Duration min 2x/week  2 weeks       Prognosis Prognosis for Safe Diet Advancement: Good      Swallow Study   General HPI: 82 y.o. female with a history of temporal hemorrhagic stroke, possible seizures, cirrhosis, HTN, CAD and thrombocytopenia who presented with AMS and right facial droop. MRI negative for acute infarct. Pt with previous swallowing evaluations including MBS in 2013 recommending Dys 3 diet and thin liquids. At the time pt did have penetration of thin liquids, but  reflexive coughing was effective at clearing. Daughter reports no coughing with intake PTA. Type of Study: Bedside Swallow Evaluation Previous Swallow Assessment: see HPI Diet Prior to this Study: NPO Temperature Spikes Noted: Yes (101.6) Respiratory Status: Room air History of Recent Intubation: No Behavior/Cognition: Lethargic/Drowsy;Requires cueing;Other (Comment) (HOH) Oral Cavity Assessment: Within Functional Limits Oral Care Completed by SLP: No Oral Cavity - Dentition: Edentulous Self-Feeding Abilities: Needs assist Patient Positioning: Upright in bed Baseline Vocal Quality: Normal Volitional Cough: Cognitively unable to elicit Volitional Swallow: Unable to elicit    Oral/Motor/Sensory Function Overall Oral Motor/Sensory Function: Mild impairment Facial ROM: Reduced right;Suspected CN VII (facial) dysfunction Facial Symmetry: Abnormal symmetry right;Suspected CN VII (facial) dysfunction Facial Strength: Reduced right;Suspected CN VII (facial) dysfunction Lingual Symmetry: Within Functional Limits Mandible: Within Functional Limits   Ice Chips Ice chips: Impaired Presentation: Spoon Pharyngeal Phase Impairments: Decreased hyoid-laryngeal movement;Suspected delayed Swallow   Thin Liquid Thin Liquid: Impaired Presentation: Spoon Oral Phase Impairments: Poor awareness of bolus Pharyngeal  Phase Impairments: Suspected delayed Swallow;Decreased hyoid-laryngeal movement;Cough - Delayed    Nectar Thick Nectar Thick Liquid: Not tested   Honey Thick Honey Thick Liquid: Not tested   Puree Puree: Impaired Presentation: Spoon Pharyngeal Phase Impairments: Suspected delayed Swallow;Decreased hyoid-laryngeal movement   Solid   GO   Solid: Not tested    Functional Assessment Tool Used: skilled clinical judgment Functional Limitations: Swallowing Swallow Current Status (Z6109(G8996): At least 60 percent but less than 80 percent impaired, limited or restricted Swallow Goal Status  [phone](G8997): At least 20 percent but less than 40 percent impaired, limited or restricted   Maxcine HamLaura Paiewonsky, M.A. CCC-SLP [phone]  Maxcine Hamaiewonsky, Mong Neal 06/14/2015,10:52 AM

## 2015-06-15 ENCOUNTER — Inpatient Hospital Stay (HOSPITAL_COMMUNITY)
Admit: 2015-06-15 | Discharge: 2015-06-15 | Disposition: A | Discharge status: Home / Self Care | Payer: Medicare Other | Attending: Neurology | Admitting: Neurology

## 2015-06-15 DIAGNOSIS — N390 Urinary tract infection, site not specified: Secondary | ICD-10-CM

## 2015-06-15 DIAGNOSIS — E7220 Disorder of urea cycle metabolism, unspecified: Secondary | ICD-10-CM

## 2015-06-15 LAB — CBC
HCT: 35.5 % — ABNORMAL LOW (ref 36.0–46.0)
Hemoglobin: 12 g/dL (ref 12.0–15.0)
MCH: 27 pg (ref 26.0–34.0)
MCHC: 33.8 g/dL (ref 30.0–36.0)
MCV: 80 fL (ref 78.0–100.0)
PLATELETS: 92 10*3/uL — AB (ref 150–400)
RBC: 4.44 MIL/uL (ref 3.87–5.11)
RDW: 25.4 % — AB (ref 11.5–15.5)
WBC: 3.6 10*3/uL — AB (ref 4.0–10.5)

## 2015-06-15 LAB — BASIC METABOLIC PANEL
ANION GAP: 13 (ref 5–15)
BUN: 16 mg/dL (ref 6–20)
CALCIUM: 8.9 mg/dL (ref 8.9–10.3)
CO2: 22 mmol/L (ref 22–32)
CREATININE: 0.89 mg/dL (ref 0.44–1.00)
Chloride: 101 mmol/L (ref 101–111)
GFR, EST NON AFRICAN AMERICAN: 55 mL/min — AB (ref >60)
Glucose, Bld: 94 mg/dL (ref 65–99)
Potassium: 3.1 mmol/L — ABNORMAL LOW (ref 3.5–5.1)
SODIUM: 136 mmol/L (ref 135–145)

## 2015-06-15 MED ORDER — SODIUM CHLORIDE 0.45 % IV SOLN
INTRAVENOUS | Status: DC
  Administered 2015-06-15 – 2015-06-16 (×2): via INTRAVENOUS
  Filled 2015-06-15 (×8): qty 1000

## 2015-06-15 MED ORDER — HALOPERIDOL LACTATE 5 MG/ML IJ SOLN
1.0000 mg | Freq: Four times a day (QID) | INTRAMUSCULAR | Status: DC | PRN
  Administered 2015-06-15: 1 mg via INTRAVENOUS
  Filled 2015-06-15: qty 1

## 2015-06-15 MED ORDER — LACTULOSE ENEMA
300.0000 mL | Freq: Once | ORAL | Status: AC
  Administered 2015-06-15: 300 mL via RECTAL
  Filled 2015-06-15: qty 300

## 2015-06-15 MED ORDER — WHITE PETROLATUM GEL
Status: AC
  Administered 2015-06-15: 0.2
  Filled 2015-06-15: qty 1

## 2015-06-15 NOTE — Progress Notes (Signed)
Physical Therapy Treatment Patient Details Name: Anne Hanna MRN: [id number] DOB: 1922-09-17 Today's Date: 06/15/2015    History of Present Illness Anne Hanna is an 82 y.o. female with a history of temporal hemorrhagic stroke, possible seizures, cirrhosis, HTN, CAD and thrombocytopenia who presented with AMS. She was recently hospitalized for acute respiratory failure. Family states she was confused at that time as well. Yesterday she was last seen to be normal at 5 PM. Her daughter subsequently found her unresponsive this AM, with concern for left facial droop.    PT Comments    Pt performed increased activity and follow commands better but remains confused.  Pt reports," I want to tone my booty and Ive heard you can go from 125-120 pounds in no time.  Do I owe you 14$?" Pt unable to recognize daughter in room.    Follow Up Recommendations  SNF;Supervision/Assistance - 24 hour     Equipment Recommendations  None recommended by PT    Recommendations for Other Services       Precautions / Restrictions Precautions Precautions: Fall Restrictions Weight Bearing Restrictions: No    Mobility  Bed Mobility Overal bed mobility: Needs Assistance Bed Mobility: Supine to Sit     Supine to sit: Supervision     General bed mobility comments: Pt required cues for position to move to edge of bed from supine position.  pt demonstrates good trunk control.    Transfers Overall transfer level: Needs assistance Equipment used: Rolling walker (2 wheeled) Transfers: Sit to/from Stand Sit to Stand: Min assist;Mod assist Stand pivot transfers: Min assist;Mod assist       General transfer comment: Pt required cues for hand placement and trunk control.  Pt also required facilitation to move from standing to sitting.    Ambulation/Gait Ambulation/Gait assistance: Mod assist Ambulation Distance (Feet): 70 Feet Assistive device: Rolling walker (2 wheeled) Gait Pattern/deviations:  Step-through pattern;Trunk flexed;Narrow base of support     General Gait Details: Pt required assist to move RW to turn and maintain correct position.  pt required cues to advance steps forward after intermittent pauses and babbling conversations.     Stairs            Wheelchair Mobility    Modified Rankin (Stroke Patients Only)       Balance     Sitting balance-Leahy Scale: Fair       Standing balance-Leahy Scale: Poor                      Cognition Arousal/Alertness: Lethargic;Suspect due to medications Behavior During Therapy: Restless Overall Cognitive Status: Impaired/Different from baseline Area of Impairment: Orientation;Attention;Memory;Safety/judgement;Following commands;Awareness;Problem solving (pt intermittently follows commands.  ) Orientation Level: Disoriented to;Situation;Time;Place Current Attention Level: Focused Memory: Decreased short-term memory Following Commands: Follows one step commands inconsistently   Awareness: Intellectual Problem Solving: Slow processing;Decreased initiation;Difficulty sequencing;Requires verbal cues;Requires tactile cues General Comments: Pt allowed PTA to place dentures in mouth.  Pt required increased time to follow commands.      Exercises      General Comments        Pertinent Vitals/Pain Pain Assessment: Faces Faces Pain Scale: No hurt    Home Living                      Prior Function            PT Goals (current goals can now be found in the care plan section) Acute Rehab  PT Goals Patient Stated Goal: none stated  Potential to Achieve Goals: Good Progress towards PT goals: Progressing toward goals    Frequency  Min 3X/week    PT Plan      Co-evaluation             End of Session Equipment Utilized During Treatment: Gait belt Activity Tolerance: Patient tolerated treatment well Patient left: in chair;with call bell/phone within reach;with chair alarm set;with  family/visitor present     Time: [id number] PT Time Calculation (min) (ACUTE ONLY): 32 min  Charges:  $Gait Training: 8-22 mins $Therapeutic Activity: 8-22 mins                    G Codes:      Florestine Aversimee J Raghad Lorenz 06/15/2015, 10:13 AM  Joycelyn RuaAimee Esaias Cleavenger, PTA pager [phone]

## 2015-06-15 NOTE — Progress Notes (Signed)
Pt agitated during shift. Several attempts to get up out of bed and removing IV and telemetry when her daughters are not in her room. Pt stated to this nurse that she does not like to be alone. Confusion observed, easily redirected at times. No noted distress. Safety measures in place. Call bell within reach. Will continue to monitor.

## 2015-06-15 NOTE — NC FL2 (Signed)
[place] MEDICAID FL2 LEVEL OF CARE SCREENING TOOL     IDENTIFICATION  Patient Name: Anne Hanna Birthdate: 02/23/1923 Sex: female Admission Date (Current Location): 06-17-2015  [facility] and [place] Number:  Producer, television/film/video and Address:  [facility], [address], [place], [place] [address]      Provider Number: [id number]  Attending Physician Name and Address:  Eddie North, MD  Relative Name and Phone Number:       Current Level of Care: Hospital Recommended Level of Care: Skilled Nursing Facility Prior Approval Number:    Date Approved/Denied:   PASRR Number:    Discharge Plan: SNF    Current Diagnoses: Patient Active Problem List   Diagnosis Date Noted  . Altered mental status 06/17/15  . Confusion 06/17/15  . Elevated troponin 05/26/2015  . Dyspnea 05/26/2015  . Symptomatic anemia 05/26/2015  . Acute kidney injury (HCC) 05/26/2015  . Jaundice 05/26/2015  . Acute respiratory failure with hypoxia (HCC) 05/26/2015  . CAP (community acquired pneumonia) 05/26/2015  . Cirrhosis (HCC)   . Memory loss 08/29/2012  . Other specified visual disturbances 08/29/2012  . UTI (lower urinary tract infection) 03/09/2012  . Cerebral brain hemorrhage (HCC) 03/05/2012  . Unspecified intracranial hemorrhage 03/04/2012  . Seizure (HCC) 03/04/2012  . Anemia 03/04/2012  . Coronary artery disease   . Hypertension   . Hypercholesterolemia   . CVA (cerebral vascular accident) (HCC)   . Hx: UTI (urinary tract infection)   . Glaucoma     Orientation RESPIRATION BLADDER Height & Weight     Self  Normal Continent Weight:   Height:     BEHAVIORAL SYMPTOMS/MOOD NEUROLOGICAL BOWEL NUTRITION STATUS  Other (Comment) (Poor Safety Awareness, Poor Judgement, Poor attention/ concentration, Impulsive, ) Convulsions/Seizures (Possible Seziures) Incontinent Diet (Currently NPO)  AMBULATORY STATUS COMMUNICATION OF NEEDS Skin   Limited Assist Verbally  Normal                       Personal Care Assistance Level of Assistance  Bathing, Feeding, Dressing Bathing Assistance: Limited assistance Feeding assistance: Limited assistance Dressing Assistance: Maximum assistance     Functional Limitations Info  Sight, Hearing, Speech Sight Info: Adequate Hearing Info: Impaired (Hard of Hearing) Speech Info: Adequate    SPECIAL CARE FACTORS FREQUENCY  PT (By licensed PT), OT (By licensed OT)     PT Frequency: 5x/week OT Frequency: 5x/week            Contractures Contractures Info: Not present    Additional Factors Info  Allergies, Code Status Code Status Info: DNR Allergies Info: Dilantin, Amlodipine, Lipitor           Current Medications (06/15/2015):  This is the current hospital active medication list Current Facility-Administered Medications  Medication Dose Route Frequency Provider Last Rate Last Dose  . acetaminophen (TYLENOL) suppository 650 mg  650 mg Rectal Q4H PRN Roma Kayser Schorr, NP      . antiseptic oral rinse (CPC / CETYLPYRIDINIUM CHLORIDE 0.05%) solution 7 mL  7 mL Mouth Rinse q12n4p Nishant Dhungel, MD   7 mL at 06/15/15 1200  . bisacodyl (DULCOLAX) suppository 10 mg  10 mg Rectal Daily PRN Marcos Eke, PA-C      . cefTRIAXone (ROCEPHIN) 1 g in dextrose 5 % 50 mL IVPB  1 g Intravenous Q24H Alberteen Sam, MD   1 g at 06/14/15 1951  . chlorhexidine (PERIDEX) 0.12 % solution 15 mL  15  mL Mouth Rinse BID Nishant Dhungel, MD   15 mL at 06/15/15 1408  . haloperidol lactate (HALDOL) injection 1 mg  1 mg Intravenous Q6H PRN Nishant Dhungel, MD      . morphine 2 MG/ML injection 1 mg  1 mg Intravenous Q4H PRN Marcos EkeSara E Wertman, PA-C      . ondansetron [facility](ZOFRAN) tablet 4 mg  4 mg Oral Q6H PRN Marcos EkeSara E Wertman, PA-C       Or  . ondansetron [facility](ZOFRAN) injection 4 mg  4 mg Intravenous Q6H PRN Marcos EkeSara E Wertman, PA-C      . sodium chloride 0.45 % 1,000 mL with potassium chloride 40 mEq infusion   Intravenous  Continuous Nishant Dhungel, MD 75 mL/hr at 06/15/15 1415    . sodium chloride flush (NS) 0.9 % injection 3 mL  3 mL Intravenous Q12H Marcos EkeSara E Wertman, PA-C   3 mL at 06/14/15 1045     Discharge Medications: Please see discharge summary for a list of discharge medications.  Relevant Imaging Results:  Relevant Lab Results:   Additional Information SS# [id number]   Anne Hanna BSW Intern, [phone]

## 2015-06-15 NOTE — Progress Notes (Addendum)
[facility] PROGRESS NOTE  Anne BeckmannMary F Hanna BZJ:[id number] DOB: 08/01/1922 DOA: 09-04-2015 PCP: Lupita RaiderSHAW,KIMBERLEE, MD  Brief narrative 82 year old female with history of hemorrhagic stroke in 2013, CAD, hypertension, cirrhosis of liver with chronic thrombocytopenia ,  hospitalized 2 weeks back with acute respiratory failure and severe anemia with iron deficiency requiring transfusion,? History of seizures presented with acute encephalopathy.  Daughter found patient to be unresponsive in the morning with concern for left facial droop. When EMS arrived patient was awake but not following commands.  Patient lives independently and is capable of her ADLs. No history of dementia??. Vitals in the ED were stable. Blood work unremarkable except for chronic low platelets, chemistry showed potassium 3.1 and creatinine of 1.19, glucose of 211. Normal LFTs. UA suggestive of UTI. Patient admitted to hospitalist service. Neurology consulted.  Assessment/Plan: Acute encephalopathy Possibly metabolic (UTI, hepatic encephalopathy), versus postictal. MRI brain negative for acute ischemia. Shows chronic left subdural hematoma. -Patient remains encephalopathic with sundowning. On empiric Rocephin for UTI. Cultures growing Escherichia coli and gram-positive cocci.. Chest x-ray negative for infection. -Ammonia level of 54. Getting daily lactulose enema. B12 and TSH normal. Follow EEG results. -Neurology consult appreciated. -I suspect patient has underlying dementia (daughter report patient having sundowning symptoms) which is flared up from acute metabolic insult. -Avoid benzodiazepines. Use when necessary Haldol for agitation and sundowning.  Liver cirrhosis with chronic Thrombocytopenia  history of alcohol use. LFTs normal. Follow-up with lactulose enema.  Anemia of chronic chronic disease, iron deficiency  Recently hospitalized with hemoglobin of 5.5 requiring 4 unit PRBC. Currently stable. Continue iron  supplement.  Hyperlipidemia Resume statin once able to take by mouth.  Hypokalemia  replenish with IV fluids  DVT prophylaxis: SCDs Diet: Nothing by mouth  Code Status: DNR Family Communication: Daughters at bedside Disposition Plan: Need skilled nursing facility when encephalopathy resolves.   Consultants:  Neurology  Procedures:  MRI brain  EEG  Antibiotics:  IV rocephin  HPI/Subjective: Seen and examined . Remains very confused . Severe sundowning last evening.  Objective: Filed Vitals:   06/15/15 0529 06/15/15 0924  BP: 162/88 151/83  Pulse: 110 105  Temp: 98 F (36.7 C) 99 F (37.2 C)  Resp: 20 20    Intake/Output Summary (Last 24 hours) at 06/15/15 1355 Last data filed at 06/14/15 1643  Gross per 24 hour  Intake      1 ml  Output      0 ml  Net      1 ml   There were no vitals filed for this visit.  Exam:   General:  Elderly female not in distress, Confused  HEENT: No pallor, moist mucosa  Chest: Clear bilaterally  CVS: Normal S1 and S2, no murmurs rub or gallop  GI: Soft, nondistended, nontender, bowel sounds present  Musculoskeletal: Warm, no edema  CNS: Hard of hearing, extremely confused,, completely disoriented.    Data Reviewed: Basic Metabolic Panel:  Recent Labs Lab 08-29-15 1047 08-29-15 1049 06/14/15 0637 06/15/15 0308  NA 134* 135 137 136  K 3.1* 3.1* 3.1* 3.1*  CL 97* 99* 101 101  CO2  --  21* 24 22  GLUCOSE 206* 211* 82 94  BUN 21* 19 18 16   CREATININE 0.90 1.19* 0.93 0.89  CALCIUM  --  9.6 9.2 8.9   Liver Function Tests:  Recent Labs Lab 08-29-15 1049 06/14/15 0637  AST 46* 63*  ALT 22 24  ALKPHOS 64 50  BILITOT 0.7 1.2  PROT 6.8 6.0*  ALBUMIN  3.9 3.4*   No results for input(s): LIPASE, AMYLASE in the last 168 hours.  Recent Labs Lab 06/16/2015 1555 06/14/15 1157  AMMONIA 47* 54*   CBC:  Recent Labs Lab 05/19/2015 1047 06/10/2015 1049 06/14/15 0637 06/15/15 0308  WBC  --  3.8* 4.8 3.6*   NEUTROABS  --  3.2  --   --   HGB 13.9 11.9* 11.8* 12.0  HCT 41.0 37.1 36.3 35.5*  MCV  --  81.0 80.1 80.0  PLT  --  93* 89* 92*   Cardiac Enzymes: No results for input(s): CKTOTAL, CKMB, CKMBINDEX, TROPONINI in the last 168 hours. BNP (last 3 results)  Recent Labs  05/26/15 2211  BNP 1485.7*    ProBNP (last 3 results) No results for input(s): PROBNP in the last 8760 hours.  CBG: No results for input(s): GLUCAP in the last 168 hours.  Recent Results (from the past 240 hour(s))  Culture, Urine     Status: None (Preliminary result)   Collection Time: 05/30/2015 11:30 AM  Result Value Ref Range Status   Specimen Description URINE, RANDOM  Final   Special Requests ADDED 2120  Final   Culture   Final    >=100,000 COLONIES/mL ESCHERICHIA COLI >=100,000 COLONIES/mL GRAM POSITIVE COCCI    Report Status PENDING  Incomplete     Studies: Dg Chest Port 1 View  06/14/2015  CLINICAL DATA:  Fever for 1 day, history coronary artery disease, stroke, cirrhosis, hypertension EXAM: PORTABLE CHEST 1 VIEW COMPARISON:  Portable exam 1652 hours compared to 05/26/2015 FINDINGS: Enlargement of cardiac silhouette post CABG. Mediastinal contours and pulmonary vascularity normal. Atherosclerotic calcification and elongation of thoracic aorta. Pleuroparenchymal opacities at BILATERAL lung apices LEFT greater than RIGHT unchanged, likely postinflammatory scarring. Eventration of RIGHT diaphragm unchanged. No definite acute infiltrate, pleural effusion or pneumothorax. Minimal RIGHT basilar atelectasis. Bones demineralized with thoracolumbar scoliosis. IMPRESSION: Enlargement of cardiac silhouette post CABG. Biapical scarring and minimal RIGHT basilar atelectasis. Electronically Signed   By: Ulyses Southward M.D.   On: 06/14/2015 17:33   Mr Brain Ltd W/o Cm  06/14/2015  CLINICAL DATA:  Altered mental status, unresponsive this morning. LEFT facial droop. History of stroke, seizures, cirrhosis, hypertension and  thrombocytopenia. EXAM: MRI HEAD WITHOUT CONTRAST TECHNIQUE: Axial and coronal diffusion weighted imaging of the brain and surrounding structures were obtained without intravenous contrast. COMPARISON:  CT head June 13, 2015 and MRI of the brain March 05, 2012 FINDINGS: Per technologist note was unable to tolerate further imaging, on sedation. No reduced diffusion to suggest acute ischemia. Trace likely chronic LEFT holohemispheric subdural hematoma though new from prior MRI. Moderate to severe ventriculomegaly on the basis of global parenchymal brain volume loss. IMPRESSION: Limited diffusion weighted imaging MRI of the brain: No acute ischemia. Trace, likely chronic LEFT subdural hematoma. Electronically Signed   By: Awilda Metro M.D.   On: 06/14/2015 05:34    Scheduled Meds: . antiseptic oral rinse  7 mL Mouth Rinse q12n4p  . cefTRIAXone (ROCEPHIN)  IV  1 g Intravenous Q24H  . chlorhexidine  15 mL Mouth Rinse BID  . lactulose  300 mL Rectal Once  . sodium chloride flush  3 mL Intravenous Q12H   Continuous Infusions: . sodium chloride 50 mL/hr at 06/15/15 0051  . dextrose 5 % and 0.9% NaCl         Time spent: 25 minutes    Silvano Garofano  [facility] Pager [phone]. If 7PM-7AM, please contact night-coverage at [email or web address], password [facility] 06/15/2015, 1:55  PM  LOS: 1 day

## 2015-06-15 NOTE — Evaluation (Signed)
Speech Language Pathology Evaluation Patient Details Name: Anne Hanna MRN: [id number] DOB: 01/01/1923 Today's Date: 06/15/2015 Time: [id number] SLP Time Calculation (min) (ACUTE ONLY): 10 min  Problem List:  Patient Active Problem List   Diagnosis Date Noted  . Altered mental status 09-02-2015  . Confusion 09-02-2015  . Elevated troponin 05/26/2015  . Dyspnea 05/26/2015  . Symptomatic anemia 05/26/2015  . Acute kidney injury (HCC) 05/26/2015  . Jaundice 05/26/2015  . Acute respiratory failure with hypoxia (HCC) 05/26/2015  . CAP (community acquired pneumonia) 05/26/2015  . Cirrhosis (HCC)   . Memory loss 08/29/2012  . Other specified visual disturbances 08/29/2012  . UTI (lower urinary tract infection) 03/09/2012  . Cerebral brain hemorrhage (HCC) 03/05/2012  . Unspecified intracranial hemorrhage 03/04/2012  . Seizure (HCC) 03/04/2012  . Anemia 03/04/2012  . Coronary artery disease   . Hypertension   . Hypercholesterolemia   . CVA (cerebral vascular accident) (HCC)   . Hx: UTI (urinary tract infection)   . Glaucoma    Past Medical History:  Past Medical History  Diagnosis Date  . Coronary artery disease     STATUS POST CABG  . Hypertension   . Hypercholesterolemia   . CVA (cerebral vascular accident) (HCC)     OCULAR CVA  . Hx: UTI (urinary tract infection)   . Glaucoma     History of glaucoma  . Bronchopneumonia     Right lung  . Urinary tract infection     Escherichia coli urinary tract infection  . Acute sinusitis   . Thrombocytopenia (HCC) 2010     platelet count of 96 at discharge (95 on admission)  . Hyperlipidemia   . History of transient ischemic attack   . Hyponatremia     volume depletion - resolved  . Volume overload     History of postoperative volume overload  . Delirium     Postoperative delirium  . Cataract   . CVA (cerebral infarction)     temporal hemorhage  . Gallstones   . Hepatic cyst   . Cirrhosis (HCC)     per endo 2015    Past Surgical History:  Past Surgical History  Procedure Laterality Date  . Cardiac catheterization  10/05/2006    NORMAL LEFT VENTRICULAR SIZE. THERE IS MODERATE GLOBAL HYPOKINESIA WITH OVERALL EF 40%  . Coronary artery bypass graft  09/2006    LIMA GRAFT TO THE LAD, SAPHENOUS VEIN GRAFT TO THE FIRST OBTUSE MARGINAL VESSEL WITH SEQUENTIAL GRAFT TO THE SECOND MARGINAL VESSEL , AND SAPHENOUS VEIN GRAFT TO THE PDA  . Cataract surgery      Bilateral cataract extraction-- x2  . Tendon repair      Status post repair of tendon of the left index finger  . Esophagogastroduodenoscopy N/A 04/22/2013    Procedure: ESOPHAGOGASTRODUODENOSCOPY (EGD);  Surgeon: Willis ModenaWilliam Outlaw, MD;  [facility];  Service: Endoscopy;  Laterality: N/A;   HPI:  82 y.o. female with a history of temporal hemorrhagic stroke, possible seizures, cirrhosis, HTN, CAD and thrombocytopenia who presented with AMS and right facial droop. MRI negative for acute infarct. Pt with previous swallowing evaluations including MBS in 2013 recommending Dys 3 diet and thin liquids. At the time pt did have penetration of thin liquids, but reflexive coughing was effective at clearing. Daughter reports no coughing with intake PTA.   Assessment / Plan / Recommendation Clinical Impression  Pt is more alert this morning but oriented to person only. Fleeting sustained attention and suspect difficulty hearing make it  difficulty for her to follow simple one-step commands. She is frequently calling out to other people who are not in the room. Speech is slurred with intelligibility reduced at the phrase level. Work up still underway per neurology - will continue to follow to maximize function and safety.   **Unfortunately, pt refused all attempts at PO trials this morning. Despite encouragement from SLP, pt started to become verbally and physically aggressive toward therapist, therefore further attempts were held. Will continue efforts.    SLP  Assessment  Patient needs continued Speech Lanaguage Pathology Services    Follow Up Recommendations  Skilled Nursing facility    Frequency and Duration min 2x/week  2 weeks      SLP Evaluation Prior Functioning  Cognitive/Linguistic Baseline: Information not available   Cognition  Overall Cognitive Status: No family/caregiver present to determine baseline cognitive functioning Arousal/Alertness: Awake/alert Orientation Level: Oriented to person;Disoriented to place;Disoriented to situation Attention: Sustained Sustained Attention: Impaired Sustained Attention Impairment: Verbal basic Behaviors: Verbal agitation;Physical agitation Safety/Judgment: Impaired    Comprehension  Auditory Comprehension Overall Auditory Comprehension: Impaired Yes/No Questions: Impaired Basic Biographical Questions: 26-50% accurate Commands: Impaired One Step Basic Commands: 25-49% accurate Interfering Components: Attention Reading Comprehension Reading Status: Not tested    Expression Expression Primary Mode of Expression: Verbal Verbal Expression Overall Verbal Expression:  (appears fluent during conversation)   Oral / Motor  Motor Speech Overall Motor Speech: Impaired Respiration: Within functional limits Phonation: Normal Resonance: Within functional limits Articulation: Impaired Level of Impairment: Phrase Intelligibility: Intelligibility reduced Phrase: 50-74% accurate   GO                   Maxcine Ham, M.A. CCC-SLP [phone]  Maxcine Ham 06/15/2015, 9:30 AM

## 2015-06-15 NOTE — Progress Notes (Signed)
Interval History:                                                                                                                      Anne Hanna is an 82 y.o. female patient with  history of temporal hemorrhagic stroke, possible seizures, cirrhosis, HTN, CAD and thrombocytopenia who presented with AMS. She was recently hospitalized for acute respiratory failure. Family states she was confused at that time as well. Yesterday she was last seen to be normal at 5 PM. Her daughter subsequently found her unresponsive this AM, with concern for left facial droop. During transport by EMS, she was awake, but not following commands. She did have any fever, chest pain, cough, headache, neck stiffness or worsened SOB per daughter. Also no EtOH or drug abuse. CT head in the ED was unremarkable and urine was clear.   MRI brain was limited but showed no acute infarct. Today continues to be confused with dysarthria.    Past Medical History: Past Medical History  Diagnosis Date  . Coronary artery disease     STATUS POST CABG  . Hypertension   . Hypercholesterolemia   . CVA (cerebral vascular accident) (HCC)     OCULAR CVA  . Hx: UTI (urinary tract infection)   . Glaucoma     History of glaucoma  . Bronchopneumonia     Right lung  . Urinary tract infection     Escherichia coli urinary tract infection  . Acute sinusitis   . Thrombocytopenia (HCC) 2010     platelet count of 96 at discharge (95 on admission)  . Hyperlipidemia   . History of transient ischemic attack   . Hyponatremia     volume depletion - resolved  . Volume overload     History of postoperative volume overload  . Delirium     Postoperative delirium  . Cataract   . CVA (cerebral infarction)     temporal hemorhage  . Gallstones   . Hepatic cyst   . Cirrhosis (HCC)     per endo 2015    Past Surgical History  Procedure Laterality Date  . Cardiac catheterization  10/05/2006    NORMAL LEFT VENTRICULAR SIZE. THERE IS MODERATE  GLOBAL HYPOKINESIA WITH OVERALL EF 40%  . Coronary artery bypass graft  09/2006    LIMA GRAFT TO THE LAD, SAPHENOUS VEIN GRAFT TO THE FIRST OBTUSE MARGINAL VESSEL WITH SEQUENTIAL GRAFT TO THE SECOND MARGINAL VESSEL , AND SAPHENOUS VEIN GRAFT TO THE PDA  . Cataract surgery      Bilateral cataract extraction-- x2  . Tendon repair      Status post repair of tendon of the left index finger  . Esophagogastroduodenoscopy N/A 04/22/2013    Procedure: ESOPHAGOGASTRODUODENOSCOPY (EGD);  Surgeon: Willis Modena, MD;  Location: [facility] ENDOSCOPY;  Service: Endoscopy;  Laterality: N/A;    Family History: Family History  Problem Relation Age of Onset  . Heart failure Mother   . Heart failure Father   . Heart  disease Brother     Social History:   reports that she has never smoked. She has never used smokeless tobacco. She reports that she does not drink alcohol or use illicit drugs.  Allergies:  Allergies  Allergen Reactions  . Dilantin [Phenytoin]   . Amlodipine Swelling  . Lipitor [Atorvastatin Calcium]     HEADACHES     Medications:                                                                                                                         Current facility-administered medications:  .  0.9 %  sodium chloride infusion, , Intravenous, Continuous, Marcos Eke, PA-C, Last Rate: 50 mL/hr at 06/15/15 0051 .  acetaminophen (TYLENOL) suppository 650 mg, 650 mg, Rectal, Q4H PRN, Roma Kayser Schorr, NP .  antiseptic oral rinse (CPC / CETYLPYRIDINIUM CHLORIDE 0.05%) solution 7 mL, 7 mL, Mouth Rinse, q12n4p, Nishant Dhungel, MD, 7 mL at 06/14/15 1631 .  bisacodyl (DULCOLAX) suppository 10 mg, 10 mg, Rectal, Daily PRN, Marcos Eke, PA-C .  cefTRIAXone (ROCEPHIN) 1 g in dextrose 5 % 50 mL IVPB, 1 g, Intravenous, Q24H, Alberteen Sam, MD, 1 g at 06/14/15 1951 .  chlorhexidine (PERIDEX) 0.12 % solution 15 mL, 15 mL, Mouth Rinse, BID, Nishant Dhungel, MD, 15 mL at 06/14/15 2205 .   dextrose 5 %-0.9 % sodium chloride infusion, , Intravenous, Continuous, Caryl Pina, MD .  morphine 2 MG/ML injection 1 mg, 1 mg, Intravenous, Q4H PRN, Marcos Eke, PA-C .  ondansetron (ZOFRAN) tablet 4 mg, 4 mg, Oral, Q6H PRN **OR** ondansetron (ZOFRAN) injection 4 mg, 4 mg, Intravenous, Q6H PRN, Marcos Eke, PA-C .  sodium chloride flush (NS) 0.9 % injection 3 mL, 3 mL, Intravenous, Q12H, Marcos Eke, PA-C, 3 mL at 06/14/15 1045   Neurologic Examination:                                                                                                     Today's Vitals   06/15/15 0247 06/15/15 0406 06/15/15 0529 06/15/15 0803  BP: 163/106 168/86 162/88   Pulse: 134 104 110   Temp: 98.4 F (36.9 C) 98.2 F (36.8 C) 98 F (36.7 C)   TempSrc: Oral Oral Axillary   Resp: 20 20 20    SpO2: 96% 96% 98%   PainSc:    0-No pain   Evaluation of higher integrative functions including: Level of alertness: Alert.  Oriented: dysarthric and talking nonsensically --hard to gather Speech:dysarthric and hard to understand--no clear aphasia noted.  Test  the following cranial nerves: PERRLA, EOMI, TML, right facial droop.  Motor examination:moving all extremities antigravity equally Examination of sensation : Normal and symmetric sensation to pinprick in all 4 extremities and on face Examination of deep tendon reflexes: 2+, normal and symmetric in all extremities, no babinski bilaterally    Lab Results: Basic Metabolic Panel:  Recent Labs Lab 05/25/2015 1047 06/06/2015 1049 06/14/15 0637 06/15/15 0308  NA 134* 135 137 136  K 3.1* 3.1* 3.1* 3.1*  CL 97* 99* 101 101  CO2  --  21* 24 22  GLUCOSE 206* 211* 82 94  BUN 21* CREATININE 0.90 1.19* 0.93 0.89  CALCIUM  --  9.6 9.2 8.9    Liver Function Tests:  Recent Labs Lab 05/22/2015 1049 06/14/15 0637  AST 46* 63*  ALT 22 24  ALKPHOS 64 50  BILITOT 0.7 1.2  PROT 6.8 6.0*  ALBUMIN 3.9 3.4*   No results for  input(s): LIPASE, AMYLASE in the last 168 hours.  Recent Labs Lab 05/31/2015 1555 06/14/15 1157  AMMONIA 47* 54*    CBC:  Recent Labs Lab 05/26/2015 1047 06/03/2015 1049 06/14/15 0637 06/15/15 0308  WBC  --  3.8* 4.8 3.6*  NEUTROABS  --  3.2  --   --   HGB 13.9 11.9* 11.8* 12.0  HCT 41.0 37.1 36.3 35.5*  MCV  --  81.0 80.1 80.0  PLT  --  93* 89* 92*    Cardiac Enzymes: No results for input(s): CKTOTAL, CKMB, CKMBINDEX, TROPONINI in the last 168 hours.  Lipid Panel:  Recent Labs Lab 06/14/15 0828  CHOL 135  TRIG 59  HDL 65  CHOLHDL 2.1  VLDL 12  LDLCALC 58    CBG: No results for input(s): GLUCAP in the last 168 hours.  Microbiology: Results for orders placed or performed during the hospital encounter of 05/22/2015  Culture, Urine     Status: None (Preliminary result)   Collection Time: 06/09/2015 11:30 AM  Result Value Ref Range Status   Specimen Description URINE, RANDOM  Final   Special Requests ADDED 2120  Final   Culture TOO YOUNG TO READ  Final   Report Status PENDING  Incomplete    Imaging: Ct Head Wo Contrast  05/28/2015  CLINICAL DATA:  Altered mental status. Unequal pupils. Left-sided facial droop. EXAM: CT HEAD WITHOUT CONTRAST TECHNIQUE: Contiguous axial images were obtained from the base of the skull through the vertex without intravenous contrast. COMPARISON:  03/04/2012; brain MRI- 03/05/2012 FINDINGS: Examination is degraded due to patient motion artifact and patient's inability to cooperate with instructions. Re- demonstrated advanced atrophy with sulcal prominence centralized volume loss with commensurate ex vacuo dilatation of the ventricular system. Extensive periventricular hypodensities compatible with microvascular ischemic disease. Given background parenchymal abnormalities and patient motion, there is no definitive since CT evidence of acute superimposed large territory infarct. No intraparenchymal or extra-axial mass or hemorrhage. Unchanged size  and configuration of the ventricles and basilar cisterns. Mild (approximately 6 mm) of left-to-right midline shift is unchanged since the 02/2012 examination and likely attributable to asymmetric atrophy. Intracranial atherosclerosis. Limited visualization of the paranasal sinuses and mastoid air cells is normal. No air-fluid levels. Regional soft tissues appear normal. Post bilateral cataract surgery. No definitive displaced calvarial fracture. IMPRESSION: Similar findings of advanced atrophy and microvascular ischemic disease without definitive superimposed acute large territory infarct on this motion degraded examination. Electronically Signed   By: Simonne Come M.D.   On: 06/17/2015 12:51   Dg  Chest Port 1 View  06/14/2015  CLINICAL DATA:  Fever for 1 day, history coronary artery disease, stroke, cirrhosis, hypertension EXAM: PORTABLE CHEST 1 VIEW COMPARISON:  Portable exam 1652 hours compared to 05/26/2015 FINDINGS: Enlargement of cardiac silhouette post CABG. Mediastinal contours and pulmonary vascularity normal. Atherosclerotic calcification and elongation of thoracic aorta. Pleuroparenchymal opacities at BILATERAL lung apices LEFT greater than RIGHT unchanged, likely postinflammatory scarring. Eventration of RIGHT diaphragm unchanged. No definite acute infiltrate, pleural effusion or pneumothorax. Minimal RIGHT basilar atelectasis. Bones demineralized with thoracolumbar scoliosis. IMPRESSION: Enlargement of cardiac silhouette post CABG. Biapical scarring and minimal RIGHT basilar atelectasis. Electronically Signed   By: Ulyses SouthwardMark  Boles M.D.   On: 06/14/2015 17:33   Mr Brain Ltd W/o Cm  06/14/2015  CLINICAL DATA:  Altered mental status, unresponsive this morning. LEFT facial droop. History of stroke, seizures, cirrhosis, hypertension and thrombocytopenia. EXAM: MRI HEAD WITHOUT CONTRAST TECHNIQUE: Axial and coronal diffusion weighted imaging of the brain and surrounding structures were obtained without  intravenous contrast. COMPARISON:  CT head June 13, 2015 and MRI of the brain March 05, 2012 FINDINGS: Per technologist note was unable to tolerate further imaging, on sedation. No reduced diffusion to suggest acute ischemia. Trace likely chronic LEFT holohemispheric subdural hematoma though new from prior MRI. Moderate to severe ventriculomegaly on the basis of global parenchymal brain volume loss. IMPRESSION: Limited diffusion weighted imaging MRI of the brain: No acute ischemia. Trace, likely chronic LEFT subdural hematoma. Electronically Signed   By: Awilda Metroourtnay  Bloomer M.D.   On: 06/14/2015 05:34   Echo: Study Conclusions  - Left ventricle: Septal apiical mid and basal inferior wall  akinesis The cavity size was moderately dilated. Wall thickness  was increased in a pattern of mild LVH. Systolic function was  moderately to severely reduced. The estimated ejection fraction  was in the range of 30% to 35%. - Mitral valve: Eccentric likely ischemic MR with restricted  posterior leafelt motion There was mild to moderate  regurgitation. - Atrial septum: No defect or patent foramen ovale was identified.   Assessment and plan:   Anne Hanna is an 82 y.o. female patient with AMS and possible seizure activity. Currently sedated from Ativan. Given history of seizure and CVA cannot rule out Seizure with post ictal state. No clear infectious etiology. UC pending. Will obtain EEG. TSH B12 and Folate normal. A1c and LDL WNL.  Ammonia continues to creep up --was 47 and now 54 (this could be etiology of her confusion.)   EEG, Carotid doppler pending  Will be seen by Dr. Lavon PaganiniNandigam. Please see his attestation note for A/P for any additional work up recommendations.

## 2015-06-15 NOTE — Progress Notes (Signed)
Routine EEG completed, results pending. 

## 2015-06-15 NOTE — Progress Notes (Signed)
Occupational Therapy Treatment Patient Details Name: Anne Hanna MRN: [id number] DOB: [birth year] Today's Date: 06/15/2015    History of present illness Anne Hanna is an 82 y.o. female with a history of temporal hemorrhagic stroke, possible seizures, cirrhosis, HTN, CAD and thrombocytopenia who presented with AMS. She was recently hospitalized for acute respiratory failure. Family states she was confused at that time as well. Yesterday she was last seen to be normal at 5 PM. Her daughter subsequently found her unresponsive this AM, with concern for left facial droop.   OT comments  Pt with very little improvement due to poor cognitive status.  Pt was able to walk to bathroom with assist of one but is not consistently following commands, is talking to people who are not there and overall is very confused.  Will decrease pt to 2x week and increase back to 3x/week when appropriate.  Follow Up Recommendations  SNF;Supervision/Assistance - 24 hour    Equipment Recommendations       Recommendations for Other Services      Precautions / Restrictions Precautions Precautions: Fall Restrictions Weight Bearing Restrictions: No       Mobility Bed Mobility Overal bed mobility: Needs Assistance Bed Mobility: Supine to Sit     Supine to sit: Mod assist     General bed mobility comments: Pt required tactile cues to get up.  Pt did not want to get to the EOB therefore resisted therapist.  Feel she could have done this task with less assist after seeing her walk with therapist to bathroom.  Transfers Overall transfer level: Needs assistance Equipment used: 1 person hand held assist Transfers: Sit to/from [employer] Sit to Stand: Min assist Stand pivot transfers: Min assist       General transfer comment: Pt required tactile cues to start all mobility due to poor cognition.    Balance Overall balance assessment: Needs assistance Sitting-balance support: Feet  supported Sitting balance-Leahy Scale: Fair Sitting balance - Comments: Pt could sit EOB for 5 min with S but then would lose balance with no warning.   Standing balance support: During functional activity;Bilateral upper extremity supported Standing balance-Leahy Scale: Poor Standing balance comment: Pt must have outside assist to stand.                   ADL Overall ADL's : Needs assistance/impaired     Grooming: Moderate assistance;Standing;Wash/dry face Grooming Details (indicate cue type and reason): at sink pt stood with S to mod assist.  Pt very unsafe on her feet and required a great amount of cues to stay on task.             Lower Body Dressing: Maximal assistance;Sit to/from stand Lower Body Dressing Details (indicate cue type and reason): Pt needed mod assist to doff and donn socks. pt could cross legs and get to feet but got too disracted to ever finish the task. Toilet Transfer: Moderate assistance;Ambulation;Regular Teacher, adult education Details (indicate cue type and reason): Pt walked with +1 assist to BR(ademently refusing to use BSC) and got on and off low toilet with mod assist.  Suggest putting 3:1 over commode. Toileting- Clothing Manipulation and Hygiene: Cueing for sequencing;Minimal assistance;Sitting/lateral lean Toileting - Clothing Manipulation Details (indicate cue type and reason): Pt did wipe self in sitting when toilet paper put in her hand but could not get toilet paper and do this on command.  pt understood once toilet paper put in hand.  pt assisted with pulling  up depends.   Tub/Shower Transfer Details (indicate cue type and reason): did not occur, safety concern  Functional mobility during ADLs: Moderate assistance;Cueing for safety General ADL Comments: Pt limited due to poor mental status.  pt unable to follow commands and has no guage of safey needs.      Vision                     Perception     Praxis      Cognition    Behavior During Therapy: Agitated;Restless Overall Cognitive Status: Impaired/Different from baseline Area of Impairment: Orientation;Attention;Memory;Following commands;Safety/judgement;Awareness;Problem solving Orientation Level: Disoriented to;Place;Time;Situation Current Attention Level: Focused Memory: Decreased short-term memory  Following Commands: Follows one step commands inconsistently Safety/Judgement: Decreased awareness of safety;Decreased awareness of deficits Awareness: Intellectual Problem Solving: Slow processing;Decreased initiation;Difficulty sequencing;Requires verbal cues;Requires tactile cues General Comments: pt overall unable to follow many commands and  becomes very agitated if things are not done her way.  Pt only oriented to self.      Extremity/Trunk Assessment               Exercises     Shoulder Instructions       General Comments      Pertinent Vitals/ Pain       Pain Assessment: No/denies pain Faces Pain Scale: No hurt  Home Living Family/patient expects to be discharged to:: Private residence Living Arrangements: Alone Available Help at Discharge: Family;Available 24 hours/day Type of Home: House Home Access: Stairs to enter                                Prior Functioning/Environment              Frequency Min 2X/week     Progress Toward Goals  OT Goals(current goals can now be found in the care plan section)  Progress towards OT goals: Progressing toward goals  Acute Rehab OT Goals Patient Stated Goal: none stated  OT Goal Formulation: Patient unable to participate in goal setting Time For Goal Achievement: 06/28/15 Potential to Achieve Goals: Good ADL Goals Pt Will Perform Grooming: with min assist;standing Pt Will Perform Lower Body Bathing: with min assist;sit to/from stand Pt Will Perform Lower Body Dressing: with min assist;sit to/from stand Pt Will Transfer to Toilet: with min  assist;ambulating;bedside commode Additional ADL Goal #1: Pt will follow one step commands consistently independently  Additional ADL Goal #2: Pt will perform sit to/from stands with supervision using RW  Plan Discharge plan remains appropriate    Co-evaluation                 End of Session Equipment Utilized During Treatment: Gait belt   Activity Tolerance Patient limited by lethargy   Patient Left Other (comment) (on way to test/procedure in bed.)   Nurse Communication Mobility status        Time: [id number] OT Time Calculation (min): 26 min  Charges: OT General Charges $OT Visit: 1 Procedure OT Treatments $Self Care/Home Management : 23-37 mins  Hope BuddsJones, Neida Ellegood Anne 06/15/2015, 12:37 PM  [phone]

## 2015-06-16 ENCOUNTER — Inpatient Hospital Stay (HOSPITAL_COMMUNITY): Payer: Medicare Other

## 2015-06-16 ENCOUNTER — Inpatient Hospital Stay (HOSPITAL_COMMUNITY)
Admit: 2015-06-16 | Discharge: 2015-06-16 | Disposition: A | Discharge status: Home / Self Care | Payer: Medicare Other | Attending: Neurology | Admitting: Neurology

## 2015-06-16 DIAGNOSIS — Z515 Encounter for palliative care: Secondary | ICD-10-CM

## 2015-06-16 DIAGNOSIS — R569 Unspecified convulsions: Secondary | ICD-10-CM

## 2015-06-16 DIAGNOSIS — G40909 Epilepsy, unspecified, not intractable, without status epilepticus: Secondary | ICD-10-CM | POA: Diagnosis present

## 2015-06-16 DIAGNOSIS — J9602 Acute respiratory failure with hypercapnia: Secondary | ICD-10-CM

## 2015-06-16 DIAGNOSIS — G9340 Encephalopathy, unspecified: Secondary | ICD-10-CM | POA: Diagnosis present

## 2015-06-16 DIAGNOSIS — I6320 Cerebral infarction due to unspecified occlusion or stenosis of unspecified precerebral arteries: Secondary | ICD-10-CM

## 2015-06-16 DIAGNOSIS — K7460 Unspecified cirrhosis of liver: Secondary | ICD-10-CM

## 2015-06-16 DIAGNOSIS — G40301 Generalized idiopathic epilepsy and epileptic syndromes, not intractable, with status epilepticus: Secondary | ICD-10-CM

## 2015-06-16 LAB — BLOOD GAS, ARTERIAL
ACID-BASE DEFICIT: 4.5 mmol/L — AB (ref 0.0–2.0)
BICARBONATE: 23.1 meq/L (ref 20.0–24.0)
DRAWN BY: 281201
FIO2: 1
O2 SAT: 99.3 %
PH ART: 7.159 — AB (ref 7.350–7.450)
PO2 ART: 249 mmHg — AB (ref 80.0–100.0)
Patient temperature: 98.6
TCO2: 25.1 mmol/L (ref 0–100)
pCO2 arterial: 67.9 mmHg (ref 35.0–45.0)

## 2015-06-16 LAB — CBC
HCT: 35.8 % — ABNORMAL LOW (ref 36.0–46.0)
Hemoglobin: 12.1 g/dL (ref 12.0–15.0)
MCH: 27 pg (ref 26.0–34.0)
MCHC: 33.8 g/dL (ref 30.0–36.0)
MCV: 79.9 fL (ref 78.0–100.0)
PLATELETS: 81 10*3/uL — AB (ref 150–400)
RBC: 4.48 MIL/uL (ref 3.87–5.11)
RDW: 25.5 % — AB (ref 11.5–15.5)
WBC: 2.9 10*3/uL — AB (ref 4.0–10.5)

## 2015-06-16 LAB — BASIC METABOLIC PANEL
ANION GAP: 11 (ref 5–15)
BUN: 16 mg/dL (ref 6–20)
CALCIUM: 8.7 mg/dL — AB (ref 8.9–10.3)
CO2: 23 mmol/L (ref 22–32)
CREATININE: 0.84 mg/dL (ref 0.44–1.00)
Chloride: 102 mmol/L (ref 101–111)
GFR, EST NON AFRICAN AMERICAN: 59 mL/min — AB (ref >60)
Glucose, Bld: 78 mg/dL (ref 65–99)
Potassium: 3.5 mmol/L (ref 3.5–5.1)
Sodium: 136 mmol/L (ref 135–145)

## 2015-06-16 LAB — AMMONIA: AMMONIA: 13 umol/L (ref 9–35)

## 2015-06-16 LAB — GLUCOSE, CAPILLARY: Glucose-Capillary: 120 mg/dL — ABNORMAL HIGH (ref 65–99)

## 2015-06-16 MED ORDER — LORAZEPAM 2 MG/ML IJ SOLN
1.0000 mg | INTRAMUSCULAR | Status: DC | PRN
  Administered 2015-06-16: 1 mg via INTRAVENOUS
  Filled 2015-06-16: qty 1

## 2015-06-16 MED ORDER — LORAZEPAM 2 MG/ML IJ SOLN
1.0000 mg | Freq: Once | INTRAMUSCULAR | Status: AC
  Administered 2015-06-16: 1 mg via INTRAVENOUS

## 2015-06-16 MED ORDER — SODIUM CHLORIDE 0.9 % IV SOLN
1.0000 mg/h | INTRAVENOUS | Status: DC
  Administered 2015-06-16: 1 mg/h via INTRAVENOUS
  Filled 2015-06-16 (×2): qty 10

## 2015-06-16 MED ORDER — MORPHINE SULFATE (PF) 2 MG/ML IV SOLN
2.0000 mg | INTRAVENOUS | Status: DC | PRN
  Administered 2015-06-16 – 2015-06-17 (×6): 2 mg via INTRAVENOUS
  Filled 2015-06-16 (×6): qty 1

## 2015-06-16 MED ORDER — SODIUM CHLORIDE 0.9 % IV SOLN
500.0000 mg | INTRAVENOUS | Status: AC
  Administered 2015-06-16: 500 mg via INTRAVENOUS
  Filled 2015-06-16: qty 5

## 2015-06-16 MED ORDER — MORPHINE SULFATE (PF) 2 MG/ML IV SOLN
2.0000 mg | INTRAVENOUS | Status: DC | PRN
  Administered 2015-06-16: 2 mg via INTRAVENOUS
  Filled 2015-06-16: qty 1

## 2015-06-16 MED ORDER — VALPROATE SODIUM 500 MG/5ML IV SOLN
500.0000 mg | Freq: Two times a day (BID) | INTRAVENOUS | Status: DC
  Filled 2015-06-16 (×2): qty 5

## 2015-06-16 MED ORDER — LEVETIRACETAM 500 MG PO TABS: 500.0000 mg | ORAL_TABLET | Freq: Two times a day (BID) | ORAL | Status: DC

## 2015-06-16 MED ORDER — MIDAZOLAM BOLUS VIA INFUSION
4.0000 mg | Freq: Once | INTRAVENOUS | Status: AC
  Administered 2015-06-16: 4 mg via INTRAVENOUS
  Filled 2015-06-16: qty 4

## 2015-06-16 MED ORDER — SODIUM CHLORIDE 0.9 % IV SOLN
500.0000 mg | Freq: Two times a day (BID) | INTRAVENOUS | Status: DC
  Administered 2015-06-16 – 2015-06-17 (×2): 500 mg via INTRAVENOUS
  Filled 2015-06-16 (×4): qty 5

## 2015-06-16 MED ORDER — LORAZEPAM 2 MG/ML IJ SOLN
INTRAMUSCULAR | Status: AC
  Administered 2015-06-16: 1 mg
  Filled 2015-06-16: qty 1

## 2015-06-16 MED ORDER — VALPROATE SODIUM 500 MG/5ML IV SOLN
1000.0000 mg | INTRAVENOUS | Status: AC
  Administered 2015-06-16: 1000 mg via INTRAVENOUS
  Filled 2015-06-16: qty 10

## 2015-06-16 MED ORDER — HALOPERIDOL LACTATE 2 MG/ML PO CONC
0.5000 mg | ORAL | Status: DC | PRN
  Filled 2015-06-16: qty 0.3

## 2015-06-16 MED ORDER — FLUMAZENIL 0.5 MG/5ML IV SOLN
0.2500 mg | Freq: Once | INTRAVENOUS | Status: AC
  Administered 2015-06-16: 0.25 mg via INTRAVENOUS
  Filled 2015-06-16: qty 5

## 2015-06-16 MED ORDER — HALOPERIDOL 1 MG PO TABS
0.5000 mg | ORAL_TABLET | ORAL | Status: DC | PRN
Start: 2015-06-16 — End: 2015-06-18

## 2015-06-16 MED ORDER — GLYCOPYRROLATE 1 MG PO TABS
1.0000 mg | ORAL_TABLET | ORAL | Status: DC | PRN
  Filled 2015-06-16: qty 1

## 2015-06-16 MED ORDER — HALOPERIDOL LACTATE 5 MG/ML IJ SOLN: 0.5000 mg | INTRAMUSCULAR | Status: DC | PRN

## 2015-06-16 MED ORDER — GLYCOPYRROLATE 0.2 MG/ML IJ SOLN
0.2000 mg | INTRAMUSCULAR | Status: DC | PRN
  Filled 2015-06-16: qty 1

## 2015-06-16 NOTE — Progress Notes (Signed)
[facility] Progress Note  Patient: Anne Hanna:[id number]   PCP: [name], MD DOB: 02-10-1923   DOA: 16-Jun-2015   DOS: 06/16/2015   Date of Service: the patient was seen and examined on 06/16/2015  Subjective: The patient had overnight episodes of seizures requiring multiple doses of Ativan. In the morning the patient has been stuporous and not awake. No purposeful movement or no following command as well. Later on as per the family the patient also had one more episode of seizure. Nutrition: Nothing by mouth at present  Brief hospital course: Patient was admitted on June 16, 2015, with complaint of confusion, was found to have acute encephalopathy. Initially the acute encephalopathy was thought to be metabolically to UTI and hepatitic encephalopathy as a possibility. Her MRI was negative for any acute ischemia but it did show chronic left subdural hematoma. Patient was also growing Escherichia coli and gram-positive cocci in her urine and therefore antibiotic ceftriaxone was continued. Metabolic panel was otherwise unremarkable. Neurology was consulted. Patient was found to be having seizure-like activity late night on 06/15/2015 with persistent postictal state after that. The patient was given antiepileptic medication as well as IV Ativan for breakthrough seizures. With this the patient's mentation progressively worsened and the patient remained obtunded. Her oxygenation also progressively worsened requiring nonrebreather mask. With progressive worsening family at bedside was consulted and it was decided that the patient should be transitioned to comfort care measures Currently further plan is continue comfort care. Expecting in-hospital death.  Assessment and Plan: 1. Acute encephalopathy Likely due to recurrent seizures as well as persistent postictal status with possible postictal coma. Patient was given IV Keppra despite which she continues to have focal seizures. This morning  the patient was found to be having persistent twitching of her eye concerning for a possible focal seizure. A stat EEG was also consistent with post ictal state with interictal discharges. With this neurology discussed with the patient family, if the patient will get any further antiepileptic medications or the lorazepam she is highly likely require intubation. Critical care was also consulted. After discussing with the critical care neurology regarding available options and the family has decided to maintain patient's DO NOT RESUSCITATE status and transition her to comfort protocol. Palliative care was consulted to assist in patient's management. Patient will be started on Versed drip at present for seizure control. Expecting in-hospital death for the patient.  2. Acute hypoxic respiratory failure with hypercarbia. Patient has bilateral rhonchi on examination. Chest x-ray does not show any focal pneumonia but patient is likely to have a possibility of aspiration. Currently requiring nonrebreather. ABG values are not reported although the values have been documented as critical. With this finding and persistent seizure activity it was felt that the patient would more likely be benefit from comfort measures instead of aggressive intervention.  3. coronary artery disease status post CABG. History of essential hypertension. History of CVA. Acute on chronic combined CHF. Ejection fraction of 30-35%. Wall motion abnormality of septal basal inferior wall.  Likely contributing to patient's rapid worsening of her development of her initial seizure activity. Patient is currently transitioned to complete comfort. Nutrition: Currently nothing by mouth, we will transition to comfort feed Advance goals of care discussion: DNR/DNI, complete comfort at present  HPI: As per the H and P dictated on admission, "[name] is a 82 y.o. female with a history of CAD, HTN, temporal hemorrhagic stroke in  2013, chronic thrombocytopenia, chronic liver cirrhosis, recently hospitalized from 3/8-3/10 due to  acute respiratory failure and severe anemia with Fe deficiency requiring transfusion, and possible history of seizures, presenting today with acute mental status changes. She was last well at 5 pm. Daughter found patient unresponsive this morning, and was concerned of left facial droop, which may have improved since. She called EMS. During transport she was not following commands, but was awake. No significant improvement on arrival although is more alert. Per daughter report, no apparent dysuria, frequency, chest pain, worsening shortness of breath or cough, back pain, rash, fevers, cough, headache, neck stiffness. She had increased salivary secretions during MRI today. No recent ETOH or recreational drug. No bleeding issues. No other sick contacts since last hospitalizations. She is compliant with meds, no new medications.  At the ED, CT head was normal. Urine Clear. VSS, A febrile.no apparent signs of infection. Tn 0.57. CBC remarkable for plt 93 at her baseline, no bleeding issues. Sodium is normal, potassium 3.1, creatinine normal at 1.19, glucose 211. LFTs normal. Neuro involved, awaiting recommendations. MRI brain ordered" Procedures: Echocardiogram, carotid Doppler Consultants: Neurology, critical care medicine Antibiotics: Anti-infectives    Start     Dose/Rate Route Frequency Ordered Stop   06/08/2015 2030  cefTRIAXone (ROCEPHIN) 1 g in dextrose 5 % 50 mL IVPB  Status:  Discontinued     1 g 100 mL/hr over 30 Minutes Intravenous Every 24 hours 06/07/2015 1922 06/16/15 1711       Family Communication: family was present at bedside, at the time of interview.  Opportunity was given to ask question and all questions were answered satisfactorily.   Disposition:  Expected discharge date:Apr 17, 2015 Barriers to safe discharge: Expecting in-hospital death  No intake or output data in the 24 hours  ending 06/16/15 1719 There were no vitals filed for this visit.  Objective: Physical Exam: Filed Vitals:   06/16/15 0843 06/16/15 0945 06/16/15 1000 06/16/15 1100  BP: 135/58 161/68 154/68 157/65  Pulse: 95 109 108 109  Temp: 101.4 F (38.6 C)     TempSrc: Axillary     Resp: 24 32 28 18  SpO2: 90% 100% 99% 99%     General: Appear in marked distress, no Rash; Cardiovascular: S1 and S2 Present, no Murmur,  Respiratory: Bilateral Air entry present and bilatreal Crackles, no wheezes Abdomen: Bowel Sound present, Soft Extremities: trace Pedal edema, no calf tenderness Neurology: Obtunded, withdrawing to painful stimuli occasionally  Data Reviewed: CBC:  Recent Labs Lab 06/07/2015 1047 06/10/2015 1049 06/14/15 0637 06/15/15 0308 06/16/15 0300  WBC  --  3.8* 4.8 3.6* 2.9*  NEUTROABS  --  3.2  --   --   --   HGB 13.9 11.9* 11.8* 12.0 12.1  HCT 41.0 37.1 36.3 35.5* 35.8*  MCV  --  81.0 80.1 80.0 79.9  PLT  --  93* 89* 92* 81*   Basic Metabolic Panel:  Recent Labs Lab 05/25/2015 1047 06/08/2015 1049 06/14/15 0637 06/15/15 0308 06/16/15 0300  NA 134* 135 137 136 136  K 3.1* 3.1* 3.1* 3.1* 3.5  CL 97* 99* 101 101 102  CO2  --  21* 24 22 23   GLUCOSE 206* 211* 82 94 78  BUN 21* 19 18 16 16   CREATININE 0.90 1.19* 0.93 0.89 0.84  CALCIUM  --  9.6 9.2 8.9 8.7*   Liver Function Tests:  Recent Labs Lab 05/21/2015 1049 06/14/15 0637  AST 46* 63*  ALT 22 24  ALKPHOS 64 50  BILITOT 0.7 1.2  PROT 6.8 6.0*  ALBUMIN 3.9 3.4*  No results for input(s): LIPASE, AMYLASE in the last 168 hours.  Recent Labs Lab July 09, 2015 1555 06/14/15 1157 06/16/15 0300  AMMONIA 47* 54* 13    Cardiac Enzymes: No results for input(s): CKTOTAL, CKMB, CKMBINDEX, TROPONINI in the last 168 hours.  BNP (last 3 results)  Recent Labs  05/26/15 2211  BNP 1485.7*    CBG:  Recent Labs Lab 06/16/15 0445  GLUCAP 120*    Recent Results (from the past 240 hour(s))  Culture, Urine      Status: None (Preliminary result)   Collection Time: 07-09-15 11:30 AM  Result Value Ref Range Status   Specimen Description URINE, RANDOM  Final   Special Requests ADDED 2120  Final   Culture   Final    >=100,000 COLONIES/mL ESCHERICHIA COLI >=100,000 COLONIES/mL GRAM POSITIVE COCCI    Report Status PENDING  Incomplete   Organism ID, Bacteria ESCHERICHIA COLI  Final      Susceptibility   Escherichia coli - MIC*    AMPICILLIN <=2 SENSITIVE Sensitive     CEFAZOLIN <=4 SENSITIVE Sensitive     CEFTRIAXONE <=1 SENSITIVE Sensitive     CIPROFLOXACIN <=0.25 SENSITIVE Sensitive     GENTAMICIN <=1 SENSITIVE Sensitive     IMIPENEM <=0.25 SENSITIVE Sensitive     NITROFURANTOIN <=16 SENSITIVE Sensitive     TRIMETH/SULFA <=20 SENSITIVE Sensitive     AMPICILLIN/SULBACTAM <=2 SENSITIVE Sensitive     PIP/TAZO <=4 SENSITIVE Sensitive     * >=100,000 COLONIES/mL ESCHERICHIA COLI     Studies: Dg Chest Port 1 View  06/16/2015  CLINICAL DATA:  Shallow breathing, pneumonia EXAM: PORTABLE CHEST 1 VIEW COMPARISON:  06/14/2015 FINDINGS: Prior CABG. Biapical scarring. Heart is borderline in size. Stable right basilar atelectasis. No confluent opacity on the left. No effusions. No acute bony abnormality. IMPRESSION: Stable exam with right basilar atelectasis. Mild cardiomegaly and biapical scarring. Electronically Signed   By: Charlett Nose M.D.   On: 06/16/2015 10:01     Scheduled Meds: . antiseptic oral rinse  7 mL Mouth Rinse q12n4p  . chlorhexidine  15 mL Mouth Rinse BID  . levETIRAcetam  500 mg Intravenous Q12H  . midazolam  4 mg Intravenous Once  . sodium chloride flush  3 mL Intravenous Q12H   Continuous Infusions: . midazolam (VERSED) infusion    . sodium chloride 0.45 % 1,000 mL with potassium chloride 40 mEq infusion 75 mL/hr at 06/16/15 0841   PRN Meds: acetaminophen, bisacodyl, glycopyrrolate **OR** glycopyrrolate **OR** glycopyrrolate, haloperidol **OR** haloperidol **OR** haloperidol  lactate, LORazepam, morphine injection, ondansetron **OR** ondansetron (ZOFRAN) IV  Time spent: 30 minutes  Author: Lynden Oxford, MD [facility] Pager: [phone] 06/16/2015 5:19 PM  If 7PM-7AM, please contact night-coverage at [email or web address], password [email or web address]

## 2015-06-16 NOTE — Progress Notes (Signed)
Call received per Respiratory Therapist at [phone] regarding Patient with witnessed seizure activity less than a minute, including shaking of legs and arms. Pt now unresponsive and agonal breathing. Upon my arrival at 0450 Pt found in bed, unresponsive to deep pain, pupils equal sluggish reactive. Po2 97-100% on 2 LNC, Protecting airway fairly well. BP 178/96   HR 120s. Dr. Amada JupiterKirkpatrick Neurologist called to bedside. Pt slow to wake up but withdraws to pain for MD assessment at 0457. Keppra 500 mg IVPB x 1 ordered and given.  Witnessed seizure at 0500 with tonic seizure lasting about a minute, 1 mg Ativan given and neurologist notified. Seizure stopped. Patient post ictal , again maintaining airway. Family called per RN and updated, en route to hospital. I left bedside for an emergency at 0510, floor RN advised to monitor Patient closely and update Neurologist if seizures begin again. RRT to follow.

## 2015-06-16 NOTE — Consult Note (Signed)
[facility] PCCM CONSULT NOTE  Called to bedside by neuro team to evaluate patient for altered mental status and respiratory failure. Mrs. Anne Hanna is a 82 year old female with PMH of temporal hemorrhagic stroke, possible seizures, cirrhosis, HTN, CAD and thrombocytopenia who presented with AMS 3/26. Neurology workup has been negative for acute stroke. There was concern for metabolic etiology in setting of UTI, possible HCAP, and cirrhosis with increased ammonia level (which has since been corrected). The morning of 3/29 she developed seizure like activity and was given ativan. Afterwards she became obtunded with respiratory depression. It was at this point I was called to bedside. Patient was originally DNR, however, family felt as though if she was seizing and this could be reversed, they would endorse intubation and ICU transfer. STAT EEG was obtained which did not show status epilepticus, flumazenil was administered with minimal response. Myself and Dr. Lavon PaganiniNandigam had long discussions with family, who determined that above all else they value her comfort, and they feel as though she would not be satisfied living if she would be any less independent than she was prior to admission, which is the most likely outcome. They elect to defer aggressive measures.   O: BP 154/68 mmHg  Pulse 108  Temp(Src) 101.4 F (38.6 C) (Axillary)  Resp 32  SpO2 99%  General:  Elderly female in mild resp distress Neuro: Comatose, no cough/gag HEENT:  [place]/AT, PERRL, no JVD Cardiovascular:  RRR no MRG Lungs:  Very shallow, slow respirations. Coarse L>R Abdomen:  Soft, non-distended Musculoskeletal:  No acute deformity Skin:  Intact, MMM  CBC Latest Ref Rng 06/16/2015 06/15/2015 06/14/2015  WBC 4.0 - 10.5 K/uL 2.9(L) 3.6(L) 4.8  Hemoglobin 12.0 - 15.0 g/dL 40.912.1 81.112.0 11.8(L)  Hematocrit 36.0 - 46.0 % 35.8(L) 35.5(L) 36.3  Platelets 150 - 400 K/uL 81(L) 92(L) 89(L)    BMP Latest Ref Rng 06/16/2015 06/15/2015 06/14/2015  Glucose 65  - 99 mg/dL 78 94 82  BUN 6 - 20 mg/dL 16 16 18   Creatinine 0.44 - 1.00 mg/dL 9.140.84 7.820.89 9.560.93  Sodium 135 - 145 mmol/L 136 136 137  Potassium 3.5 - 5.1 mmol/L 3.5 3.1(L) 3.1(L)  Chloride 101 - 111 mmol/L 102 101 101  CO2 22 - 32 mmol/L 23 22 24   Calcium 8.9 - 10.3 mg/dL 2.1(H8.7(L) 8.9 9.2    ABG    Component Value Date/Time   PHART  06/16/2015 0950    CRITICAL RESULT CALLED TO, READ BACK BY AND VERIFIED WITH:   PCO2ART  06/16/2015 0950    CRITICAL RESULT CALLED TO, READ BACK BY AND VERIFIED WITH:   PO2ART 249* 06/16/2015 0950   HCO3 23.1 06/16/2015 0950   TCO2 25.1 06/16/2015 0950   ACIDBASEDEF 4.5* 06/16/2015 0950   O2SAT 99.3 06/16/2015 0950    Ammonia level 13   A/P: Acute encephalopathy - likely multifactorial in setting post ictal, benzodiazepine administration, and metabolic.  Acute hypercarbic respiratory failure secondary to AMS Seizures UTI  Plan: Long discussion with family, no intubation or aggressive care Comfort care approach is most reasonable at this time, per family wishes  PCCM will sign off   Critical care time 45 mins  Joneen RoachPaul Hoffman, ACNP [facility]/Critical Care Pager [phone] or [phone]

## 2015-06-16 NOTE — Consult Note (Signed)
Consultation Note Date: 06/16/2015   Patient Name: Anne Hanna  DOB: 1922/10/29  MRN: [id number]  Age / Sex: 82 y.o., female  PCP: Mayra Neer, MD Referring Physician: Lavina Hamman, MD  Reason for Consultation: Terminal Care  Clinical Assessment/Narrative: [name] is a 82 year old female admitted with confusion thought to be metabolic secondary to UTI or hepatitic encephalopathy.  MRI was negative for any acute ischemia but it did show chronic left subdural hematoma.  Escherichia coli and gram-positive cocci in her urine and on ceftriaxone. She was found to be having seizure-like activity late night on 06/15/2015 with persistent postictal state after that. The patient was given antiepileptic medication as well as IV Ativan for breakthrough seizures. With this the patient's mentation progressively worsened and the patient remained obtunded. Her oxygenation also progressively worsened requiring nonrebreather mask.  With progressive worsening, family decided that the patient should be transitioned to comfort care measures.  Palliative consulted for assistance with end-of-life care.  I met with family including her son and daughters.  They report that their mother has continued to have seizure-like activity despite receiving frequent doses of IV Ativan. They feel like this is the major thing that is contributing to her not being comfortable and find it very distressing. They state that otherwise they feel that she has adequate pain control and has not been having other signs of discomfort. We discussed at length strategies for control of refractive seizures including initiation of continuous infusion of Versed as family does not feel Ativan has been working. Family reports that her comfort is the most important thing moving forward and like to proceed with continuous infusion of midazolam.  SUMMARY OF RECOMMENDATIONS -  Patient with continued focal seizure activity, including during my meeting with family. - We discussed initiation of versed infusion for sedation and control of seizure activity.  Family understands and would like to proceed. - Further addition of comfort meds through EOL order set.  Code Status/Advance Care Planning: DNR    Code Status Orders        Start     Ordered   06/16/15 1708  Do not attempt resuscitation (DNR)   Continuous    Question Answer Comment  In the event of cardiac or respiratory ARREST Do not call a "code blue"   In the event of cardiac or respiratory ARREST Do not perform Intubation, CPR, defibrillation or ACLS   In the event of cardiac or respiratory ARREST Use medication by any route, position, wound care, and other measures to relive pain and suffering. May use oxygen, suction and manual treatment of airway obstruction as needed for comfort.      06/16/15 1707    Code Status History    Date Active Date Inactive Code Status Order ID Comments User Context   05/21/2015  3:13 PM 06/16/2015  5:07 PM DNR [id number]  Rondel Jumbo, PA-C ED   05/26/2015  3:47 PM 05/28/2015  6:35 PM DNR [id number]  Radene Gunning, NP ED   05/26/2015  3:08 PM 05/26/2015  3:47 PM Full Code [id number]  Radene Gunning, NP ED   03/04/2012 10:38 PM 03/11/2012  7:35 PM Full Code [id number]  Doree Fudge, MD ED     Symptom Management:   As above  Palliative Prophylaxis:   Aspiration, Delirium Protocol and Frequent Pain Assessment  Additional Recommendations (Limitations, Scope, Preferences):  Full Comfort Care  Psycho-social/Spiritual:  Support System: Strong Additional Recommendations: Grief/Bereavement Support  Prognosis: Hours - Days.  I anticipate this to be a terminal admission.  Discharge Planning: Anticipated Hospital Death   Chief Complaint/ Primary Diagnoses: Present on Admission:  . Altered mental status . Coronary artery disease . Hypertension .  Hypercholesterolemia . CVA (cerebral vascular accident) ([place]) . Cirrhosis ([place]) . Anemia . Confusion . Acute respiratory failure with hypercapnia ([place]) . Acute encephalopathy . Post-ictal coma ([facility])  I have reviewed the medical record, interviewed the patient and family, and examined the patient. The following aspects are pertinent.  Past Medical History  Diagnosis Date  . Coronary artery disease     STATUS POST CABG  . Hypertension   . Hypercholesterolemia   . CVA (cerebral vascular accident) ([place])     OCULAR CVA  . Hx: UTI (urinary tract infection)   . Glaucoma     History of glaucoma  . Bronchopneumonia     Right lung  . Urinary tract infection     Escherichia coli urinary tract infection  . Acute sinusitis   . Thrombocytopenia ([place]) 2010     platelet count of 96 at discharge (95 on admission)  . Hyperlipidemia   . History of transient ischemic attack   . Hyponatremia     volume depletion - resolved  . Volume overload     History of postoperative volume overload  . Delirium     Postoperative delirium  . Cataract   . CVA (cerebral infarction)     temporal hemorhage  . Gallstones   . Hepatic cyst   . Cirrhosis ([place])     per endo 2015   Social History   Social History  . Marital Status: Widowed    Spouse Name: N/A  . Number of Children: 5  . Years of Education: 16   Occupational History  . business office    Social History Main Topics  . Smoking status: Never Smoker   . Smokeless tobacco: Never Used  . Alcohol Use: No  . Drug Use: No  . Sexual Activity: Not Asked   Other Topics Concern  . None   Social History Narrative   Patient is a widow and lives at home alone.   Patient has 5 children.   Patient is retired.   Patient has a college education.   Patient is right-handed.   Patient drinks two cups of coffee every morning.         Family History  Problem Relation Age of Onset  . Heart failure Mother   . Heart failure Father   . Heart  disease Brother    Scheduled Meds: . antiseptic oral rinse  7 mL Mouth Rinse q12n4p  . chlorhexidine  15 mL Mouth Rinse BID  . levETIRAcetam  500 mg Intravenous Q12H  . sodium chloride flush  3 mL Intravenous Q12H   Continuous Infusions: . midazolam (VERSED) infusion 1 mg/hr (06/16/15 1830)  . sodium chloride 0.45 % 1,000 mL with potassium chloride 40 mEq infusion 75 mL/hr at 06/16/15 0841   PRN Meds:.acetaminophen, bisacodyl, glycopyrrolate **OR** glycopyrrolate **OR** glycopyrrolate, haloperidol **OR** haloperidol **OR** haloperidol lactate, LORazepam, morphine injection, ondansetron **OR** ondansetron (ZOFRAN) IV Medications Prior to Admission:  Prior to Admission medications   Medication Sig Start Date End Date Taking? Authorizing Provider  aspirin 81 MG tablet Take 81 mg by mouth daily.   Yes Historical Provider, MD  ferrous sulfate 325 (65 FE) MG tablet Take 1 tablet (325 mg total) by mouth daily with breakfast. 05/28/15  Yes Bonnielee Haff, MD  hydrochlorothiazide 25 MG tablet Take  25 mg by mouth daily.     Yes Historical Provider, MD  LATANOPROST OP Apply 1 drop to eye daily. One drop in each eye daily.   Yes Historical Provider, MD  levETIRAcetam (KEPPRA) 250 MG tablet Take 1 tablet (250 mg total) by mouth 2 (two) times daily. 11/04/14  Yes Nilda Riggs, NP  loratadine (CLARITIN) 10 MG tablet Take 10 mg by mouth daily.   Yes Historical Provider, MD  nebivolol (BYSTOLIC) 10 MG tablet Take 10 mg by mouth daily.   Yes Historical Provider, MD  omeprazole (PRILOSEC) 20 MG capsule Take 20 mg by mouth daily. Take 1 tab by mouth daily 04/12/15  Yes Historical Provider, MD  pseudoephedrine-guaifenesin (MUCINEX D) 60-600 MG 12 hr tablet Take 1 tablet by mouth daily.   Yes Historical Provider, MD  rosuvastatin (CRESTOR) 10 MG tablet Take 1 tablet (10 mg total) by mouth daily. 02/22/15  Yes Peter M [place], MD  Timolol Maleate (ISTALOL) 0.5 % (DAILY) SOLN Place 1 drop into both eyes daily.    Yes Historical Provider, MD  nitroGLYCERIN (NITROSTAT) 0.4 MG SL tablet Place 1 tablet (0.4 mg total) under the tongue every 5 (five) minutes as needed. 04/12/12   Peter M [place], MD   Allergies  Allergen Reactions  . Dilantin [Phenytoin]   . Amlodipine Swelling  . Lipitor [Atorvastatin Calcium]     HEADACHES    Review of Systems  Unresponsive  Physical Exam  General: In distress Cardiovascular: regular, no Murmur,  Respiratory: Bilateral Air entry with scattered crackles, no wheezes Abdomen: Bowel Sound present, Soft Extremities: trace Pedal edema, no calf tenderness Neurology: Obtunded, withdrawing to painful stimuli occasionally, noted twitching of eyes as well as left upper extremity  Vital Signs: BP 95/37 mmHg  Pulse 63  Temp(Src) 98.3 F (36.8 C) (Axillary)  Resp 16  SpO2 91%  SpO2: SpO2: 91 % O2 Device:SpO2: 91 % O2 Flow Rate: .O2 Flow Rate (L/min): 2 L/min  IO: Intake/output summary: No intake or output data in the 24 hours ending 06/16/15 1924  LBM: Last BM Date: 06/14/15 Baseline Weight:   Most recent weight:        Palliative Assessment/Data:  Flowsheet Rows        Most Recent Value   Intake Tab    Referral Department  Hospitalist   Unit at Time of Referral  Cardiac/Telemetry Unit   Palliative Care Primary Diagnosis  Neurology   Date Notified  06/16/15   Palliative Care Type  New Palliative care   Date of Admission  06/17/2015   Date first seen by Palliative Care  06/16/15   # of days Palliative referral response time  0 Day(s)   # of days IP prior to Palliative referral  3   Clinical Assessment    Palliative Performance Scale Score  10%   Pain Max last 24 hours  Not able to report   Pain Min Last 24 hours  Not able to report   Dyspnea Max Last 24 Hours  Not able to report   Dyspnea Min Last 24 hours  Not able to report   Psychosocial & Spiritual Assessment    Palliative Care Outcomes    Patient/Family meeting held?  Yes   Who was at the meeting?   Patient's family including her son and daughters   Palliative Care Outcomes  Improved non-pain symptom therapy, Changed to focus on comfort      Additional Data Reviewed:  CBC:    Component Value Date/Time  WBC 2.9* 06/16/2015 0300   HGB 12.1 06/16/2015 0300   HCT 35.8* 06/16/2015 0300   PLT 81* 06/16/2015 0300   MCV 79.9 06/16/2015 0300   NEUTROABS 3.2 06/06/2015 1049   LYMPHSABS 0.4* 06/15/2015 1049   MONOABS 0.2 06/06/2015 1049   EOSABS 0.0 05/28/2015 1049   BASOSABS 0.0 06/17/2015 1049   Comprehensive Metabolic Panel:    Component Value Date/Time   NA 136 06/16/2015 0300   K 3.5 06/16/2015 0300   CL 102 06/16/2015 0300   CO2 23 06/16/2015 0300   BUN 16 06/16/2015 0300   CREATININE 0.84 06/16/2015 0300   GLUCOSE 78 06/16/2015 0300   CALCIUM 8.7* 06/16/2015 0300   AST 63* 06/14/2015 0637   ALT 24 06/14/2015 0637   ALKPHOS 50 06/14/2015 0637   BILITOT 1.2 06/14/2015 0637   PROT 6.0* 06/14/2015 0637   ALBUMIN 3.4* 06/14/2015 7824     Time In: 1625 Time Out: 1745 Time Total: 80 Greater than 50%  of this time was spent counseling and coordinating care related to the above assessment and plan.  Signed by: Micheline Rough, MD  Micheline Rough, MD  06/16/2015, 7:24 PM  Please contact Palliative Medicine Team phone at [phone] for questions and concerns.

## 2015-06-16 NOTE — Progress Notes (Signed)
Called to patient's room by family because patient had another seizure.  Upon arrival to the room pt was not actively seizing.  Family stated full body was moving and it lasted about 1.5 minutes.  They noted that this was her second one since 2 pm.  1 mg ativan given at 1450. Will continue to monitor. Sondra ComeSilva, Azyriah Nevins M, RN

## 2015-06-16 NOTE — Progress Notes (Signed)
Interval History:                                                                                                                      MISTINA COATNEY is an 82 y.o. female patient with  history of temporal hemorrhagic stroke, possible seizures, cirrhosis, HTN, CAD and thrombocytopenia who presented with AMS. She was recently hospitalized for acute respiratory failure. Family states she was confused at that time as well. Yesterday she was last seen to be normal at 5 PM. Her daughter subsequently found her unresponsive this AM, with concern for left facial droop. During transport by EMS, she was awake, but not following commands. She did have any fever, chest pain, cough, headache, neck stiffness or worsened SOB per daughter. Also no EtOH or drug abuse. CT head in the ED was unremarkable and urine was clear.   MRI brain was limited but showed no acute infarct. This AM she as noted to have multiple seizures and received 500 mg bolus of Keppra and another 500 mg Keppra at 0700 along with 3 mg Ativan. On rounding she was noted to have O2 saturations with 4L [place] and had accesory muscles with breathing. Bilateral eyes continued to show rhythmic twitching concerning for status. EEG was called but could not come for 30 minutes. Patient is DNR and a long discussion was had with family concerning adding another agent to stop her seizures but she may need to be intubated temporarily. Family felt this would be ol. She is allergic to Dilantin thus 1 Gram Depakote was ordered. Repeat CXR was also ordered.   Long discussion was made after patient was given flumazenil to reverse the effects of Ativan and her EEG showed PLEDS. Family finally decided on comfort and palliative care.    Past Medical History: Past Medical History  Diagnosis Date  . Coronary artery disease     STATUS POST CABG  . Hypertension   . Hypercholesterolemia   . CVA (cerebral vascular accident) ([facility])     OCULAR CVA  . Hx: UTI (urinary tract  infection)   . Glaucoma     History of glaucoma  . Bronchopneumonia     Right lung  . Urinary tract infection     Escherichia coli urinary tract infection  . Acute sinusitis   . Thrombocytopenia ([facility]) 2010     platelet count of 96 at discharge (95 on admission)  . Hyperlipidemia   . History of transient ischemic attack   . Hyponatremia     volume depletion - resolved  . Volume overload     History of postoperative volume overload  . Delirium     Postoperative delirium  . Cataract   . CVA (cerebral infarction)     temporal hemorhage  . Gallstones   . Hepatic cyst   . Cirrhosis ([facility])     per endo 2015    Past Surgical History  Procedure Laterality Date  . Cardiac catheterization  10/05/2006    NORMAL LEFT  VENTRICULAR SIZE. THERE IS MODERATE GLOBAL HYPOKINESIA WITH OVERALL EF 40%  . Coronary artery bypass graft  09/2006    LIMA GRAFT TO THE LAD, SAPHENOUS VEIN GRAFT TO THE FIRST OBTUSE MARGINAL VESSEL WITH SEQUENTIAL GRAFT TO THE SECOND MARGINAL VESSEL , AND SAPHENOUS VEIN GRAFT TO THE PDA  . Cataract surgery      Bilateral cataract extraction-- x2  . Tendon repair      Status post repair of tendon of the left index finger  . Esophagogastroduodenoscopy N/A 04/22/2013    Procedure: ESOPHAGOGASTRODUODENOSCOPY (EGD);  Surgeon: Willis Modena, MD;  Location[facility] ENDOSCOPY;  Service: Endoscopy;  Laterality: N/A;    Family History: Family History  Problem Relation Age of Onset  . Heart failure Mother   . Heart failure Father   . Heart disease Brother     Social History:   reports that she has never smoked. She has never used smokeless tobacco. She reports that she does not drink alcohol or use illicit drugs.  Allergies:  Allergies  Allergen Reactions  . Dilantin [Phenytoin]   . Amlodipine Swelling  . Lipitor [Atorvastatin Calcium]     HEADACHES     Medications:                                                                                                                          Current facility-administered medications:  .  acetaminophen (TYLENOL) suppository 650 mg, 650 mg, Rectal, Q4H PRN, Roma Kayser Schorr, NP .  antiseptic oral rinse (CPC / CETYLPYRIDINIUM CHLORIDE 0.05%) solution 7 mL, 7 mL, Mouth Rinse, q12n4p, Nishant Dhungel, MD, 7 mL at 06/15/15 1600 .  bisacodyl (DULCOLAX) suppository 10 mg, 10 mg, Rectal, Daily PRN, Marcos Eke, PA-C .  cefTRIAXone (ROCEPHIN) 1 g in dextrose 5 % 50 mL IVPB, 1 g, Intravenous, Q24H, Alberteen Sam, MD, 1 g at 06/15/15 2208 .  chlorhexidine (PERIDEX) 0.12 % solution 15 mL, 15 mL, Mouth Rinse, BID, Nishant Dhungel, MD, 15 mL at 06/15/15 2210 .  haloperidol lactate (HALDOL) injection 1 mg, 1 mg, Intravenous, Q6H PRN, Nishant Dhungel, MD, 1 mg at 06/15/15 2206 .  levETIRAcetam (KEPPRA) 500 mg in sodium chloride 0.9 % 100 mL IVPB, 500 mg, Intravenous, Q12H, Rejeana Brock, MD, 500 mg at 06/16/15 0714 .  morphine 2 MG/ML injection 1 mg, 1 mg, Intravenous, Q4H PRN, Marcos Eke, PA-C .  ondansetron (ZOFRAN) tablet 4 mg, 4 mg, Oral, Q6H PRN **OR** ondansetron (ZOFRAN) injection 4 mg, 4 mg, Intravenous, Q6H PRN, Marcos Eke, PA-C .  sodium chloride 0.45 % 1,000 mL with potassium chloride 40 mEq infusion, , Intravenous, Continuous, Nishant Dhungel, MD, Last Rate: 75 mL/hr at 06/16/15 0841 .  sodium chloride flush (NS) 0.9 % injection 3 mL, 3 mL, Intravenous, Q12H, Sung Amabile Wertman, PA-C, 3 mL at 06/14/15 1045 .  valproate (DEPACON) 1,000 mg in dextrose 5 % 50 mL IVPB, 1,000 mg, Intravenous, STAT, Ulice Dash, PA-C .  valproate (DEPACON) 500 mg in dextrose 5 % 50 mL IVPB, 500 mg, Intravenous, Q12H, Ulice Dash, PA-C   Neurologic Examination:                                                                                                     Today's Vitals   06/16/15 0441 06/16/15 0453 06/16/15 0600 06/16/15 0843  BP: 178/96 134/71  135/58  Pulse: 124 104  95  Temp:  97.4 F (36.3 C)  101.4 F (38.6  C)  TempSrc:  Axillary  Axillary  Resp: SpO2: 97% 100%  90%  PainSc: 0-No pain  0-No pain     Evaluation of higher integrative functions including: Level of alertness: obtunded with no response to sternal rub  Oriented to time, place and person Speech: not able to obtain,   Test the following cranial nerves: PERRLA, rhythmic twitching of bilateral eyes concerning for seizure. No blink to threat. Shallow breathing with Saturation 86% on 4L [place].  Motor examination: flaccid throughout Examination of sensation : no withdrawal form pain Examination of deep tendon reflexes: 1+, throughout  Lab Results: Basic Metabolic Panel:  Recent Labs Lab 06/26/2015 1047  26-Jun-2015 1049 06/14/15 0637 06/15/15 0308 06/16/15 0300  NA 134*  --  135 137 136 136  K 3.1*  --  3.1* 3.1* 3.1* 3.5  CL 97*  --  99* 101 101 102  CO2  --   --  21* GLUCOSE 206*  --  211* 82 94 78  BUN 21*  --  CREATININE 0.90  --  1.19* 0.93 0.89 0.84  CALCIUM  --   < > 9.6 9.2 8.9 8.7*  < > = values in this interval not displayed.  Liver Function Tests:  Recent Labs Lab 2015-06-26 1049 06/14/15 0637  AST 46* 63*  ALT 22 24  ALKPHOS 64 50  BILITOT 0.7 1.2  PROT 6.8 6.0*  ALBUMIN 3.9 3.4*   No results for input(s): LIPASE, AMYLASE in the last 168 hours.  Recent Labs Lab June 26, 2015 1555 06/14/15 1157 06/16/15 0300  AMMONIA 47* 54* 13    CBC:  Recent Labs Lab 2015/06/26 1047 26-Jun-2015 1049 06/14/15 0637 06/15/15 0308 06/16/15 0300  WBC  --  3.8* 4.8 3.6* 2.9*  NEUTROABS  --  3.2  --   --   --   HGB 13.9 11.9* 11.8* 12.0 12.1  HCT 41.0 37.1 36.3 35.5* 35.8*  MCV  --  81.0 80.1 80.0 79.9  PLT  --  93* 89* 92* 81*    Cardiac Enzymes: No results for input(s): CKTOTAL, CKMB, CKMBINDEX, TROPONINI in the last 168 hours.  Lipid Panel:  Recent Labs Lab 06/14/15 0828  CHOL 135  TRIG 59  HDL 65  CHOLHDL 2.1  VLDL 12  LDLCALC 58    CBG:  Recent Labs Lab  06/16/15 0445  GLUCAP 120*    Microbiology: Results for orders placed or performed during the hospital encounter of 26-Jun-2015  Culture, Urine  Status: None (Preliminary result)   Collection Time: 06/11/2015 11:30 AM  Result Value Ref Range Status   Specimen Description URINE, RANDOM  Final   Special Requests ADDED 2120  Final   Culture   Final    >=100,000 COLONIES/mL ESCHERICHIA COLI >=100,000 COLONIES/mL GRAM POSITIVE COCCI    Report Status PENDING  Incomplete    Imaging: Ct Head Wo Contrast  05/29/2015  CLINICAL DATA:  Altered mental status. Unequal pupils. Left-sided facial droop. EXAM: CT HEAD WITHOUT CONTRAST TECHNIQUE: Contiguous axial images were obtained from the base of the skull through the vertex without intravenous contrast. COMPARISON:  03/04/2012; brain MRI- 03/05/2012 FINDINGS: Examination is degraded due to patient motion artifact and patient's inability to cooperate with instructions. Re- demonstrated advanced atrophy with sulcal prominence centralized volume loss with commensurate ex vacuo dilatation of the ventricular system. Extensive periventricular hypodensities compatible with microvascular ischemic disease. Given background parenchymal abnormalities and patient motion, there is no definitive since CT evidence of acute superimposed large territory infarct. No intraparenchymal or extra-axial mass or hemorrhage. Unchanged size and configuration of the ventricles and basilar cisterns. Mild (approximately 6 mm) of left-to-right midline shift is unchanged since the 02/2012 examination and likely attributable to asymmetric atrophy. Intracranial atherosclerosis. Limited visualization of the paranasal sinuses and mastoid air cells is normal. No air-fluid levels. Regional soft tissues appear normal. Post bilateral cataract surgery. No definitive displaced calvarial fracture. IMPRESSION: Similar findings of advanced atrophy and microvascular ischemic disease without definitive  superimposed acute large territory infarct on this motion degraded examination. Electronically Signed   By: Simonne ComeJohn  Watts M.D.   On: 06/15/2015 12:51   Dg Chest Port 1 View  06/14/2015  CLINICAL DATA:  Fever for 1 day, history coronary artery disease, stroke, cirrhosis, hypertension EXAM: PORTABLE CHEST 1 VIEW COMPARISON:  Portable exam 1652 hours compared to 05/26/2015 FINDINGS: Enlargement of cardiac silhouette post CABG. Mediastinal contours and pulmonary vascularity normal. Atherosclerotic calcification and elongation of thoracic aorta. Pleuroparenchymal opacities at BILATERAL lung apices LEFT greater than RIGHT unchanged, likely postinflammatory scarring. Eventration of RIGHT diaphragm unchanged. No definite acute infiltrate, pleural effusion or pneumothorax. Minimal RIGHT basilar atelectasis. Bones demineralized with thoracolumbar scoliosis. IMPRESSION: Enlargement of cardiac silhouette post CABG. Biapical scarring and minimal RIGHT basilar atelectasis. Electronically Signed   By: Ulyses SouthwardMark  Boles M.D.   On: 06/14/2015 17:33   Mr Brain Ltd W/o Cm  06/14/2015  CLINICAL DATA:  Altered mental status, unresponsive this morning. LEFT facial droop. History of stroke, seizures, cirrhosis, hypertension and thrombocytopenia. EXAM: MRI HEAD WITHOUT CONTRAST TECHNIQUE: Axial and coronal diffusion weighted imaging of the brain and surrounding structures were obtained without intravenous contrast. COMPARISON:  CT head June 13, 2015 and MRI of the brain March 05, 2012 FINDINGS: Per technologist note was unable to tolerate further imaging, on sedation. No reduced diffusion to suggest acute ischemia. Trace likely chronic LEFT holohemispheric subdural hematoma though new from prior MRI. Moderate to severe ventriculomegaly on the basis of global parenchymal brain volume loss. IMPRESSION: Limited diffusion weighted imaging MRI of the brain: No acute ischemia. Trace, likely chronic LEFT subdural hematoma. Electronically Signed    By: Awilda Metroourtnay  Bloomer M.D.   On: 06/14/2015 05:34    Assessment and plan:   Wynelle BeckmannMary F Rockers is an 82 y.o. female patient with AMS and continued seizure activity this AM.

## 2015-06-16 NOTE — Significant Event (Signed)
Rapid Response Event Note  Overview: Time Called: 0926 Arrival Time: 0928 Event Type: Neurologic, Respiratory  Initial Focused Assessment: Patient with seizure activity overnight.  Treated with Keppra and Ativan.  This am she is again having focal seizure activity per [facility].  Patient with DNR satus  Family at bedside Patient is obtunded with no gag reflex and minimal cough. Lung sounds with rhionchi, heart tones regular.   BP 161/68  ST 108  RR 26-32  O2 sats 90% on 2L, Temp 101.4 axillary  .    Interventions: Placed on 100% NRB Depakote given  IV ABG done PCXR done ABG pH 7.15/ pCO2 67/ pO2 249/ Bicarb 23 1020 Paul at bedside assessing patient and speaking with family. EEG in progress Dr Lavon PaganiniNandigam at bedside 0.25 Romazicon given IV,  Patient with seizure like activity. Then became obtunded again. Family conference regarding goals of care Decision made for comfort care. RN to call if assistance needed.    Event Summary: Name of Physician Notified: Felicie Mornavid Smith PA at bedside at    Name of Consulting Physician Notified: CCM/Paul Hoffman at 1020  Outcome: Code status clarified  Event End Time: 1130  Marcellina MillinLayton, Allaina Brotzman

## 2015-06-16 NOTE — Clinical Social Work Note (Signed)
[place] [facility] obtained: [phone] Vaughan Browner  Suzie Vandam, MSW, LCSWA [phone] 06/16/2015 9:11 AM

## 2015-06-16 NOTE — Progress Notes (Signed)
PT Cancellation Note  Patient Details Name: Anne BeckmannMary F Tierney MRN: [id number] DOB: 06/12/1922   Cancelled Treatment:    Reason Eval/Treat Not Completed: Medical issues which prohibited therapy, noted pt with seizure at 1500. Hold therapy today. Will check on tomorrow.    Giomar Gusler, [place] 06/16/2015, 3:34 PM

## 2015-06-16 NOTE — Progress Notes (Signed)
Pt currently sleeping. Jaw twitching has ceased after ativan given at 7 am.  Family at bedside.  Will continue to monitor. Sondra ComeSilva, Remberto Lienhard M, RN

## 2015-06-16 NOTE — Progress Notes (Signed)
Portable STAT EEG completed; results pending.

## 2015-06-16 NOTE — Progress Notes (Signed)
SLP Cancellation Note  Patient Details Name: Wynelle BeckmannMary F Cozzens MRN: [id number] DOB: 10/24/1922   Cancelled treatment:       Reason Eval/Treat Not Completed: Medical issues which prohibited therapy. Discussed pt with RN, who says pt remains too lethargic for participation s/p seizure-like activity this morning and dose of ativan. RN to page if pt becomes more alert this afternoon.   Maxcine HamLaura Paiewonsky, M.A. CCC-SLP [phone]  Maxcine Hamaiewonsky, Kinley Ferrentino 06/16/2015, 9:05 AM

## 2015-06-16 NOTE — Progress Notes (Signed)
Pt resting comfortably.  Family at bedside.  Will continue to monitor. Sondra ComeSilva, Amillia Biffle M, RN

## 2015-06-16 NOTE — Progress Notes (Signed)
Called regarding seizure activity. On my iniital assessment around 4:50 am she was withdrawing to noxious stimuli bilaterally with spontaneous voluntary movements. She appeared post ictal but improving. I ordered keppra but prior to this being administered, she had a second seizure which aborted with 1 mg IV ativan. keppra was initiated. Now being called regarding focal facial seizures. I have asked for additional 1mg  IV atvian and will give additional 500mg  IV keppra and continue keppra at 500mg  BID. Also get EEG.   [name] [name], MD [facility] [phone]  If 7pm- 7am, please page neurology on call as listed in AMION.

## 2015-06-16 NOTE — Progress Notes (Signed)
Called by primary RN to assess pt for need for neb tx. No tx's ordered. Per RN, pt's breathing has changed. RT advised to call MD or Rapid Response, and RT would come and assess. Upon arrival to pt's room, pt appeared to be having a seizure. Rapid Response then called by RT. Agonal respirations noted, suctioned small amount of thick, white secretions from pt's mouth. Pt not responsive to sternal rub. HR tachy 140's, O2 sat 98% on 2LNC. RN and Rapid Response nurse at bedside. MD coming to assess pt. RT will continue to monitor.

## 2015-06-17 DIAGNOSIS — R4182 Altered mental status, unspecified: Secondary | ICD-10-CM

## 2015-06-17 LAB — URINE CULTURE

## 2015-06-17 MED ORDER — MORPHINE SULFATE (PF) 2 MG/ML IV SOLN
2.0000 mg | Freq: Four times a day (QID) | INTRAVENOUS | Status: DC
  Administered 2015-06-18: 2 mg via INTRAVENOUS
  Filled 2015-06-17: qty 1

## 2015-06-17 MED ORDER — SODIUM CHLORIDE 0.9 % IV SOLN
1000.0000 mg | Freq: Two times a day (BID) | INTRAVENOUS | Status: DC
  Administered 2015-06-17: 1000 mg via INTRAVENOUS
  Filled 2015-06-17 (×2): qty 10

## 2015-06-17 NOTE — Progress Notes (Signed)
Pt resting.  Family at bedside.  Breathing more labored.  Will continue to monitor. Sondra ComeSilva, Classie Weng M, RN

## 2015-06-17 NOTE — Care Management Important Message (Signed)
Important Message  Patient Details  Name: Anne Hanna MRN: [id number] Date of Birth: 02/23/1923   Medicare Important Message Given:  Yes    Abdulrahman Bracey P Deserai Cansler 06/17/2015, 11:41 AM

## 2015-06-17 NOTE — Progress Notes (Signed)
Interval History:                                                                                                                      Anne Hanna is an 82 y.o. female patient who at this time is End of life care. Family is concerned about intermittent seizure activity. Otherwise happy with plan    Past Medical History: Past Medical History  Diagnosis Date  . Coronary artery disease     STATUS POST CABG  . Hypertension   . Hypercholesterolemia   . CVA (cerebral vascular accident) (HCC)     OCULAR CVA  . Hx: UTI (urinary tract infection)   . Glaucoma     History of glaucoma  . Bronchopneumonia     Right lung  . Urinary tract infection     Escherichia coli urinary tract infection  . Acute sinusitis   . Thrombocytopenia (HCC) 2010     platelet count of 96 at discharge (95 on admission)  . Hyperlipidemia   . History of transient ischemic attack   . Hyponatremia     volume depletion - resolved  . Volume overload     History of postoperative volume overload  . Delirium     Postoperative delirium  . Cataract   . CVA (cerebral infarction)     temporal hemorhage  . Gallstones   . Hepatic cyst   . Cirrhosis (HCC)     per endo 2015    Past Surgical History  Procedure Laterality Date  . Cardiac catheterization  10/05/2006    NORMAL LEFT VENTRICULAR SIZE. THERE IS MODERATE GLOBAL HYPOKINESIA WITH OVERALL EF 40%  . Coronary artery bypass graft  09/2006    LIMA GRAFT TO THE LAD, SAPHENOUS VEIN GRAFT TO THE FIRST OBTUSE MARGINAL VESSEL WITH SEQUENTIAL GRAFT TO THE SECOND MARGINAL VESSEL , AND SAPHENOUS VEIN GRAFT TO THE PDA  . Cataract surgery      Bilateral cataract extraction-- x2  . Tendon repair      Status post repair of tendon of the left index finger  . Esophagogastroduodenoscopy N/A 04/22/2013    Procedure: ESOPHAGOGASTRODUODENOSCOPY (EGD);  Surgeon: Willis Modena, MD;  Location: [facility] ENDOSCOPY;  Service: Endoscopy;  Laterality: N/A;    Family History: Family History   Problem Relation Age of Onset  . Heart failure Mother   . Heart failure Father   . Heart disease Brother     Social History:   reports that she has never smoked. She has never used smokeless tobacco. She reports that she does not drink alcohol or use illicit drugs.  Allergies:  Allergies  Allergen Reactions  . Dilantin [Phenytoin]   . Amlodipine Swelling  . Lipitor [Atorvastatin Calcium]     HEADACHES     Medications:  Current facility-administered medications:  .  acetaminophen (TYLENOL) suppository 650 mg, 650 mg, Rectal, Q4H PRN, Roma Kayser Schorr, NP .  antiseptic oral rinse (CPC / CETYLPYRIDINIUM CHLORIDE 0.05%) solution 7 mL, 7 mL, Mouth Rinse, q12n4p, Nishant Dhungel, MD, 7 mL at 06/16/15 1600 .  bisacodyl (DULCOLAX) suppository 10 mg, 10 mg, Rectal, Daily PRN, Marcos Eke, PA-C .  chlorhexidine (PERIDEX) 0.12 % solution 15 mL, 15 mL, Mouth Rinse, BID, Nishant Dhungel, MD, 15 mL at 06/16/15 2200 .  glycopyrrolate (ROBINUL) tablet 1 mg, 1 mg, Oral, Q4H PRN **OR** glycopyrrolate (ROBINUL) injection 0.2 mg, 0.2 mg, Subcutaneous, Q4H PRN **OR** glycopyrrolate (ROBINUL) injection 0.2 mg, 0.2 mg, Intravenous, Q4H PRN, Romie Minus, MD .  haloperidol (HALDOL) tablet 0.5 mg, 0.5 mg, Oral, Q4H PRN **OR** haloperidol (HALDOL) 2 MG/ML solution 0.5 mg, 0.5 mg, Sublingual, Q4H PRN **OR** haloperidol lactate (HALDOL) injection 0.5 mg, 0.5 mg, Intravenous, Q4H PRN, Romie Minus, MD .  levETIRAcetam (KEPPRA) 1,000 mg in sodium chloride 0.9 % 100 mL IVPB, 1,000 mg, Intravenous, Q12H, Ulice Dash, PA-C .  LORazepam (ATIVAN) injection 1 mg, 1 mg, Intravenous, Q4H PRN, Rolly Salter, MD, 1 mg at 06/16/15 1450 .  midazolam (VERSED) 50 mg in sodium chloride 0.9 % 50 mL (1 mg/mL) infusion, 1 mg/hr, Intravenous, Continuous, Gene Freeman, MD, Last Rate: 1 mL/hr at 06/16/15 1830,  1 mg/hr at 06/16/15 1830 .  morphine 2 MG/ML injection 2-4 mg, 2-4 mg, Intravenous, Q30 min PRN, Romie Minus, MD, 2 mg at 06/17/15 0631 .  ondansetron (ZOFRAN) tablet 4 mg, 4 mg, Oral, Q6H PRN **OR** ondansetron (ZOFRAN) injection 4 mg, 4 mg, Intravenous, Q6H PRN, Marcos Eke, PA-C .  sodium chloride 0.45 % 1,000 mL with potassium chloride 40 mEq infusion, , Intravenous, Continuous, Nishant Dhungel, MD, Last Rate: 75 mL/hr at 06/16/15 0841 .  sodium chloride flush (NS) 0.9 % injection 3 mL, 3 mL, Intravenous, Q12H, Marcos Eke, PA-C, 3 mL at 06/16/15 2200   Neurologic Examination:                                                                                                     Today's Vitals   06/17/15 0102 06/17/15 0215 06/17/15 0500 06/17/15 0553  BP:    111/39  Pulse: 102   98  Temp: 97.8 F (36.6 C)   97.6 F (36.4 C)  TempSrc: Axillary   Axillary  Resp: 14 16 15 17   SpO2: 92%   94%  PainSc:        Evaluation of higher integrative functions including: Resting comfortably. PERRLA face symmetric, flaccid throughout. On versed drip.    Lab Results: Basic Metabolic Panel:  Recent Labs Lab 05/29/2015 1047  05/26/2015 1049 06/14/15 0637 06/15/15 0308 06/16/15 0300  NA 134*  --  135 137 136 136  K 3.1*  --  3.1* 3.1* 3.1* 3.5  CL 97*  --  99* 101 101 102  CO2  --   --  21* 24 22 23   GLUCOSE 206*  --  211* 82 94 78  BUN  21*  --  CREATININE 0.90  --  1.19* 0.93 0.89 0.84  CALCIUM  --   < > 9.6 9.2 8.9 8.7*  < > = values in this interval not displayed.  Liver Function Tests:  Recent Labs Lab 13-Jul-2015 1049 06/14/15 0637  AST 46* 63*  ALT 22 24  ALKPHOS 64 50  BILITOT 0.7 1.2  PROT 6.8 6.0*  ALBUMIN 3.9 3.4*   No results for input(s): LIPASE, AMYLASE in the last 168 hours.  Recent Labs Lab 07/13/15 1555 06/14/15 1157 06/16/15 0300  AMMONIA 47* 54* 13    CBC:  Recent Labs Lab July 13, 2015 1047 07/13/15 1049 06/14/15 0637 06/15/15 0308  06/16/15 0300  WBC  --  3.8* 4.8 3.6* 2.9*  NEUTROABS  --  3.2  --   --   --   HGB 13.9 11.9* 11.8* 12.0 12.1  HCT 41.0 37.1 36.3 35.5* 35.8*  MCV  --  81.0 80.1 80.0 79.9  PLT  --  93* 89* 92* 81*    Cardiac Enzymes: No results for input(s): CKTOTAL, CKMB, CKMBINDEX, TROPONINI in the last 168 hours.  Lipid Panel:  Recent Labs Lab 06/14/15 0828  CHOL 135  TRIG 59  HDL 65  CHOLHDL 2.1  VLDL 12  LDLCALC 58    CBG:  Recent Labs Lab 06/16/15 0445  GLUCAP 120*    Microbiology: Results for orders placed or performed during the hospital encounter of 07-13-15  Culture, Urine     Status: None   Collection Time: 2015/07/13 11:30 AM  Result Value Ref Range Status   Specimen Description URINE, RANDOM  Final   Special Requests ADDED 2120  Final   Culture   Final    >=100,000 COLONIES/mL ESCHERICHIA COLI >=100,000 COLONIES/mL VIRIDANS STREPTOCOCCUS    Report Status 06/17/2015 FINAL  Final   Organism ID, Bacteria ESCHERICHIA COLI  Final      Susceptibility   Escherichia coli - MIC*    AMPICILLIN <=2 SENSITIVE Sensitive     CEFAZOLIN <=4 SENSITIVE Sensitive     CEFTRIAXONE <=1 SENSITIVE Sensitive     CIPROFLOXACIN <=0.25 SENSITIVE Sensitive     GENTAMICIN <=1 SENSITIVE Sensitive     IMIPENEM <=0.25 SENSITIVE Sensitive     NITROFURANTOIN <=16 SENSITIVE Sensitive     TRIMETH/SULFA <=20 SENSITIVE Sensitive     AMPICILLIN/SULBACTAM <=2 SENSITIVE Sensitive     PIP/TAZO <=4 SENSITIVE Sensitive     * >=100,000 COLONIES/mL ESCHERICHIA COLI    Imaging: Ct Head Wo Contrast  13-Jul-2015  CLINICAL DATA:  Altered mental status. Unequal pupils. Left-sided facial droop. EXAM: CT HEAD WITHOUT CONTRAST TECHNIQUE: Contiguous axial images were obtained from the base of the skull through the vertex without intravenous contrast. COMPARISON:  03/04/2012; brain MRI- 03/05/2012 FINDINGS: Examination is degraded due to patient motion artifact and patient's inability to cooperate with  instructions. Re- demonstrated advanced atrophy with sulcal prominence centralized volume loss with commensurate ex vacuo dilatation of the ventricular system. Extensive periventricular hypodensities compatible with microvascular ischemic disease. Given background parenchymal abnormalities and patient motion, there is no definitive since CT evidence of acute superimposed large territory infarct. No intraparenchymal or extra-axial mass or hemorrhage. Unchanged size and configuration of the ventricles and basilar cisterns. Mild (approximately 6 mm) of left-to-right midline shift is unchanged since the 02/2012 examination and likely attributable to asymmetric atrophy. Intracranial atherosclerosis. Limited visualization of the paranasal sinuses and mastoid air cells is normal. No air-fluid levels. Regional soft tissues appear normal.  Post bilateral cataract surgery. No definitive displaced calvarial fracture. IMPRESSION: Similar findings of advanced atrophy and microvascular ischemic disease without definitive superimposed acute large territory infarct on this motion degraded examination. Electronically Signed   By: Simonne ComeJohn  Watts M.D.   On: 05/27/2015 12:51   Dg Chest Port 1 View  06/16/2015  CLINICAL DATA:  Shallow breathing, pneumonia EXAM: PORTABLE CHEST 1 VIEW COMPARISON:  06/14/2015 FINDINGS: Prior CABG. Biapical scarring. Heart is borderline in size. Stable right basilar atelectasis. No confluent opacity on the left. No effusions. No acute bony abnormality. IMPRESSION: Stable exam with right basilar atelectasis. Mild cardiomegaly and biapical scarring. Electronically Signed   By: Charlett NoseKevin  Dover M.D.   On: 06/16/2015 10:01   Dg Chest Port 1 View  06/14/2015  CLINICAL DATA:  Fever for 1 day, history coronary artery disease, stroke, cirrhosis, hypertension EXAM: PORTABLE CHEST 1 VIEW COMPARISON:  Portable exam 1652 hours compared to 05/26/2015 FINDINGS: Enlargement of cardiac silhouette post CABG. Mediastinal  contours and pulmonary vascularity normal. Atherosclerotic calcification and elongation of thoracic aorta. Pleuroparenchymal opacities at BILATERAL lung apices LEFT greater than RIGHT unchanged, likely postinflammatory scarring. Eventration of RIGHT diaphragm unchanged. No definite acute infiltrate, pleural effusion or pneumothorax. Minimal RIGHT basilar atelectasis. Bones demineralized with thoracolumbar scoliosis. IMPRESSION: Enlargement of cardiac silhouette post CABG. Biapical scarring and minimal RIGHT basilar atelectasis. Electronically Signed   By: Ulyses SouthwardMark  Boles M.D.   On: 06/14/2015 17:33   Mr Brain Ltd W/o Cm  06/14/2015  CLINICAL DATA:  Altered mental status, unresponsive this morning. LEFT facial droop. History of stroke, seizures, cirrhosis, hypertension and thrombocytopenia. EXAM: MRI HEAD WITHOUT CONTRAST TECHNIQUE: Axial and coronal diffusion weighted imaging of the brain and surrounding structures were obtained without intravenous contrast. COMPARISON:  CT head June 13, 2015 and MRI of the brain March 05, 2012 FINDINGS: Per technologist note was unable to tolerate further imaging, on sedation. No reduced diffusion to suggest acute ischemia. Trace likely chronic LEFT holohemispheric subdural hematoma though new from prior MRI. Moderate to severe ventriculomegaly on the basis of global parenchymal brain volume loss. IMPRESSION: Limited diffusion weighted imaging MRI of the brain: No acute ischemia. Trace, likely chronic LEFT subdural hematoma. Electronically Signed   By: Awilda Metroourtnay  Bloomer M.D.   On: 06/14/2015 05:34    Assessment and plan:   Anne Hanna is an 82 y.o. female patient with seizures and currently on end of life protocol per palliative. Neurology will S/O

## 2015-06-17 NOTE — Progress Notes (Signed)
[facility] Progress Note  Patient: Anne Hanna ZOX:[id number]   PCP: [name], MD DOB: May 31, 1922   DOA: 07-11-15   DOS: 06/17/2015   Date of Service: the patient was seen and examined on 06/17/2015  Subjective: The patient had overnight episodes of seizures requiring multiple doses of Ativan. In the morning the patient has been stuporous and not awake. No purposeful movement or no following command as well. Later on as per the family the patient also had one more episode of seizure. Nutrition: Nothing by mouth at present  Brief hospital course: Patient was admitted on 07/11/15, with complaint of confusion, was found to have acute encephalopathy. Initially the acute encephalopathy was thought to be metabolically to UTI and hepatitic encephalopathy as a possibility. Her MRI was negative for any acute ischemia but it did show chronic left subdural hematoma. Patient was also growing Escherichia coli and gram-positive cocci in her urine and therefore antibiotic ceftriaxone was continued. Metabolic panel was otherwise unremarkable. Neurology was consulted. Patient was found to be having seizure-like activity late night on 06/15/2015 with persistent postictal state after that. The patient was given antiepileptic medication as well as IV Ativan for breakthrough seizures. With this the patient's mentation progressively worsened and the patient remained obtunded. Her oxygenation also progressively worsened requiring nonrebreather mask. With progressive worsening family at bedside was consulted and it was decided that the patient should be transitioned to comfort care measures Currently further plan is continue comfort care. Expecting in-hospital death.  Assessment and Plan: 1. Acute encephalopathy   Likely due to recurrent seizures as well as persistent postictal status with possible postictal coma. Patient was given IV Keppra despite which she continues to have focal seizures. Patient  continues to have possible focal seizure described by the family A stat EEG was also consistent with post ictal state with interictal discharges. With this neurology discussed with the patient family, if the patient will get any further antiepileptic medications or the lorazepam she is highly likely require intubation. Critical care was also consulted. After discussing with the critical care neurology regarding available options and the family has decided to maintain patient's DO NOT RESUSCITATE status and transition her to comfort protocol. Palliative care was consulted to assist in patient's management. Patient will be started on Versed drip at present for seizure control. Keppra dose will be increased today. Expecting in-hospital death for the patient.  2. Acute hypoxic respiratory failure with hypercarbia. Patient has bilateral rhonchi on examination. Chest x-ray does not show any focal pneumonia but patient is likely to have a possibility of aspiration. Currently requiring nonrebreather. ABG values are not reported although the values have been documented as critical. With this finding and persistent seizure activity it was felt that the patient would more likely be benefit from comfort measures instead of aggressive intervention.  3. coronary artery disease status post CABG. History of essential hypertension. History of CVA. Acute on chronic combined CHF. Ejection fraction of 30-35%. Wall motion abnormality of septal basal inferior wall. Escherichia coli and strep viridans UTI  Likely contributing to patient's rapid worsening of her development of her initial seizure activity. Patient is currently transitioned to complete comfort.  Nutrition: comfort feed Advance goals of care discussion: DNR/DNI, complete comfort at present  HPI: As per the H and P dictated on admission, "Anne Hanna is a 82 y.o. female with a history of CAD, HTN, temporal hemorrhagic stroke in 2013, chronic  thrombocytopenia, chronic liver cirrhosis, recently hospitalized from 3/8-3/10 due to acute respiratory  failure and severe anemia with Fe deficiency requiring transfusion, and possible history of seizures, presenting today with acute mental status changes. She was last well at 5 pm. Daughter found patient unresponsive this morning, and was concerned of left facial droop, which may have improved since. She called EMS. During transport she was not following commands, but was awake. No significant improvement on arrival although is more alert. Per daughter report, no apparent dysuria, frequency, chest pain, worsening shortness of breath or cough, back pain, rash, fevers, cough, headache, neck stiffness. She had increased salivary secretions during MRI today. No recent ETOH or recreational drug. No bleeding issues. No other sick contacts since last hospitalizations. She is compliant with meds, no new medications.  At the ED, CT head was normal. Urine Clear. VSS, A febrile.no apparent signs of infection. Tn 0.57. CBC remarkable for plt 93 at her baseline, no bleeding issues. Sodium is normal, potassium 3.1, creatinine normal at 1.19, glucose 211. LFTs normal. Neuro involved, awaiting recommendations. MRI brain ordered" Procedures: Echocardiogram, carotid Doppler Consultants: Neurology, critical care medicine Antibiotics: Anti-infectives    Start     Dose/Rate Route Frequency Ordered Stop   05-05-15 2030  cefTRIAXone (ROCEPHIN) 1 g in dextrose 5 % 50 mL IVPB  Status:  Discontinued     1 g 100 mL/hr over 30 Minutes Intravenous Every 24 hours 05-05-15 1922 06/16/15 1711      Family Communication: family was present at bedside, at the time of interview.  Opportunity was given to ask question and all questions were answered satisfactorily.   Disposition:  Expected discharge date:05/28/2015 Barriers to safe discharge: Expecting in-hospital death  No intake or output data in the 24 hours ending 06/17/15  1249 There were no vitals filed for this visit.  Objective: Physical Exam: Filed Vitals:   06/17/15 0215 06/17/15 0500 06/17/15 0553 06/17/15 0939  BP:   111/39 140/61  Pulse:   98 110  Temp:   97.6 F (36.4 C) 97.9 F (36.6 C)  TempSrc:   Axillary Axillary  Resp: 16 15 17 20   SpO2:   94% 85%    General: Appear in marked distress, no Rash; Cardiovascular: S1 and S2 Present, no Murmur,  Respiratory: Bilateral Air entry present and bilatreal Crackles, no wheezes Abdomen: Bowel Sound present, Soft Extremities: trace Pedal edema, no calf tenderness Neurology: Obtunded, withdrawing to painful stimuli occasionally  Data Reviewed: CBC:  Recent Labs Lab 05-05-15 1047 05-05-15 1049 06/14/15 0637 06/15/15 0308 06/16/15 0300  WBC  --  3.8* 4.8 3.6* 2.9*  NEUTROABS  --  3.2  --   --   --   HGB 13.9 11.9* 11.8* 12.0 12.1  HCT 41.0 37.1 36.3 35.5* 35.8*  MCV  --  81.0 80.1 80.0 79.9  PLT  --  93* 89* 92* 81*   Basic Metabolic Panel:  Recent Labs Lab 05-05-15 1047 05-05-15 1049 06/14/15 0637 06/15/15 0308 06/16/15 0300  NA 134* 135 137 136 136  K 3.1* 3.1* 3.1* 3.1* 3.5  CL 97* 99* 101 101 102  CO2  --  21* 24 22 23   GLUCOSE 206* 211* 82 94 78  BUN 21* 19 18 16 16   CREATININE 0.90 1.19* 0.93 0.89 0.84  CALCIUM  --  9.6 9.2 8.9 8.7*   Liver Function Tests:  Recent Labs Lab 05-05-15 1049 06/14/15 0637  AST 46* 63*  ALT 22 24  ALKPHOS 64 50  BILITOT 0.7 1.2  PROT 6.8 6.0*  ALBUMIN 3.9 3.4*   No  results for input(s): LIPASE, AMYLASE in the last 168 hours.  Recent Labs Lab 05/23/2015 1555 06/14/15 1157 06/16/15 0300  AMMONIA 47* 54* 13    Cardiac Enzymes: No results for input(s): CKTOTAL, CKMB, CKMBINDEX, TROPONINI in the last 168 hours.  BNP (last 3 results)  Recent Labs  05/26/15 2211  BNP 1485.7*    CBG:  Recent Labs Lab 06/16/15 0445  GLUCAP 120*    Recent Results (from the past 240 hour(s))  Culture, Urine     Status: None    Collection Time: 06/12/2015 11:30 AM  Result Value Ref Range Status   Specimen Description URINE, RANDOM  Final   Special Requests ADDED 2120  Final   Culture   Final    >=100,000 COLONIES/mL ESCHERICHIA COLI >=100,000 COLONIES/mL VIRIDANS STREPTOCOCCUS    Report Status 06/17/2015 FINAL  Final   Organism ID, Bacteria ESCHERICHIA COLI  Final      Susceptibility   Escherichia coli - MIC*    AMPICILLIN <=2 SENSITIVE Sensitive     CEFAZOLIN <=4 SENSITIVE Sensitive     CEFTRIAXONE <=1 SENSITIVE Sensitive     CIPROFLOXACIN <=0.25 SENSITIVE Sensitive     GENTAMICIN <=1 SENSITIVE Sensitive     IMIPENEM <=0.25 SENSITIVE Sensitive     NITROFURANTOIN <=16 SENSITIVE Sensitive     TRIMETH/SULFA <=20 SENSITIVE Sensitive     AMPICILLIN/SULBACTAM <=2 SENSITIVE Sensitive     PIP/TAZO <=4 SENSITIVE Sensitive     * >=100,000 COLONIES/mL ESCHERICHIA COLI     Studies: No results found.   Scheduled Meds: . antiseptic oral rinse  7 mL Mouth Rinse q12n4p  . chlorhexidine  15 mL Mouth Rinse BID  . levETIRAcetam  1,000 mg Intravenous Q12H  . sodium chloride flush  3 mL Intravenous Q12H   Continuous Infusions: . midazolam (VERSED) infusion 1 mg/hr (06/16/15 1830)  . sodium chloride 0.45 % 1,000 mL with potassium chloride 40 mEq infusion 75 mL/hr at 06/16/15 0841   PRN Meds: acetaminophen, bisacodyl, glycopyrrolate **OR** glycopyrrolate **OR** glycopyrrolate, haloperidol **OR** haloperidol **OR** haloperidol lactate, LORazepam, morphine injection, ondansetron **OR** ondansetron (ZOFRAN) IV  Time spent: 30 minutes  Author: Lynden Oxford, MD [facility] Pager: [phone] 06/17/2015 12:49 PM  If 7PM-7AM, please contact night-coverage at [email or web address], password [facility]

## 2015-06-17 NOTE — Progress Notes (Signed)
Nutrition Follow-up   INTERVENTION:  No further interventions warranted at this time; RD signing off   NUTRITION DIAGNOSIS:   Predicted suboptimal nutrient intake related to lethargy/confusion as evidenced by NPO status.  Ongoing  GOAL:   Patient will meet greater than or equal to 90% of their needs  Unmet  MONITOR:   Diet advancement, PO intake, Supplement acceptance, Weight trends, Labs, Skin  REASON FOR ASSESSMENT:   Malnutrition Screening Tool    ASSESSMENT:   82 y.o. female patient with history of temporal hemorrhagic stroke, possible seizures, cirrhosis, HTN, CAD and thrombocytopenia who presented with AMS. She was recently hospitalized for acute respiratory failure.   Pt remains NPO. Family met with palliative care this morning and decision was made for full comfort care. Prognosis of hours to days per palliative note. No nutrition interventions warranted.   Diet Order:  Diet NPO time specified  Skin:  Reviewed, no issues  Last BM:  3/27  Height:   Ht Readings from Last 1 Encounters:  05/27/15 '5\' 5"'$  (1.651 m)    Weight:   Wt Readings from Last 1 Encounters:  05/27/15 127 lb 8.4 oz (57.844 kg)    Ideal Body Weight:  56.8 kg  BMI:  There is no weight on file to calculate BMI.  Estimated Nutritional Needs:   Kcal:  1300-1500  Protein:  68-80 grams  Fluid:  1.3-1.5 L/day  EDUCATION NEEDS:   No education needs identified at this time  [place], LDN Inpatient Clinical Dietitian Pager: [phone] After Hours Pager: [phone]

## 2015-06-17 NOTE — Progress Notes (Signed)
Daily Progress Note   Patient Name: Anne BeckmannMary F Hanna       Date: 06/17/2015 DOB: 04/11/1922  Age: 82 y.o. MRN#: [id number] Attending Physician: Rolly SalterPranav M Patel, MD Primary Care Physician: Lupita RaiderSHAW,KIMBERLEE, MD Admit Date: 05/21/2015  Reason for Consultation/Follow-up: Terminal Care  Subjective: Resting comfortably in bed.  Family at bedside report that she had a good night but noted to have some seizure activity this AM.  Neurology has increased Keppra this AM.    Otherwise, no reported signs of distress.  I spoke with her nurse from overnight shift this morning via phone and she reports patient rested comfortably through the night.  Counseled family this AM on progression of dying process (increase periods apnea, secretions, etc.)  Length of Stay: 3 days  Current Medications: Scheduled Meds:  . antiseptic oral rinse  7 mL Mouth Rinse q12n4p  . chlorhexidine  15 mL Mouth Rinse BID  . levETIRAcetam  1,000 mg Intravenous Q12H  . sodium chloride flush  3 mL Intravenous Q12H    Continuous Infusions: . midazolam (VERSED) infusion 1 mg/hr (06/16/15 1830)  . sodium chloride 0.45 % 1,000 mL with potassium chloride 40 mEq infusion 75 mL/hr at 06/16/15 0841    PRN Meds: acetaminophen, bisacodyl, glycopyrrolate **OR** glycopyrrolate **OR** glycopyrrolate, haloperidol **OR** haloperidol **OR** haloperidol lactate, LORazepam, morphine injection, ondansetron **OR** ondansetron (ZOFRAN) IV  Physical Exam: Physical Exam    General:  Elderly, frail female in bed in no distress Cardiovascular: regular, no Murmur, peripheral pulses present but diminished  Respiratory: Bilateral Air entry with scattered crackles, no wheezes Abdomen: Bowel Sound present, Soft Extremities: trace Pedal edema, no calf  tenderness Neurology:  Unresponsive          Vital Signs: BP 111/39 mmHg  Pulse 98  Temp(Src) 97.6 F (36.4 C) (Axillary)  Resp 17  SpO2 94% SpO2: SpO2: 94 % O2 Device: O2 Device: Nasal Cannula O2 Flow Rate: O2 Flow Rate (L/min): 2 L/min  Intake/output summary: No intake or output data in the 24 hours ending 06/17/15 0931 LBM: Last BM Date: 06/14/15 Baseline Weight:   Most recent weight:         Palliative Assessment/Data: Flowsheet Rows        Most Recent Value   Intake Tab    Referral Department  Hospitalist   Unit at  Time of Referral  Cardiac/Telemetry Unit   Palliative Care Primary Diagnosis  Neurology   Date Notified  06/16/15   Palliative Care Type  New Palliative care   Date of Admission  06/12/2015   Date first seen by Palliative Care  06/16/15   # of days Palliative referral response time  0 Day(s)   # of days IP prior to Palliative referral  3   Clinical Assessment    Palliative Performance Scale Score  10%   Pain Max last 24 hours  Not able to report   Pain Min Last 24 hours  Not able to report   Dyspnea Max Last 24 Hours  Not able to report   Dyspnea Min Last 24 hours  Not able to report   Psychosocial & Spiritual Assessment    Palliative Care Outcomes    Patient/Family meeting held?  Yes   Who was at the meeting?  Patient's family including her son and daughters   Palliative Care Outcomes  Improved non-pain symptom therapy, Changed to focus on comfort      Additional Data Reviewed: CBC    Component Value Date/Time   WBC 2.9* 06/16/2015 0300   RBC 4.48 06/16/2015 0300   RBC 3.11* 05/26/2015 2211   HGB 12.1 06/16/2015 0300   HCT 35.8* 06/16/2015 0300   PLT 81* 06/16/2015 0300   MCV 79.9 06/16/2015 0300   MCH 27.0 06/16/2015 0300   MCHC 33.8 06/16/2015 0300   RDW 25.5* 06/16/2015 0300   LYMPHSABS 0.4* 06/12/2015 1049   MONOABS 0.2 05/29/2015 1049   EOSABS 0.0 06/06/2015 1049   BASOSABS 0.0 06/15/2015 1049    CMP     Component Value Date/Time    NA 136 06/16/2015 0300   K 3.5 06/16/2015 0300   CL 102 06/16/2015 0300   CO2 23 06/16/2015 0300   GLUCOSE 78 06/16/2015 0300   BUN 16 06/16/2015 0300   CREATININE 0.84 06/16/2015 0300   CALCIUM 8.7* 06/16/2015 0300   PROT 6.0* 06/14/2015 0637   ALBUMIN 3.4* 06/14/2015 0637   AST 63* 06/14/2015 0637   ALT 24 06/14/2015 0637   ALKPHOS 50 06/14/2015 0637   BILITOT 1.2 06/14/2015 0637   GFRNONAA 59* 06/16/2015 0300   GFRAA >60 06/16/2015 0300       Problem List:  Patient Active Problem List   Diagnosis Date Noted  . Acute respiratory failure with hypercapnia (HCC)   . Acute encephalopathy   . Post-ictal coma (HCC)   . Seizures (HCC)   . Altered mental status 05/29/2015  . Confusion 06/02/2015  . Elevated troponin 05/26/2015  . Dyspnea 05/26/2015  . Symptomatic anemia 05/26/2015  . Acute kidney injury (HCC) 05/26/2015  . Jaundice 05/26/2015  . Acute respiratory failure with hypoxia (HCC) 05/26/2015  . CAP (community acquired pneumonia) 05/26/2015  . Cirrhosis (HCC)   . Memory loss 08/29/2012  . Other specified visual disturbances 08/29/2012  . UTI (lower urinary tract infection) 03/09/2012  . Cerebral brain hemorrhage (HCC) 03/05/2012  . Unspecified intracranial hemorrhage 03/04/2012  . Seizure (HCC) 03/04/2012  . Anemia 03/04/2012  . Coronary artery disease   . Hypertension   . Hypercholesterolemia   . CVA (cerebral vascular accident) (HCC)   . Hx: UTI (urinary tract infection)   . Glaucoma      Palliative Care Assessment & Plan    1.Code Status:  DNR    Code Status Orders        Start     Ordered  06/16/15 1708  Do not attempt resuscitation (DNR)   Continuous    Question Answer Comment  In the event of cardiac or respiratory ARREST Do not call a "code blue"   In the event of cardiac or respiratory ARREST Do not perform Intubation, CPR, defibrillation or ACLS   In the event of cardiac or respiratory ARREST Use medication by any route, position,  wound care, and other measures to relive pain and suffering. May use oxygen, suction and manual treatment of airway obstruction as needed for comfort.      06/16/15 1707    Code Status History    Date Active Date Inactive Code Status Order ID Comments User Context   06/02/2015  3:13 PM 06/16/2015  5:07 PM DNR [id number]  Marcos Eke, PA-C ED   05/26/2015  3:47 PM 05/28/2015  6:35 PM DNR [id number]  Gwenyth Bender, NP ED   05/26/2015  3:08 PM 05/26/2015  3:47 PM Full Code [id number]  Gwenyth Bender, NP ED   03/04/2012 10:38 PM 03/11/2012  7:35 PM Full Code [id number]  Lonia Farber, MD ED       2. Goals of Care/Additional Recommendations:  Comfort care  Limitations on Scope of Treatment: Full Comfort Care  Psycho-social Needs: Grief/Bereavement Support  3. Symptom Management:      1.  Seizure activity: appreciate neurology recs.  Plan to increase Keppra. Continue versed.  2. Pain: Currently well controlled.  Continue morphine PRN.  total in last 24 hours.  4. Palliative Prophylaxis:   Frequent Pain Assessment  5. Prognosis: Hours - Days  6. Discharge Planning:  Anticipated Hospital Death   Care plan was discussed with family  Thank you for allowing the Palliative Medicine Team to assist in the care of this patient.   Time In: 0850 Time Out: 0920 Total Time 30 Prolonged Time Billed No        Romie Minus, MD  06/17/2015, 9:31 AM  Please contact Palliative Medicine Team phone at [phone] for questions and concerns.

## 2015-06-19 NOTE — Discharge Summary (Signed)
[facility] Death Summary   Patient: Anne Hanna ZOX:[id number]   PCP: Lupita RaiderSHAW,KIMBERLEE, MD DOB: 03/22/1922   Date of admission: 06/04/2015   Date of Death: 2015-11-06    Discharge Diagnoses:  Principal Problem:   Acute encephalopathy Active Problems:   Coronary artery disease   Hypertension   Hypercholesterolemia   CVA (cerebral vascular accident) (HCC)   Seizure (HCC)   Anemia   Cirrhosis (HCC)   Altered mental status   Confusion   Acute respiratory failure with hypercapnia (HCC)   Post-ictal coma (HCC)   Seizures (HCC)  History of present illness: As per the H and P dictated on admission, "Anne BeckmannMary F Hanna is a 82 y.o. female with a history of CAD, HTN, temporal hemorrhagic stroke in 2013, chronic thrombocytopenia, chronic liver cirrhosis, recently hospitalized from 3/8-3/10 due to acute respiratory failure and severe anemia with Fe deficiency requiring transfusion, and possible history of seizures, presenting today with acute mental status changes. She was last well at 5 pm. Daughter found patient unresponsive this morning, and was concerned of left facial droop, which may have improved since. She called EMS. During transport she was not following commands, but was awake. No significant improvement on arrival although is more alert. Per daughter report, no apparent dysuria, frequency, chest pain, worsening shortness of breath or cough, back pain, rash, fevers, cough, headache, neck stiffness. She had increased salivary secretions during MRI today. No recent ETOH or recreational drug. No bleeding issues. No other sick contacts since last hospitalizations. She is compliant with meds, no new medications.  At the ED, CT head was normal. Urine Clear. VSS, A febrile.no apparent signs of infection. Tn 0.57. CBC remarkable for plt 93 at her baseline, no bleeding issues. Sodium is normal, potassium 3.1, creatinine normal at 1.19, glucose 211. LFTs normal. Neuro involved, awaiting  recommendations. MRI brain ordered"  Hospital Course:  Patient was admitted on 06/03/2015, with complaint of confusion, was found to have acute encephalopathy. Initially the acute encephalopathy was thought to be metabolically to UTI and hepatitic encephalopathy as a possibility. Her MRI was negative for any acute ischemia but it did show chronic left subdural hematoma. Patient was also growing Escherichia coli and gram-positive cocci in her urine and therefore antibiotic ceftriaxone was continued. Metabolic panel was otherwise unremarkable. Neurology was consulted. Patient was found to be having seizure-like activity late night on 06/15/2015 with persistent postictal state after that. The patient was given antiepileptic medication as well as IV Ativan for breakthrough seizures. With this the patient's mentation progressively worsened and the patient remained obtunded. Her oxygenation also progressively worsened requiring nonrebreather mask. In the interim patient continued to have intermittent episodes of focal seizures. With progressive worsening family at bedside was consulted and it was decided that the patient should be transitioned to comfort care measures  Summary of her active problems in the hospital is as following. 1. Acute encephalopathy   Likely due to recurrent seizures as well as persistent postictal status with possible postictal coma. Patient was given IV Keppra despite which she continues to have focal seizures. Patient continues to have possible focal seizure described by the family A stat EEG was also consistent with post ictal state with interictal discharges. With this neurology discussed with the patient family, if the patient will get any further antiepileptic medications or the lorazepam she is highly likely require intubation. Critical care was also consulted. After discussing with the critical care and neurology regarding available options and the family has decided to  maintain patient's DO NOT RESUSCITATE status and transition her to comfort protocol. Palliative care was consulted to assist in patient's management. Patient was started on Versed drip at present for seizure control.  2. Acute hypoxic respiratory failure with hypercarbia. Patient has bilateral rhonchi on examination. Chest x-ray does not show any focal pneumonia but patient is likely to have a possibility of aspiration. With this finding and persistent seizure activity it was felt that the patient would more likely be benefit from comfort measures instead of aggressive intervention.  3. coronary artery disease status post CABG. History of essential hypertension. History of CVA. Acute on chronic combined CHF. Ejection fraction of 30-35%. Wall motion abnormality of septal basal inferior wall. Escherichia coli and strep viridans UTI H/o liver cirrhosis.  Likely contributing to patient's rapid worsening of her development of her initial seizure activity.  Patient was pronounced deceased at 02:30 AM on 11-Jul-2015, on call MD was notified who evaluated the patient at bedside.   Procedures and Results:  EEG Preliminary report: Bedside EEG showed right-sided PLEDs   ECHO Study Conclusions  - Left ventricle: Septal apiical mid and basal inferior wall  akinesis The cavity size was moderately dilated. Wall thickness  was increased in a pattern of mild LVH. Systolic function was  moderately to severely reduced. The estimated ejection fraction  was in the range of 30% to 35%. - Mitral valve: Eccentric likely ischemic MR with restricted  posterior leafelt motion There was mild to moderate  regurgitation. - Atrial septum: No defect or patent foramen ovale was identified.   Carotid doppler  Summary: Bilateral: Intimal wall thickening CCA. Mild mixed plaque origin ICA. 1-39% ICA plaquing. Vertebral artery flow is antegrade.  Consultations:  Neurology  Critical  care  Palliative care.  The results of significant diagnostics from this hospitalization (including imaging, microbiology, ancillary and laboratory) are listed below for reference.    Significant Diagnostic Studies: Ct Head Wo Contrast  07/06/2015  CLINICAL DATA:  Altered mental status. Unequal pupils. Left-sided facial droop. EXAM: CT HEAD WITHOUT CONTRAST TECHNIQUE: Contiguous axial images were obtained from the base of the skull through the vertex without intravenous contrast. COMPARISON:  03/04/2012; brain MRI- 03/05/2012 FINDINGS: Examination is degraded due to patient motion artifact and patient's inability to cooperate with instructions. Re- demonstrated advanced atrophy with sulcal prominence centralized volume loss with commensurate ex vacuo dilatation of the ventricular system. Extensive periventricular hypodensities compatible with microvascular ischemic disease. Given background parenchymal abnormalities and patient motion, there is no definitive since CT evidence of acute superimposed large territory infarct. No intraparenchymal or extra-axial mass or hemorrhage. Unchanged size and configuration of the ventricles and basilar cisterns. Mild (approximately 6 mm) of left-to-right midline shift is unchanged since the 02/2012 examination and likely attributable to asymmetric atrophy. Intracranial atherosclerosis. Limited visualization of the paranasal sinuses and mastoid air cells is normal. No air-fluid levels. Regional soft tissues appear normal. Post bilateral cataract surgery. No definitive displaced calvarial fracture. IMPRESSION: Similar findings of advanced atrophy and microvascular ischemic disease without definitive superimposed acute large territory infarct on this motion degraded examination. Electronically Signed   By: Simonne Come M.D.   On: 07-06-15 12:51   Dg Chest Port 1 View  06/16/2015  CLINICAL DATA:  Shallow breathing, pneumonia EXAM: PORTABLE CHEST 1 VIEW COMPARISON:   06/14/2015 FINDINGS: Prior CABG. Biapical scarring. Heart is borderline in size. Stable right basilar atelectasis. No confluent opacity on the left. No effusions. No acute bony abnormality. IMPRESSION: Stable exam with right basilar atelectasis.  Mild cardiomegaly and biapical scarring. Electronically Signed   By: Charlett Nose M.D.   On: 06/16/2015 10:01   Dg Chest Port 1 View  06/14/2015  CLINICAL DATA:  Fever for 1 day, history coronary artery disease, stroke, cirrhosis, hypertension EXAM: PORTABLE CHEST 1 VIEW COMPARISON:  Portable exam 1652 hours compared to 05/26/2015 FINDINGS: Enlargement of cardiac silhouette post CABG. Mediastinal contours and pulmonary vascularity normal. Atherosclerotic calcification and elongation of thoracic aorta. Pleuroparenchymal opacities at BILATERAL lung apices LEFT greater than RIGHT unchanged, likely postinflammatory scarring. Eventration of RIGHT diaphragm unchanged. No definite acute infiltrate, pleural effusion or pneumothorax. Minimal RIGHT basilar atelectasis. Bones demineralized with thoracolumbar scoliosis. IMPRESSION: Enlargement of cardiac silhouette post CABG. Biapical scarring and minimal RIGHT basilar atelectasis. Electronically Signed   By: Ulyses Southward M.D.   On: 06/14/2015 17:33   Dg Chest Portable 1 View  05/26/2015  CLINICAL DATA:  Weakness and shortness of breath for 4 days EXAM: PORTABLE CHEST 1 VIEW COMPARISON:  03/11/2012 FINDINGS: Previous median sternotomy and CABG procedure. There are small bilateral pleural effusions. Advanced coarsened interstitial changes of COPD identified. No superimposed airspace consolidation. IMPRESSION: 1. Mild CHF. Electronically Signed   By: Signa Kell M.D.   On: 05/26/2015 15:14   Mr Brain Ltd W/o Cm  06/14/2015  CLINICAL DATA:  Altered mental status, unresponsive this morning. LEFT facial droop. History of stroke, seizures, cirrhosis, hypertension and thrombocytopenia. EXAM: MRI HEAD WITHOUT CONTRAST TECHNIQUE: Axial  and coronal diffusion weighted imaging of the brain and surrounding structures were obtained without intravenous contrast. COMPARISON:  CT head 05/25/2015 and MRI of the brain March 05, 2012 FINDINGS: Per technologist note was unable to tolerate further imaging, on sedation. No reduced diffusion to suggest acute ischemia. Trace likely chronic LEFT holohemispheric subdural hematoma though new from prior MRI. Moderate to severe ventriculomegaly on the basis of global parenchymal brain volume loss. IMPRESSION: Limited diffusion weighted imaging MRI of the brain: No acute ischemia. Trace, likely chronic LEFT subdural hematoma. Electronically Signed   By: Awilda Metro M.D.   On: 06/14/2015 05:34   US Abdomen Limited Ruq  05/27/2015  CLINICAL DATA:  Hepatic cirrhosis. EXAM: US ABDOMEN LIMITED - RIGHT UPPER QUADRANT COMPARISON:  CT scan of January 17, 2013. FINDINGS: Gallbladder: Gallstones are noted. No significant gallbladder wall thickening or pericholecystic fluid is noted. No sonographic Murphy's sign is noted. Common bile duct: Diameter: 3.4 mm which is within normal limits. Liver: Nodular hepatic margins are noted with heterogeneous echotexture of hepatic parenchyma consistent with hepatic cirrhosis. Complex cystic structure measuring 3.2 x 2.5 x 2.3 cm is noted in the right hepatic lobe with peripheral calcifications and possible septations which corresponds to abnormality seen on prior CT scan. IMPRESSION: Cholelithiasis without evidence of cholecystitis. Findings consistent with hepatic cirrhosis. 3.2 cm complex cystic structure seen in right hepatic lobe with peripheral calcifications and possible septations which corresponds to abnormality seen on prior CT scan. Given the lack of significant change in size, it is felt to be most likely benign. Electronically Signed   By: [name], M.D.   On: 05/27/2015 07:54    Microbiology: Recent Results (from the past 240 hour(s))  Culture, Urine      Status: None   Collection Time: 18-Jun-2015 11:30 AM  Result Value Ref Range Status   Specimen Description URINE, RANDOM  Final   Special Requests ADDED 2120  Final   Culture   Final    >=100,000 COLONIES/mL ESCHERICHIA COLI >=100,000 COLONIES/mL VIRIDANS  STREPTOCOCCUS    Report Status 06/17/2015 FINAL  Final   Organism ID, Bacteria ESCHERICHIA COLI  Final      Susceptibility   Escherichia coli - MIC*    AMPICILLIN <=2 SENSITIVE Sensitive     CEFAZOLIN <=4 SENSITIVE Sensitive     CEFTRIAXONE <=1 SENSITIVE Sensitive     CIPROFLOXACIN <=0.25 SENSITIVE Sensitive     GENTAMICIN <=1 SENSITIVE Sensitive     IMIPENEM <=0.25 SENSITIVE Sensitive     NITROFURANTOIN <=16 SENSITIVE Sensitive     TRIMETH/SULFA <=20 SENSITIVE Sensitive     AMPICILLIN/SULBACTAM <=2 SENSITIVE Sensitive     PIP/TAZO <=4 SENSITIVE Sensitive     * >=100,000 COLONIES/mL ESCHERICHIA COLI     Labs: CBC:  Recent Labs Lab 06/19/15 1047 19-Jun-2015 1049 06/14/15 0637 06/15/15 0308 06/16/15 0300  WBC  --  3.8* 4.8 3.6* 2.9*  NEUTROABS  --  3.2  --   --   --   HGB 13.9 11.9* 11.8* 12.0 12.1  HCT 41.0 37.1 36.3 35.5* 35.8*  MCV  --  81.0 80.1 80.0 79.9  PLT  --  93* 89* 92* 81*   Basic Metabolic Panel:  Recent Labs Lab 19-Jun-2015 1047 2015-06-19 1049 06/14/15 0637 06/15/15 0308 06/16/15 0300  NA 134* 135 137 136 136  K 3.1* 3.1* 3.1* 3.1* 3.5  CL 97* 99* 101 101 102  CO2  --  21* 24 22 23   GLUCOSE 206* 211* 82 94 78  BUN 21* 19 18 16 16   CREATININE 0.90 1.19* 0.93 0.89 0.84  CALCIUM  --  9.6 9.2 8.9 8.7*   Liver Function Tests:  Recent Labs Lab 06-19-15 1049 06/14/15 0637  AST 46* 63*  ALT 22 24  ALKPHOS 64 50  BILITOT 0.7 1.2  PROT 6.8 6.0*  ALBUMIN 3.9 3.4*   No results for input(s): LIPASE, AMYLASE in the last 168 hours.  Recent Labs Lab Jun 19, 2015 1555 06/14/15 1157 06/16/15 0300  AMMONIA 47* 54* 13   Cardiac Enzymes: No results for input(s): CKTOTAL, CKMB, CKMBINDEX, TROPONINI in the  last 168 hours. BNP (last 3 results)  Recent Labs  05/26/15 2211  BNP 1485.7*   CBG:  Recent Labs Lab 06/16/15 0445  GLUCAP 120*   Time spent: 20 minutes  Signed:  Clayson Riling  [facility] 05/26/2015, 8:10 AM

## 2015-06-19 NOTE — Progress Notes (Signed)
Patient passed away at 0230, MD called and death certificate singed, family notified Anne Hanna(Linda, Ranell Patrickorris), [place] donors notified. Bed placed has been called. At family request, funeral home would pick the body up from [id number]. Family still in room [address]22 with the body.

## 2015-06-19 DEATH — deceased

## 2015-11-04 ENCOUNTER — Ambulatory Visit: Payer: Medicare Other | Admitting: Nurse Practitioner

## 2017-04-07 IMAGING — CR DG CHEST 1V PORT
1 series · 1 of 1 positions shown · non-contrast
Comparison: 03/11/2012

CLINICAL DATA: Weakness and shortness of breath for 4 days

EXAM:
PORTABLE CHEST 1 VIEW

[AP]
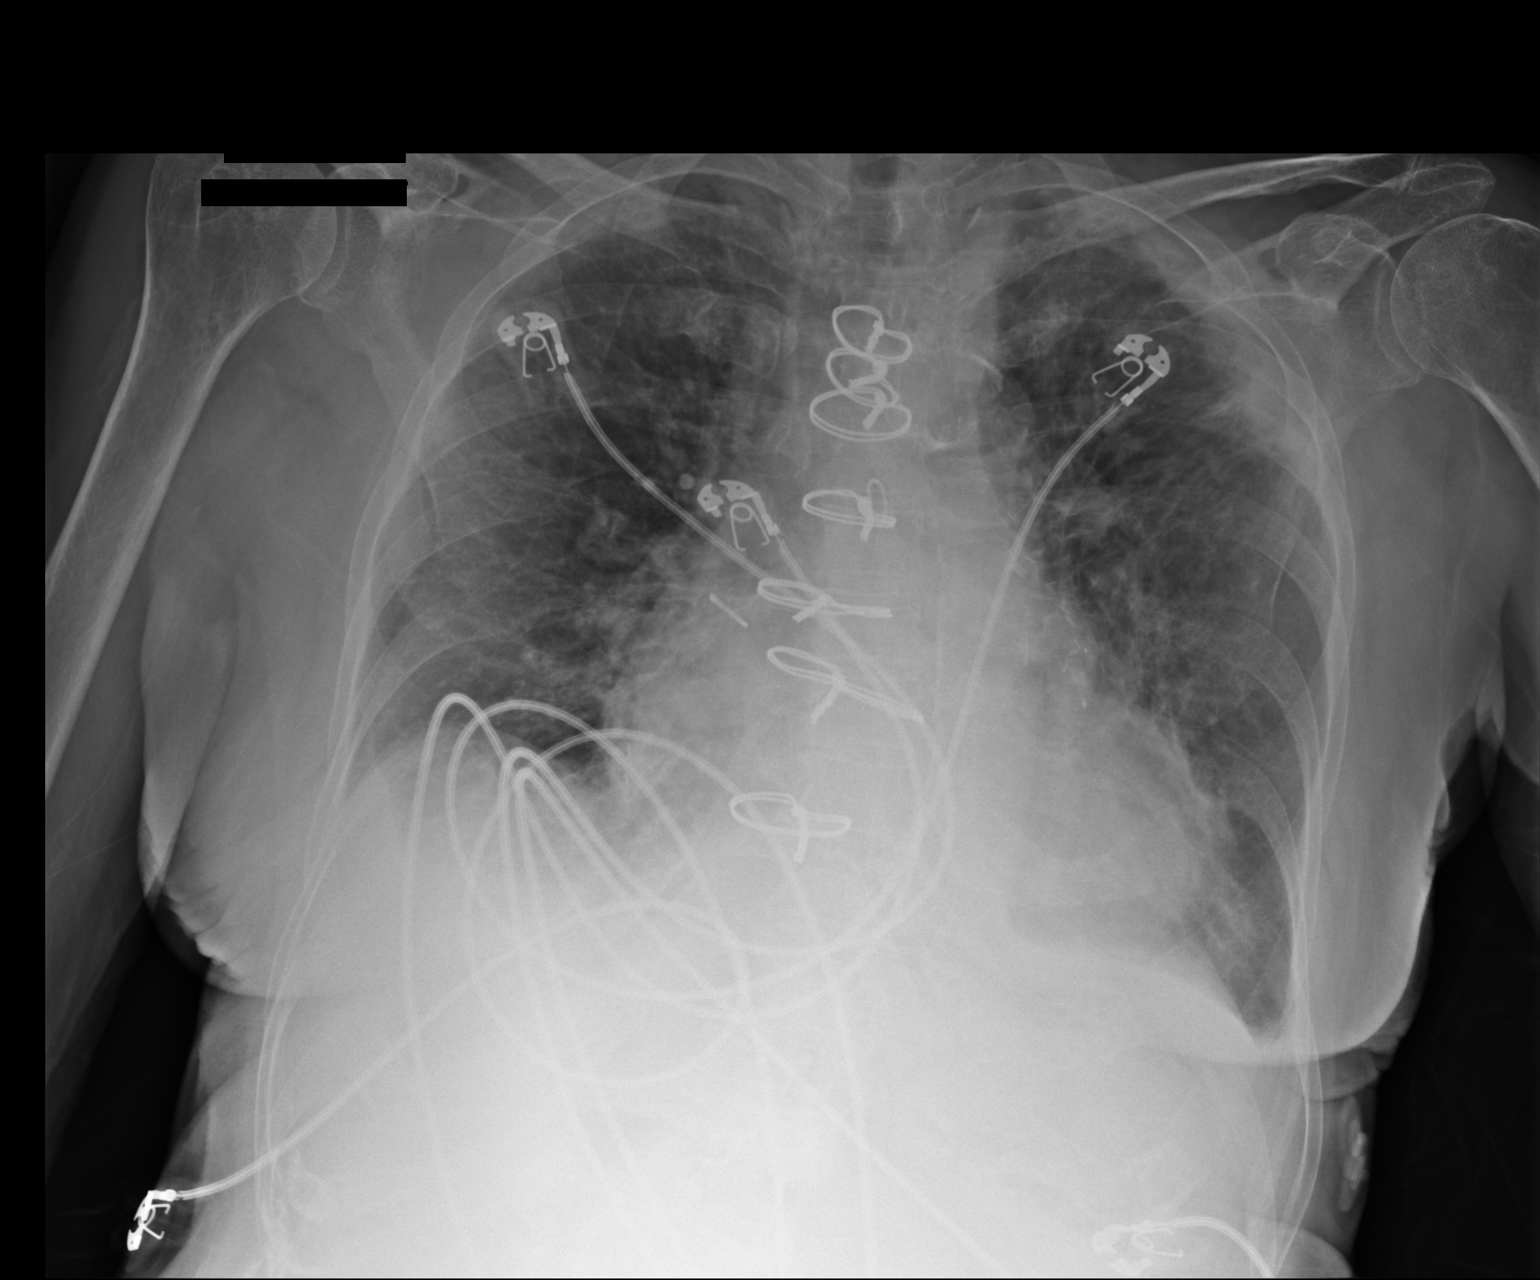

[1 of 1 positions shown; findings below may reference images not displayed]

FINDINGS: Previous median sternotomy and CABG procedure. There are small
bilateral pleural effusions. Advanced coarsened interstitial changes
of COPD identified. No superimposed airspace consolidation.
IMPRESSION: 1. Mild CHF.
